# Patient Record
Sex: Female | Born: 1980 | Race: White | Hispanic: No | Marital: Married | Smoking: Former smoker
Health system: Southern US, Community
[De-identification: ages and names within clinical notes are randomized; demographics above are authoritative.]

## PROBLEM LIST (undated history)

## (undated) DIAGNOSIS — K529 Noninfective gastroenteritis and colitis, unspecified: Secondary | ICD-10-CM

## (undated) DIAGNOSIS — R011 Cardiac murmur, unspecified: Secondary | ICD-10-CM

## (undated) DIAGNOSIS — N6310 Unspecified lump in the right breast, unspecified quadrant: Secondary | ICD-10-CM

## (undated) DIAGNOSIS — L723 Sebaceous cyst: Secondary | ICD-10-CM

## (undated) DIAGNOSIS — K219 Gastro-esophageal reflux disease without esophagitis: Secondary | ICD-10-CM

## (undated) DIAGNOSIS — M797 Fibromyalgia: Secondary | ICD-10-CM

## (undated) DIAGNOSIS — L0292 Furuncle, unspecified: Principal | ICD-10-CM

## (undated) HISTORY — PX: COLPOSCOPY: SHX161

## (undated) HISTORY — DX: Furuncle, unspecified: L02.92

## (undated) HISTORY — DX: Fibromyalgia: M79.7

## (undated) HISTORY — DX: Sebaceous cyst: L72.3

## (undated) HISTORY — PX: WISDOM TOOTH EXTRACTION: SHX21

## (undated) HISTORY — PX: TUBAL LIGATION: SHX77

## (undated) HISTORY — DX: Cardiac murmur, unspecified: R01.1

---
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
## 2001-02-13 ENCOUNTER — Emergency Department (HOSPITAL_COMMUNITY): Admission: EM | Admit: 2001-02-13 | Discharge: 2001-02-13 | Payer: Self-pay | Admitting: Emergency Medicine

## 2001-05-22 ENCOUNTER — Emergency Department (HOSPITAL_COMMUNITY): Admission: EM | Admit: 2001-05-22 | Discharge: 2001-05-22 | Payer: Self-pay | Admitting: *Deleted

## 2001-12-22 ENCOUNTER — Emergency Department (HOSPITAL_COMMUNITY): Admission: EM | Admit: 2001-12-22 | Discharge: 2001-12-22 | Payer: Self-pay | Admitting: *Deleted

## 2001-12-22 ENCOUNTER — Encounter: Payer: Self-pay | Admitting: Emergency Medicine

## 2002-04-27 ENCOUNTER — Emergency Department (HOSPITAL_COMMUNITY): Admission: EM | Admit: 2002-04-27 | Discharge: 2002-04-28 | Payer: Self-pay | Admitting: Emergency Medicine

## 2003-03-22 ENCOUNTER — Emergency Department (HOSPITAL_COMMUNITY): Admission: EM | Admit: 2003-03-22 | Discharge: 2003-03-22 | Payer: Self-pay | Admitting: Emergency Medicine

## 2003-03-22 ENCOUNTER — Encounter: Payer: Self-pay | Admitting: Emergency Medicine

## 2003-03-24 ENCOUNTER — Emergency Department (HOSPITAL_COMMUNITY): Admission: EM | Admit: 2003-03-24 | Discharge: 2003-03-24 | Payer: Self-pay | Admitting: Emergency Medicine

## 2003-07-22 ENCOUNTER — Emergency Department (HOSPITAL_COMMUNITY): Admission: EM | Admit: 2003-07-22 | Discharge: 2003-07-22 | Payer: Self-pay | Admitting: Emergency Medicine

## 2003-09-20 ENCOUNTER — Other Ambulatory Visit: Admission: RE | Admit: 2003-09-20 | Discharge: 2003-09-20 | Payer: Self-pay | Admitting: Internal Medicine

## 2003-09-20 ENCOUNTER — Other Ambulatory Visit: Admission: RE | Admit: 2003-09-20 | Discharge: 2003-09-20 | Payer: Self-pay | Admitting: Obstetrics and Gynecology

## 2004-01-14 ENCOUNTER — Emergency Department (HOSPITAL_COMMUNITY): Admission: EM | Admit: 2004-01-14 | Discharge: 2004-01-14 | Payer: Self-pay | Admitting: Emergency Medicine

## 2004-09-15 ENCOUNTER — Emergency Department (HOSPITAL_COMMUNITY): Admission: EM | Admit: 2004-09-15 | Discharge: 2004-09-15 | Payer: Self-pay | Admitting: Emergency Medicine

## 2005-04-23 ENCOUNTER — Other Ambulatory Visit: Admission: RE | Admit: 2005-04-23 | Discharge: 2005-04-23 | Payer: Self-pay | Admitting: Obstetrics & Gynecology

## 2005-10-19 ENCOUNTER — Emergency Department (HOSPITAL_COMMUNITY): Admission: EM | Admit: 2005-10-19 | Discharge: 2005-10-19 | Payer: Self-pay | Admitting: Emergency Medicine

## 2005-10-19 IMAGING — CR DG FOOT COMPLETE 3+V*L*
3 series · 3 of 3 positions shown · non-contrast
Comparison: none

CLINICAL DATA: Shut foot on door.  Pain across top of toes. 
 LEFT [M2] VIEW:
 Three views of the left foot show no acute fracture.  Alignment is normal.

[t foot ap left *]
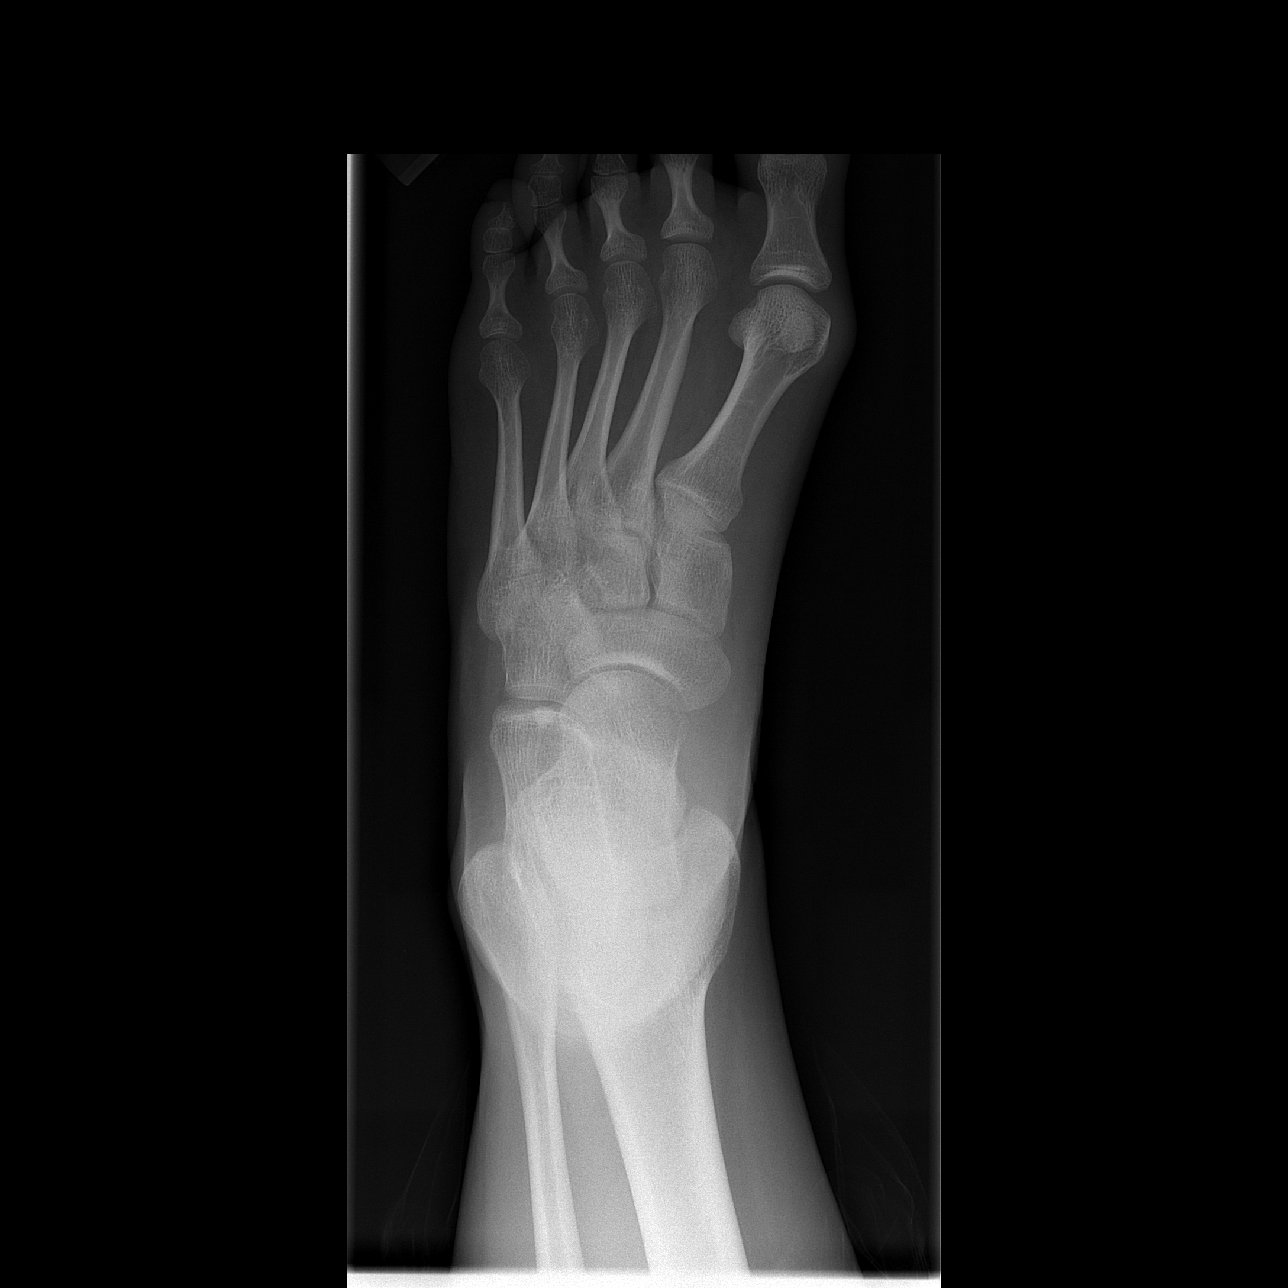

[t foot oblique left]
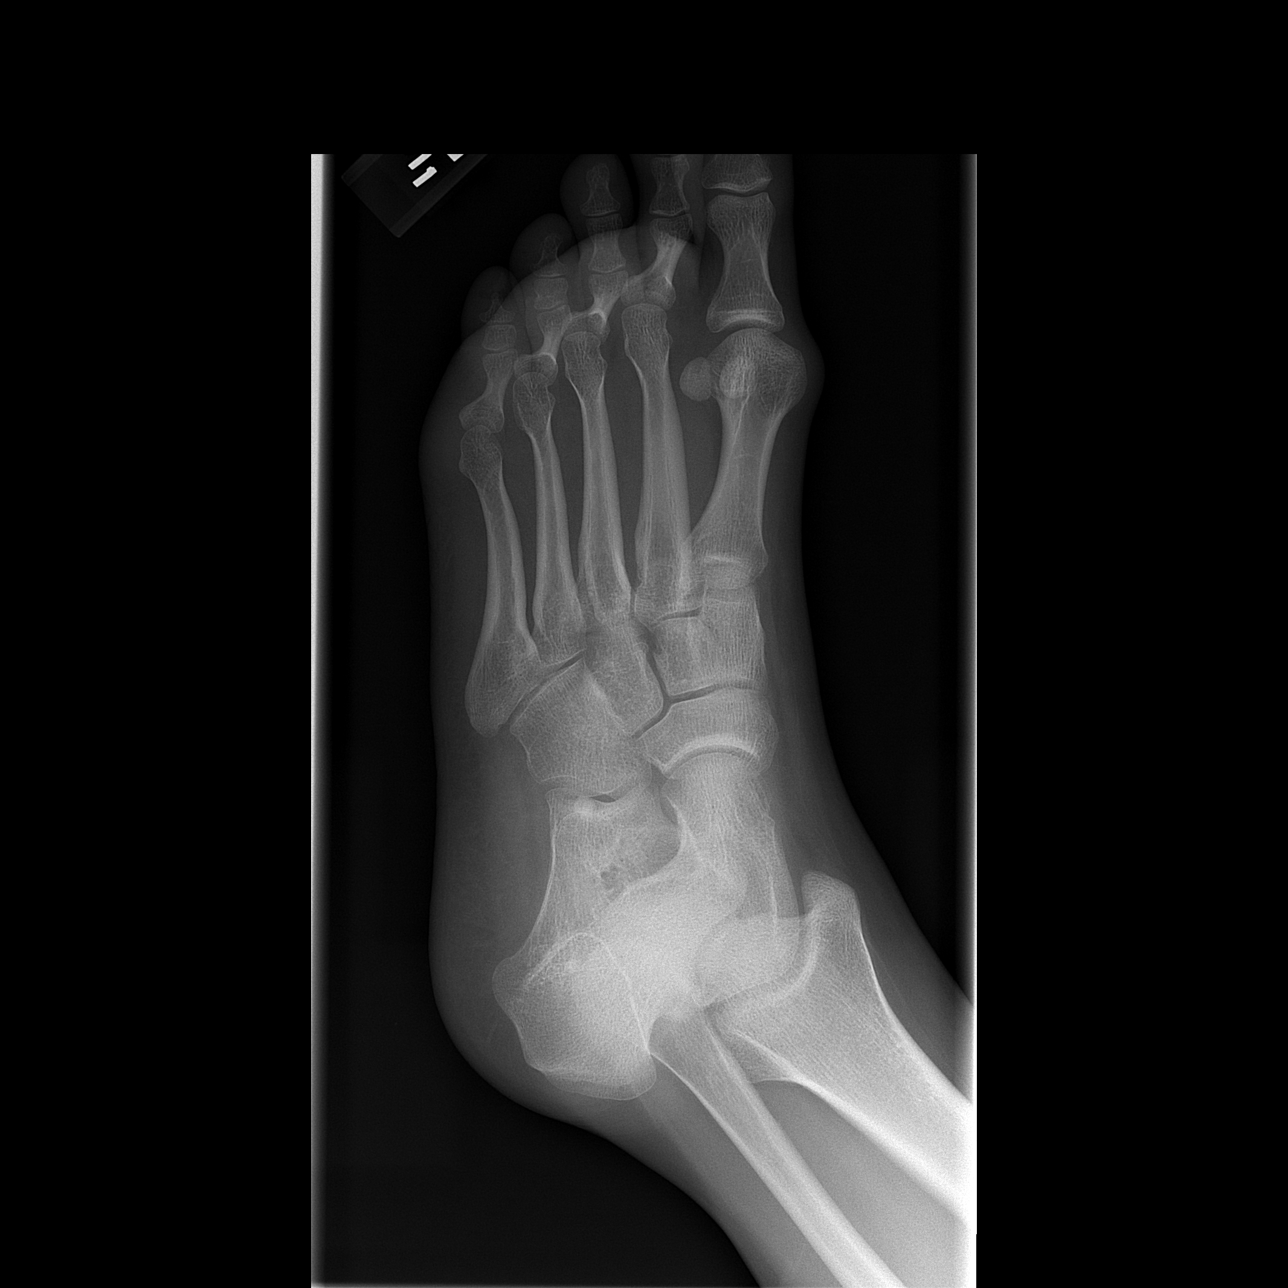

[t foot lat left]
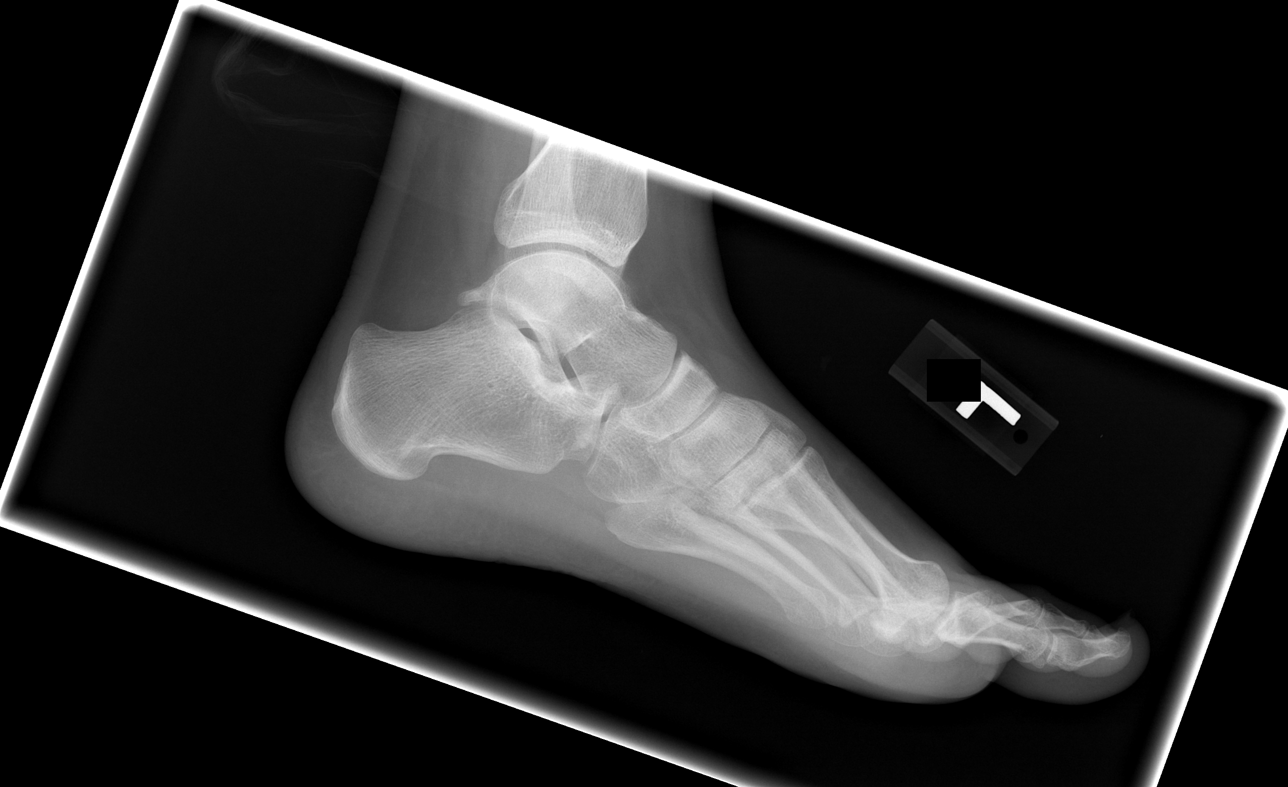

[3 of 3 positions shown; findings below may reference images not displayed]

IMPRESSION: No acute bony abnormality.

## 2006-05-11 ENCOUNTER — Emergency Department (HOSPITAL_COMMUNITY): Admission: EM | Admit: 2006-05-11 | Discharge: 2006-05-11 | Payer: Self-pay | Admitting: Emergency Medicine

## 2006-05-11 IMAGING — CT CT HEAD W/O CM
1 series · 16 of 30 positions shown, 20 images · IV contrast (agent unspecified)
Comparison: none

CLINICAL DATA: Severe headache.  
 HEAD CT WITHOUT CONTRAST:
TECHNIQUE: Contiguous axial images were obtained from the base of the skull through the vertex according to standard protocol without contrast.

[Series 2: head_seq 4.5 h45s st · axial · 0.43mm/px · z∈[+1009,+1135]mm · 16 of 32 slices shown, 20 images]
[im 2/32  brain]
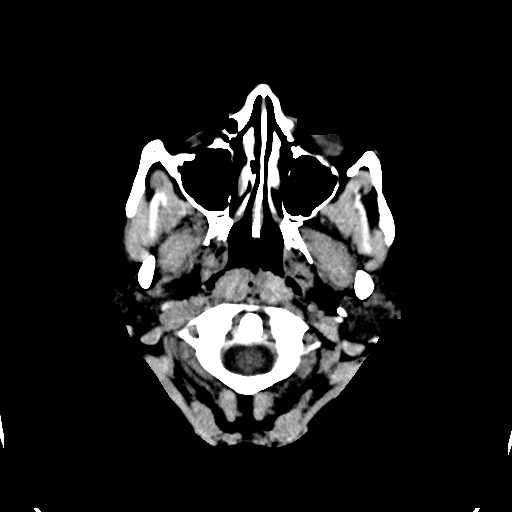
[im 2/32  bone]
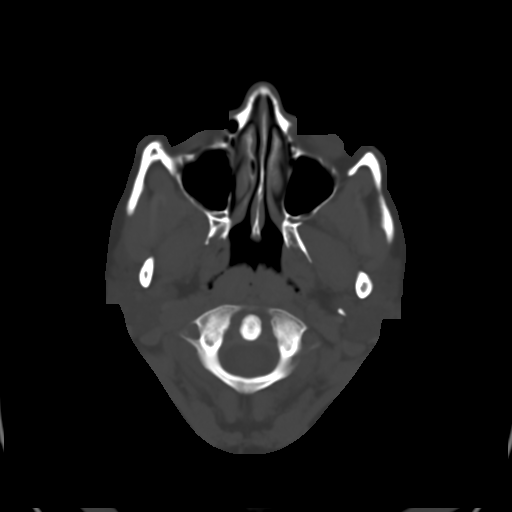
[im 4/32  brain]
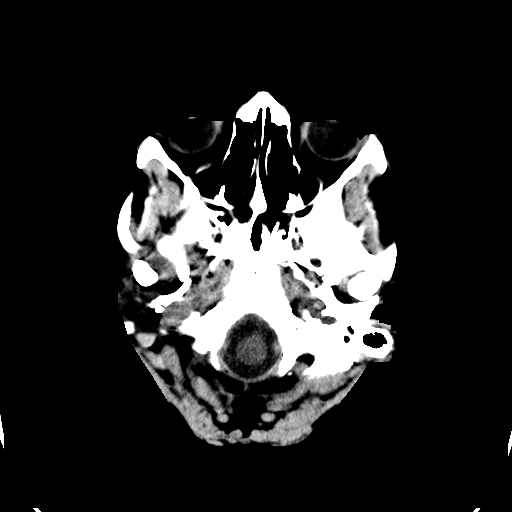
[im 6/32  brain]
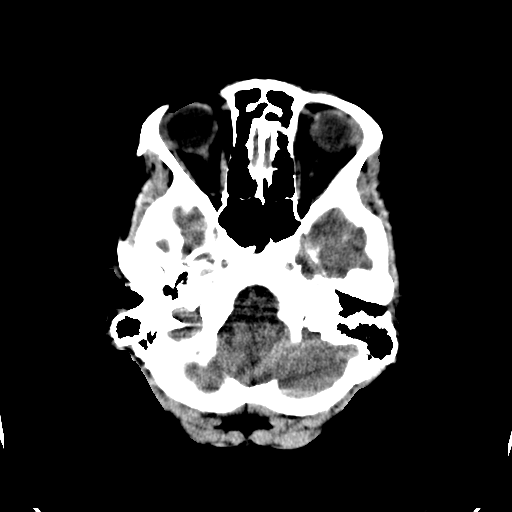
[im 8/32  brain]
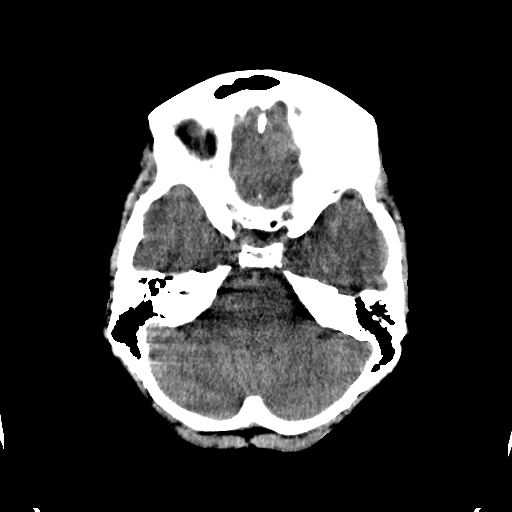
[im 9/32  brain]
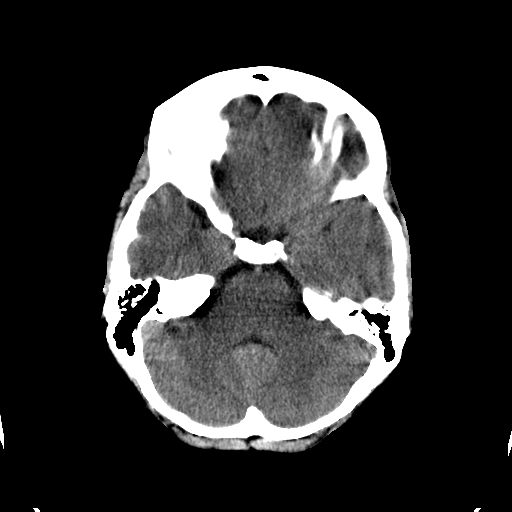
[im 9/32  bone]
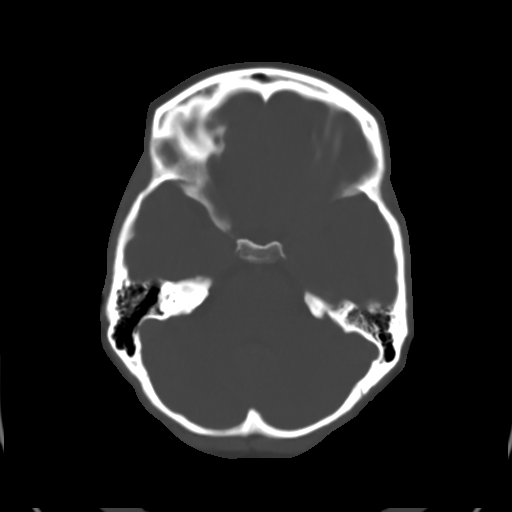
[im 11/32  brain]
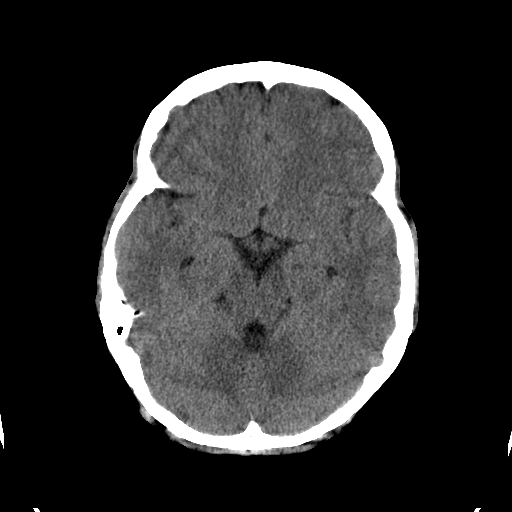
[im 13/32  brain]
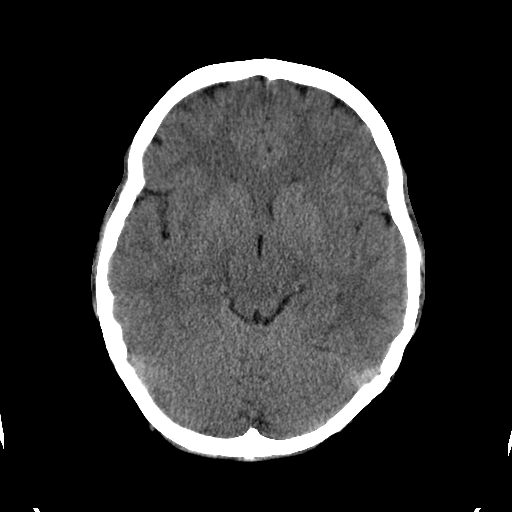
[im 15/32  brain]
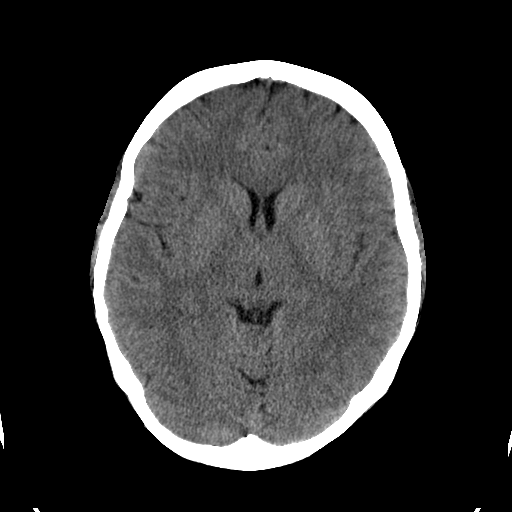
[im 17/32  brain]
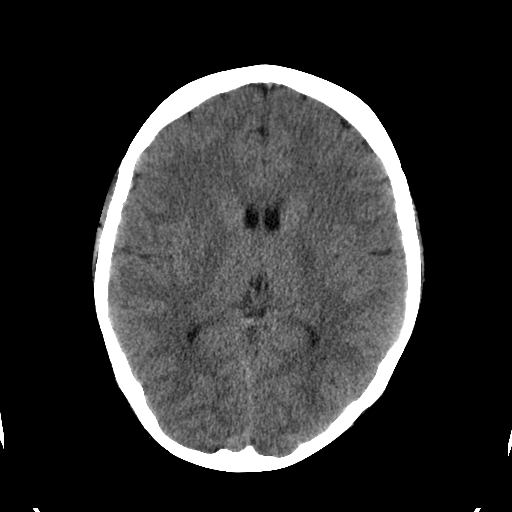
[im 17/32  bone]
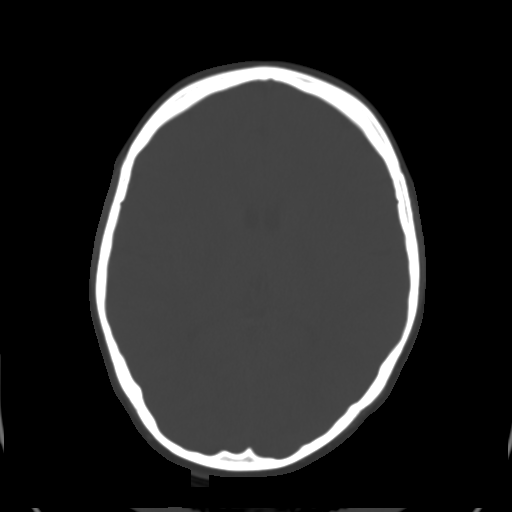
[im 19/32  brain]
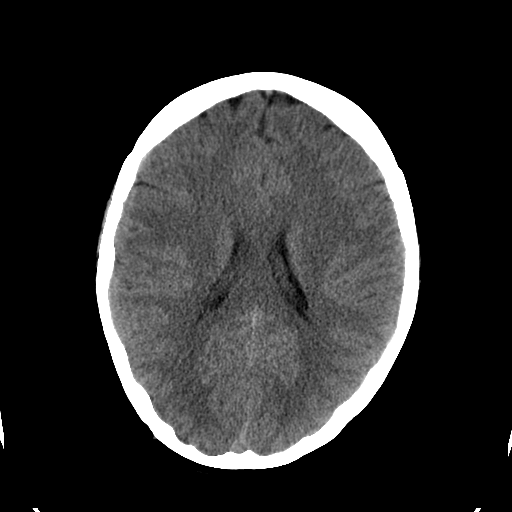
[im 21/32  brain]
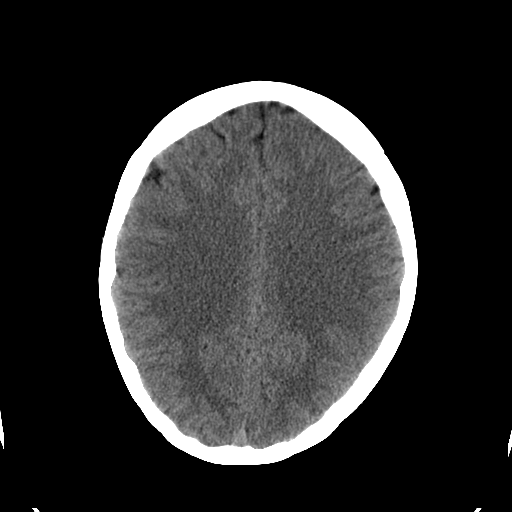
[im 23/32  brain]
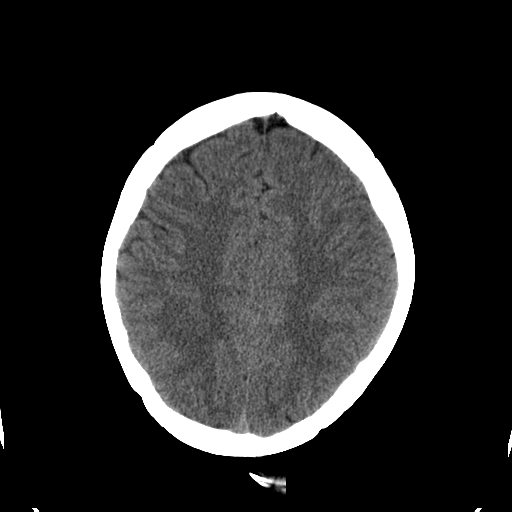
[im 24/32  brain]
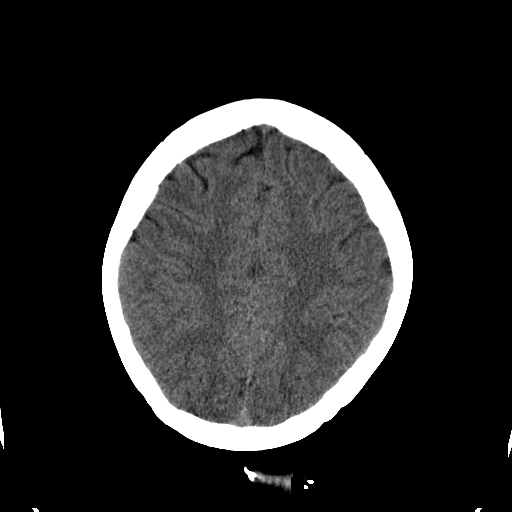
[im 24/32  bone]
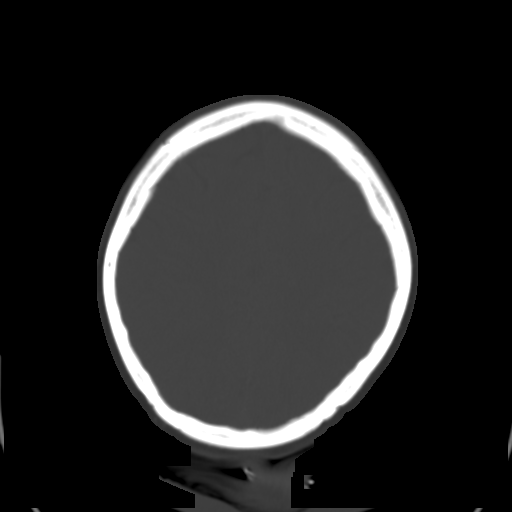
[im 26/32  brain]
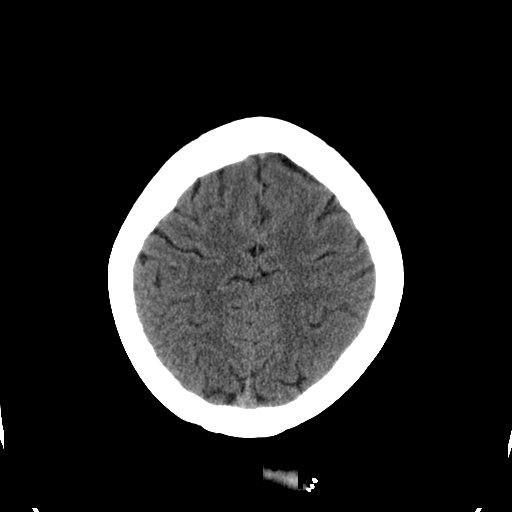
[im 28/32  brain]
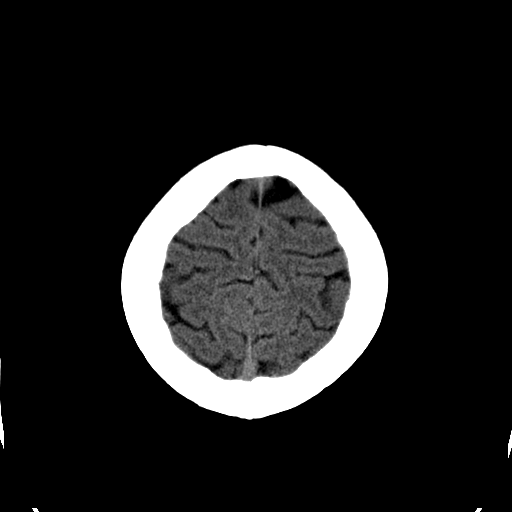
[im 30/32  brain]
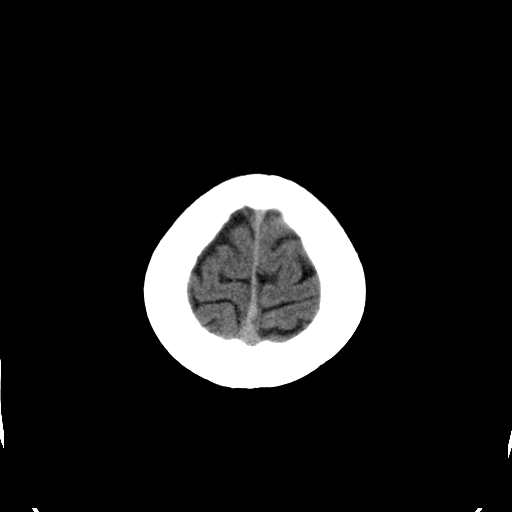

[16 of 30 positions shown; findings below may reference images not displayed]

FINDINGS: There is no evidence of acute intracranial abnormality, including mass or mass effect, hydrocephalus, extraaxial fluid collection, midline shift, hemorrhage, or infarct.  Acute infarct may be occult on CT.  The visualized bony calvarium and paranasal sinuses are unremarkable.
IMPRESSION: Unremarkable CT of the head without contrast.

## 2006-08-02 ENCOUNTER — Emergency Department (HOSPITAL_COMMUNITY): Admission: EM | Admit: 2006-08-02 | Discharge: 2006-08-02 | Payer: Self-pay | Admitting: Emergency Medicine
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
## 2006-08-02 IMAGING — CT CT HEAD W/O CM
1 of 2 series · 16 of 30 positions shown, 20 images · non-contrast
Comparison: [DATE].

CLINICAL DATA: Headache, trauma.
 HEAD CT WITHOUT CONTRAST ? [DATE]:
TECHNIQUE: Contiguous axial images were obtained from the base of the skull through the vertex according to standard protocol without contrast.

[Series 3: recon 2: brain · axial · 0.47mm/px · z∈[+136,+262]mm · 16 of 56 slices shown, 20 images]
[im 3/56  brain]
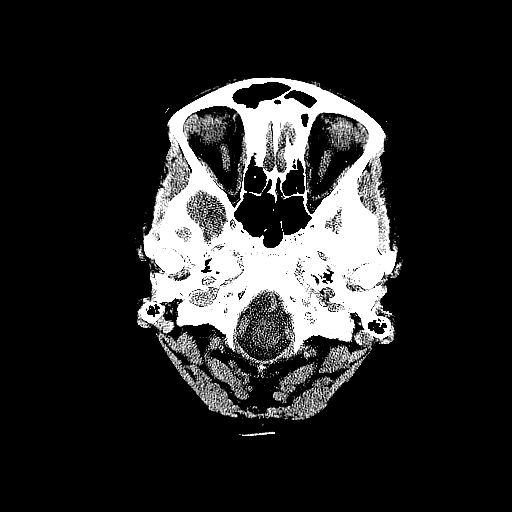
[im 3/56  bone]
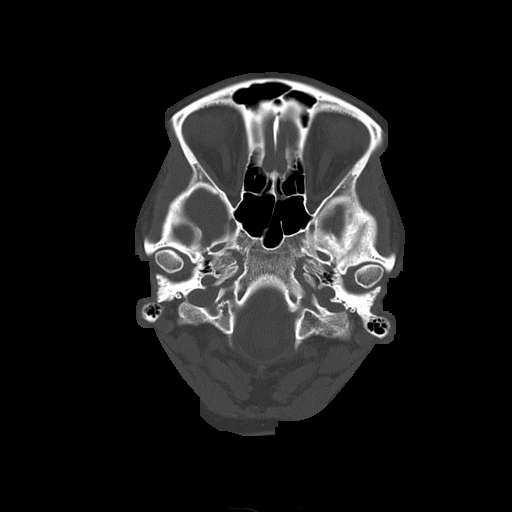
[im 6/56  brain]
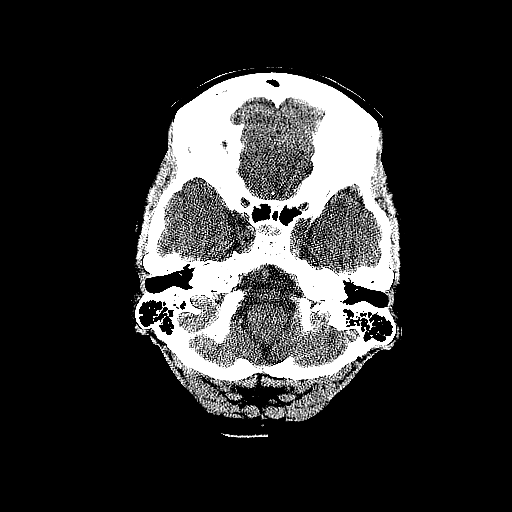
[im 9/56  brain]
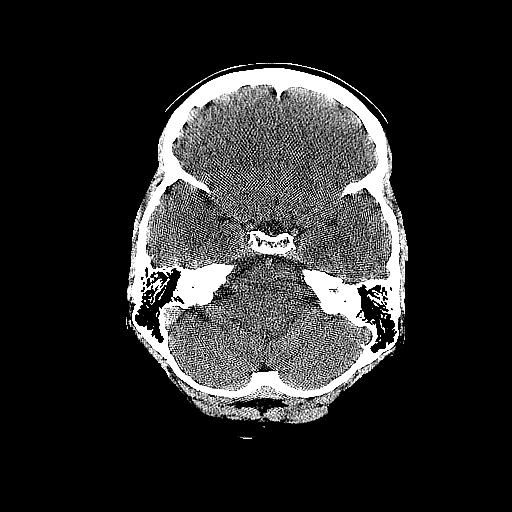
[im 12/56  brain]
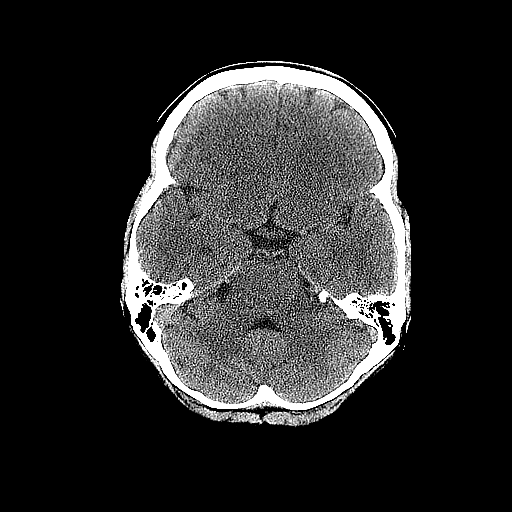
[im 18/56  brain]
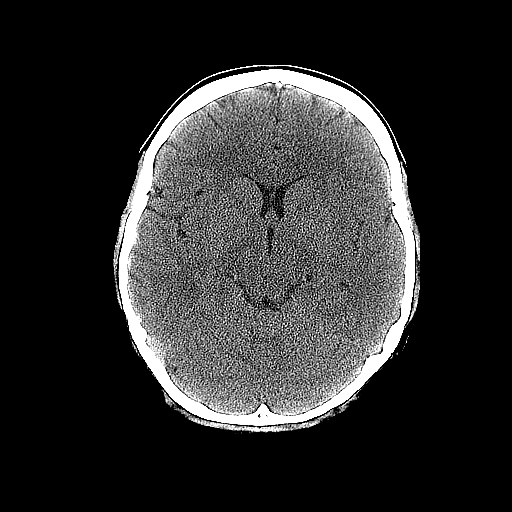
[im 18/56  bone]
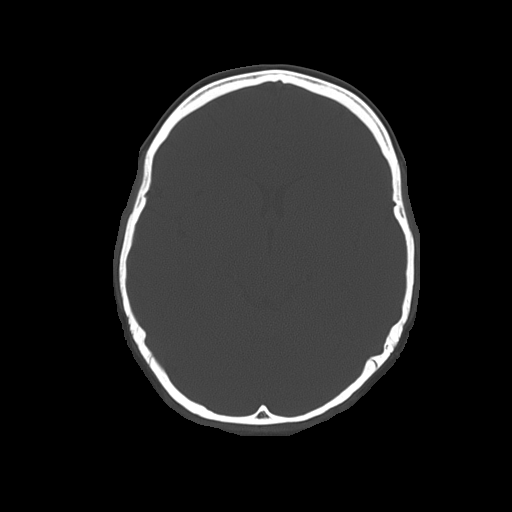
[im 21/56  brain]
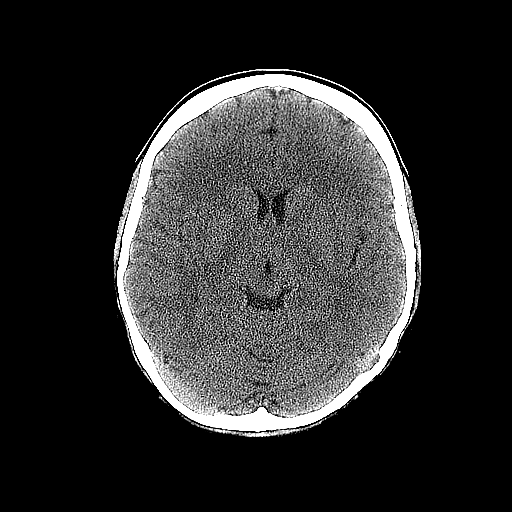
[im 24/56  brain]
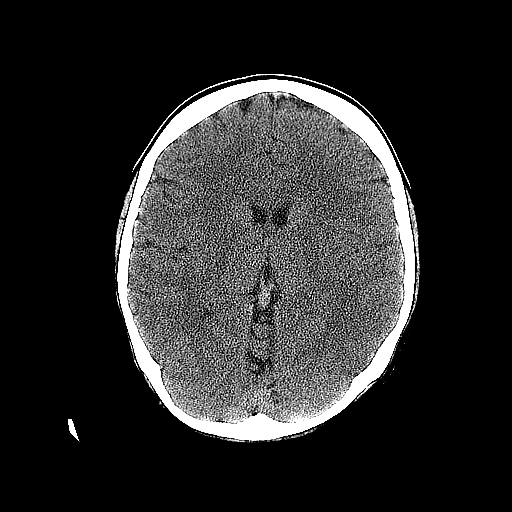
[im 27/56  brain]
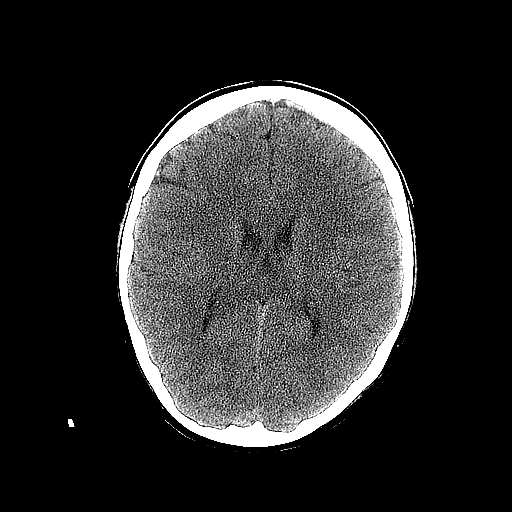
[im 29/56  brain]
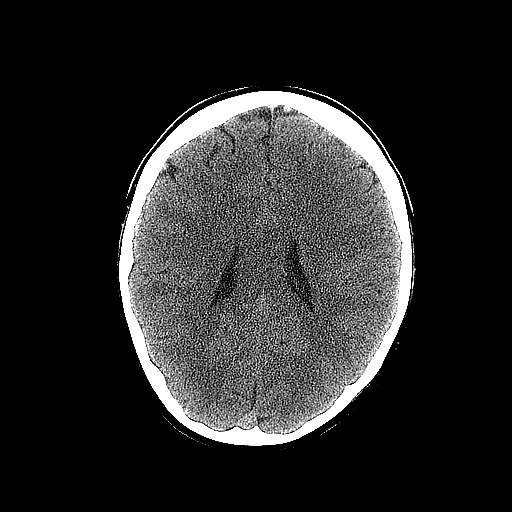
[im 29/56  bone]
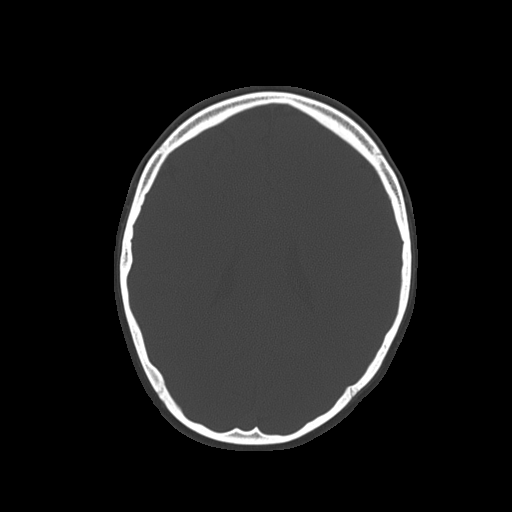
[im 32/56  brain]
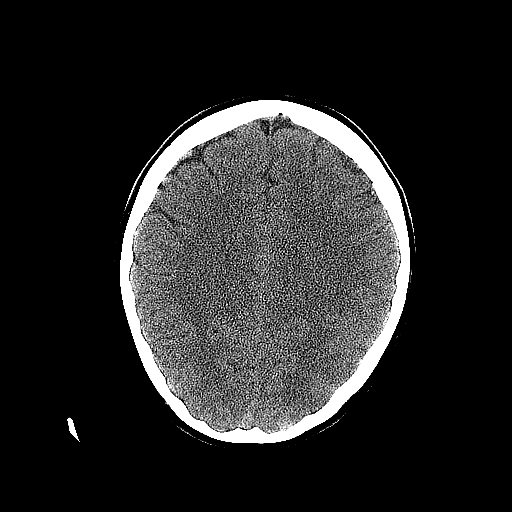
[im 35/56  brain]
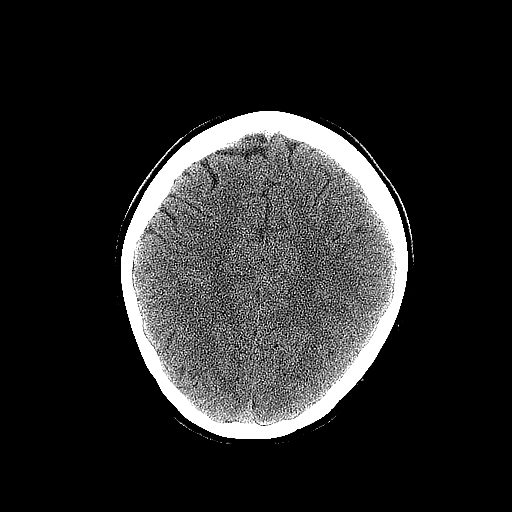
[im 38/56  brain]
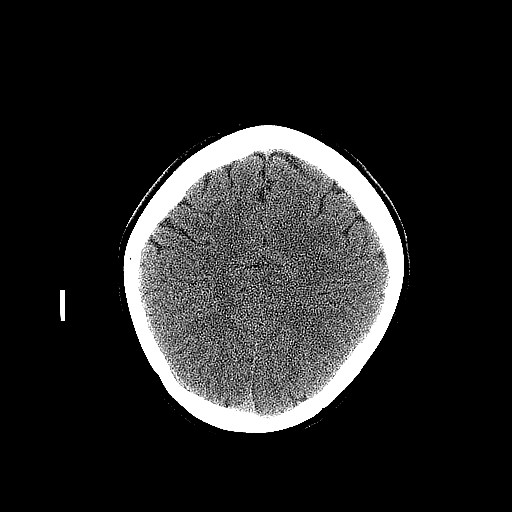
[im 44/56  brain]
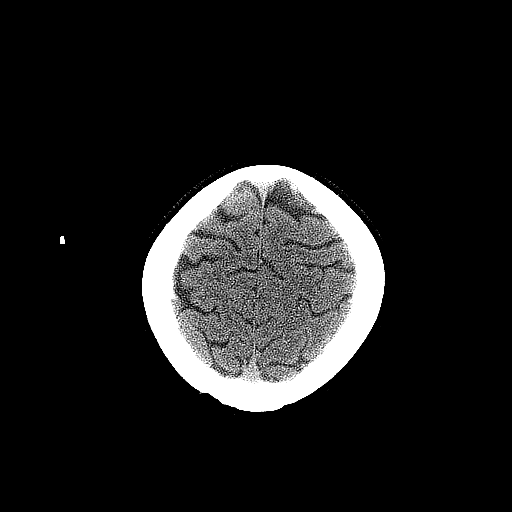
[im 44/56  bone]
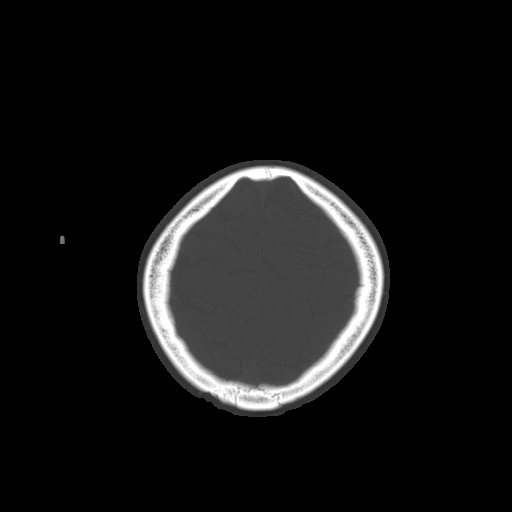
[im 47/56  brain]
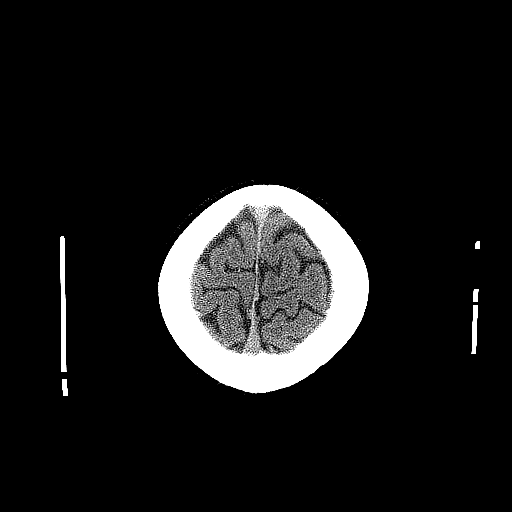
[im 50/56  brain]
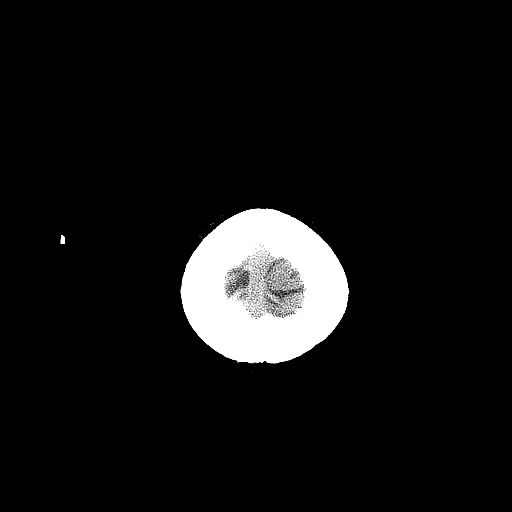
[im 53/56  brain]
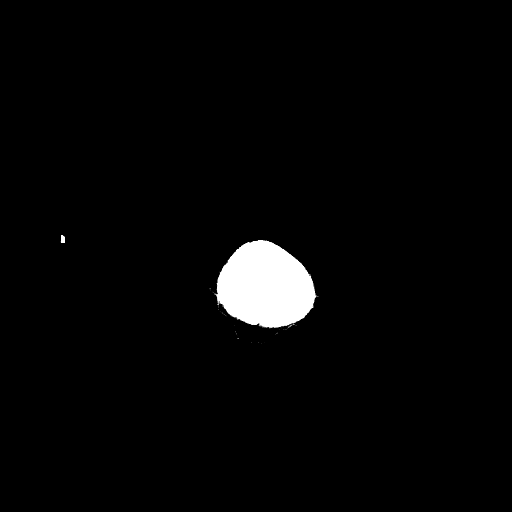

[16 of 30 positions shown; findings below may reference images not displayed]

FINDINGS: Mastoid air cells and sinuses clear.  There is no calvarial or basilar skull fracture appreciated. 
 Cerebral and cerebellar parenchyma symmetric and normal in appearance.  Negative for acute intracranial hemorrhage.
IMPRESSION: CT brain is negative for intracranial hemorrhage or edema.  Negative for fracture.

## 2007-02-26 IMAGING — US US PELVIS COMPLETE
1 series · 14 of 25 positions shown · non-contrast
Comparison: NONE

CLINICAL DATA: Pelvic pain. 

PELVIC ULTRASOUND
TECHNIQUE: Transabdominal and transvaginal images were obtained.

[Series 1: us pelvic · 0.35mm/px · 14 of 50 slices shown]
[im 1/50]
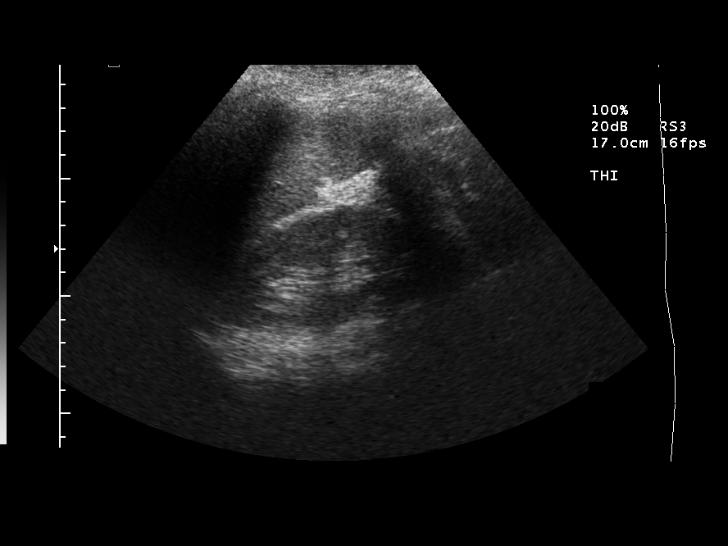
[im 5/50]
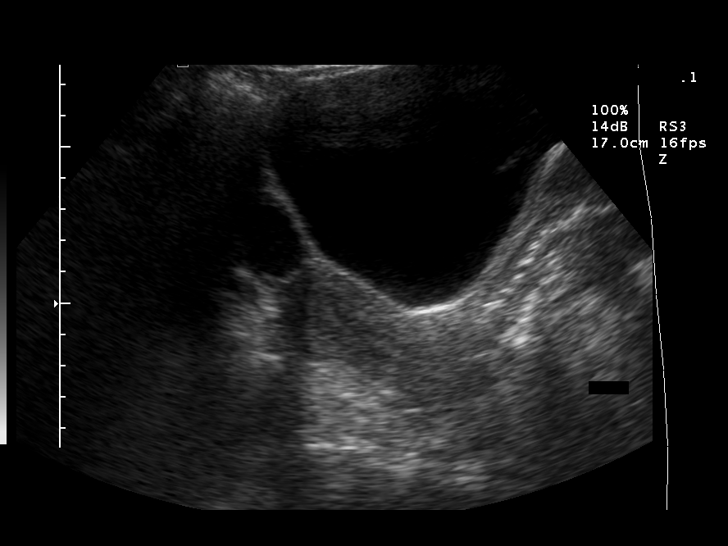
[im 9/50]
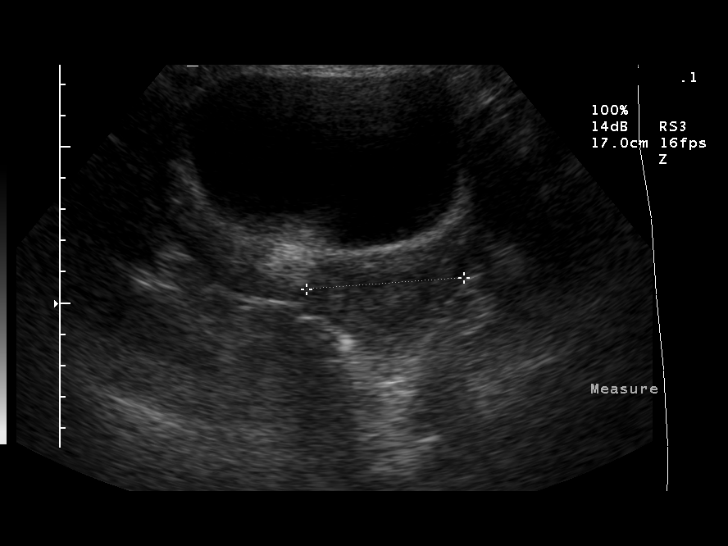
[im 13/50]
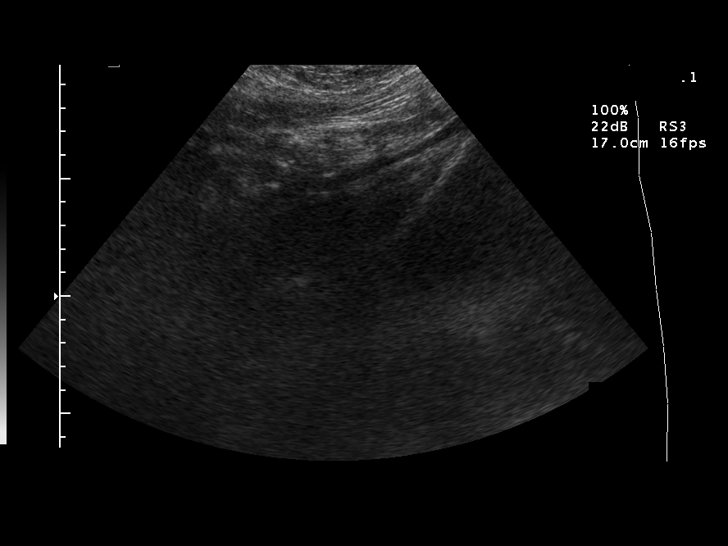
[im 17/50]
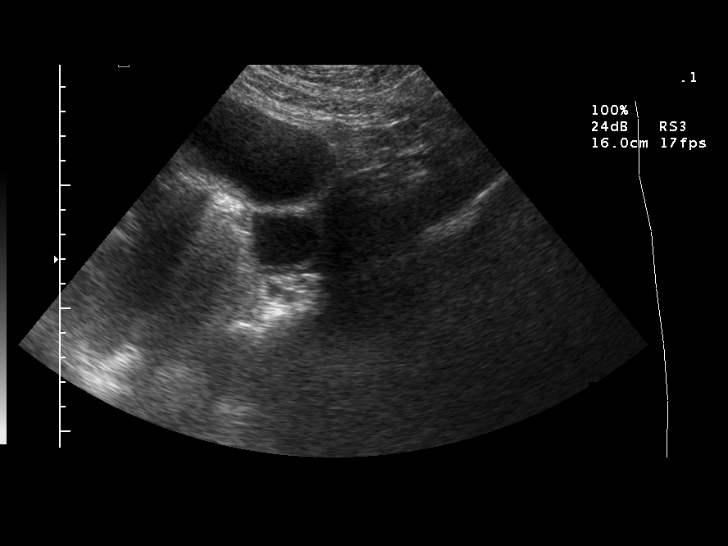
[im 19/50]
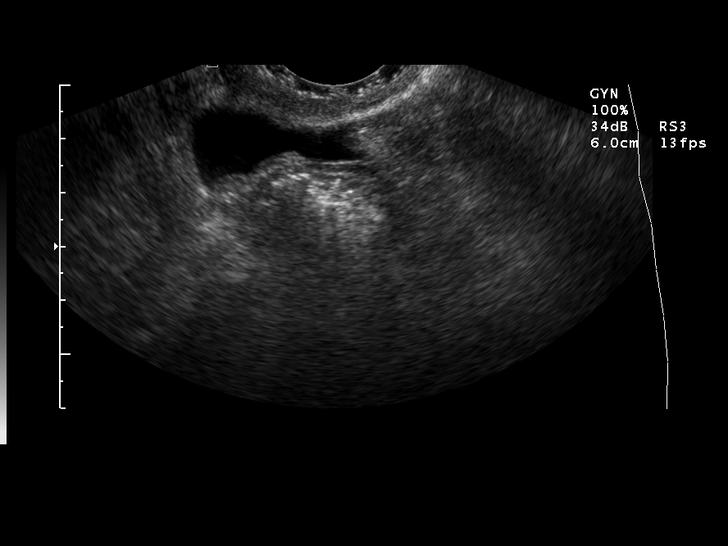
[im 23/50]
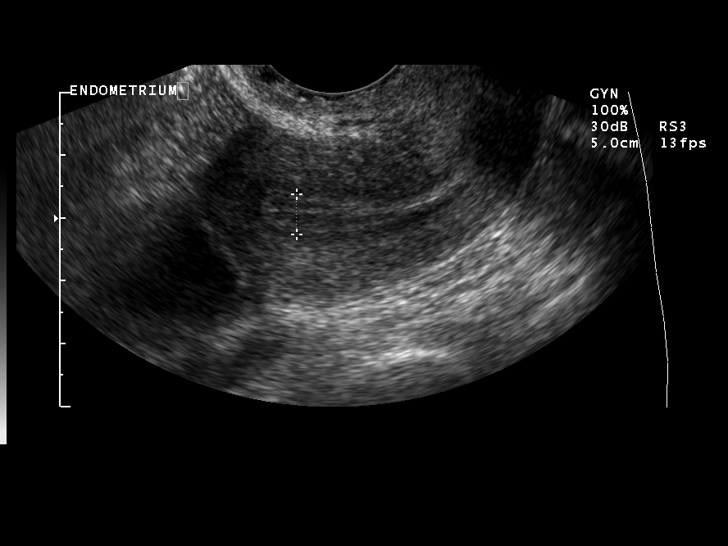
[im 27/50]
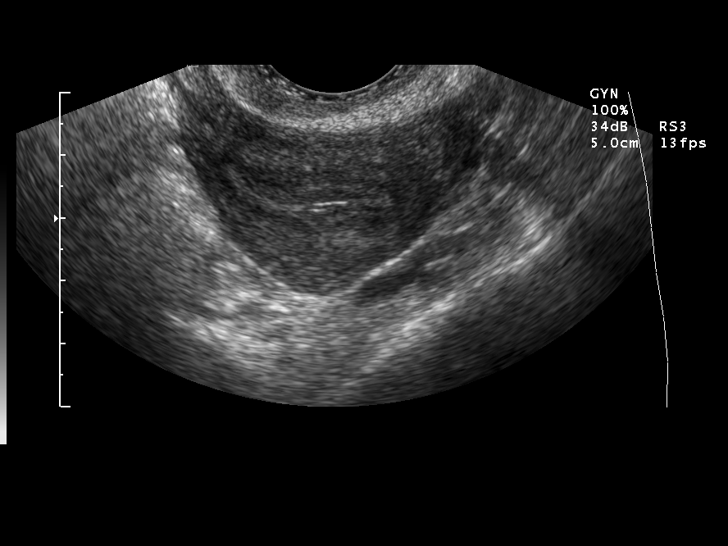
[im 31/50]
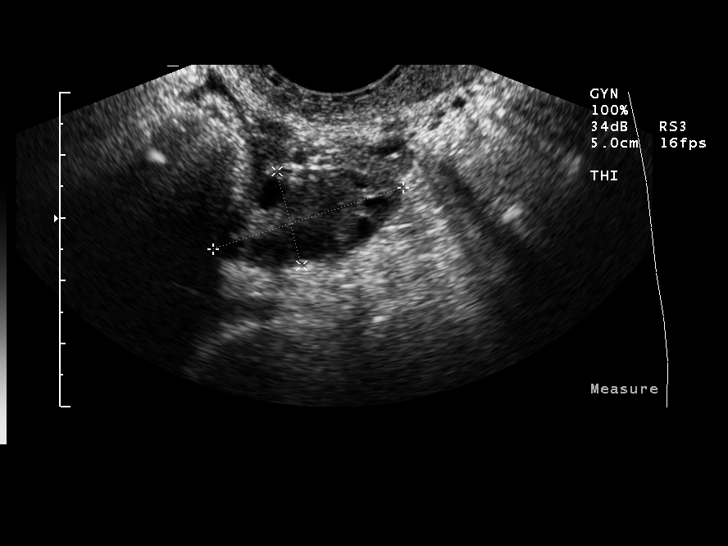
[im 33/50]
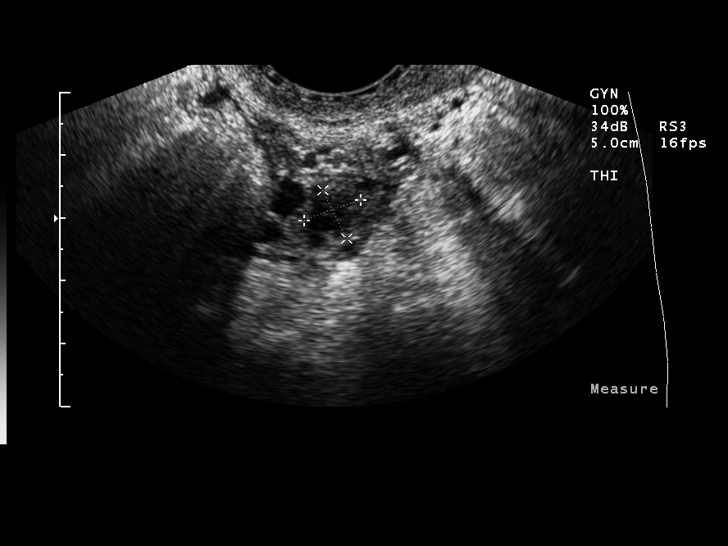
[im 37/50]
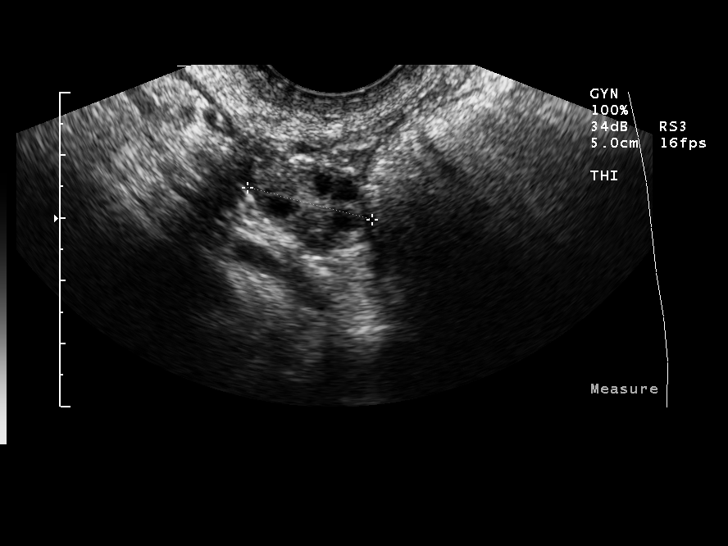
[im 41/50]
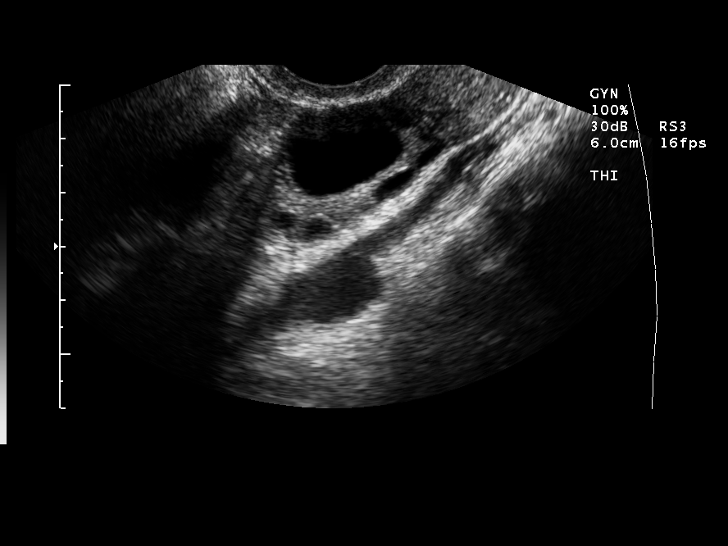
[im 45/50]
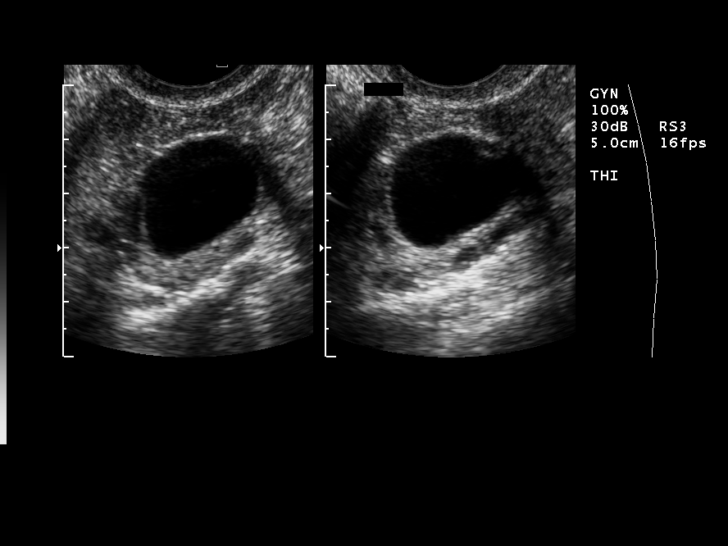
[im 50/50]
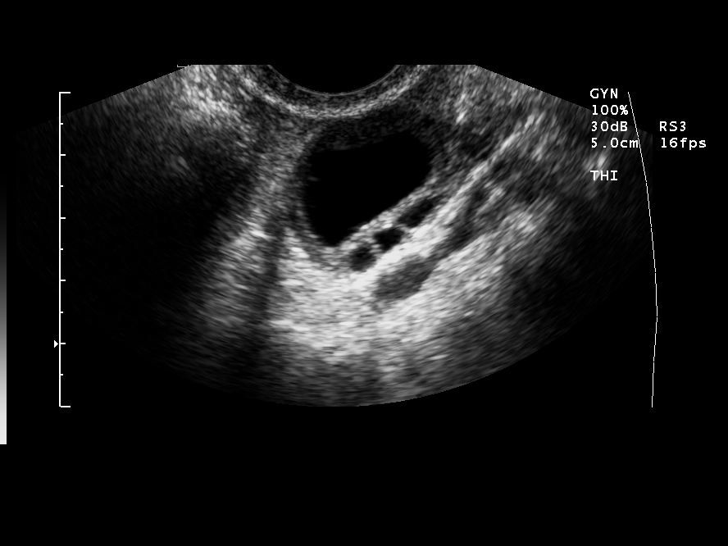

[14 of 25 positions shown; findings below may reference images not displayed]

FINDINGS: The uterus is normal in size measuring 7.4 x 3.1 x
cm. The endometrial stripe measures 6.4 mm. The right ovary 
measures 3.2 x 2.0 x 2.6 cm and contains a probable collapsing 
cyst measuring  10 x 9 x 9 mm. The left ovary measures 4.5 x 3.8 x 
2.5 cm and demonstrates two cystic areas measuring 2.3 x 1.9 x
cm and 1.8 x 1.2 x 0.9 cm compatible with simple-appearing cysts. 
Minimal cul-de-sac fluid likely reflects physiologic findings.
IMPRESSION: Bilateral ovarian cysts, non-complicated and most 
likely benign. Recommend follow-up ultrasound in 3-4 months to 
document resolution of these findings. SCHEEL 
electronically reviewed on [DATE] Dict Date: [DATE]  Tran 
Date: [DATE] DAS  [REDACTED]

## 2007-04-13 ENCOUNTER — Ambulatory Visit (HOSPITAL_COMMUNITY): Admission: RE | Admit: 2007-04-13 | Discharge: 2007-04-13 | Payer: Self-pay | Admitting: Obstetrics & Gynecology

## 2007-05-19 ENCOUNTER — Other Ambulatory Visit: Admission: RE | Admit: 2007-05-19 | Discharge: 2007-05-19 | Payer: Self-pay | Admitting: Obstetrics and Gynecology

## 2007-06-07 ENCOUNTER — Emergency Department (HOSPITAL_COMMUNITY): Admission: EM | Admit: 2007-06-07 | Discharge: 2007-06-08 | Payer: Self-pay | Admitting: Emergency Medicine

## 2007-10-12 ENCOUNTER — Ambulatory Visit: Payer: Self-pay | Admitting: Physician Assistant

## 2007-10-12 ENCOUNTER — Inpatient Hospital Stay (HOSPITAL_COMMUNITY): Admission: AD | Admit: 2007-10-12 | Discharge: 2007-10-12 | Payer: Self-pay | Admitting: Obstetrics & Gynecology

## 2007-11-15 ENCOUNTER — Ambulatory Visit: Payer: Self-pay | Admitting: *Deleted

## 2007-11-15 ENCOUNTER — Inpatient Hospital Stay (HOSPITAL_COMMUNITY): Admission: AD | Admit: 2007-11-15 | Discharge: 2007-11-15 | Payer: Self-pay | Admitting: Obstetrics & Gynecology

## 2007-12-22 ENCOUNTER — Inpatient Hospital Stay (HOSPITAL_COMMUNITY): Admission: AD | Admit: 2007-12-22 | Discharge: 2007-12-23 | Payer: Self-pay | Admitting: Obstetrics and Gynecology

## 2007-12-22 ENCOUNTER — Ambulatory Visit: Payer: Self-pay | Admitting: Obstetrics and Gynecology

## 2007-12-28 ENCOUNTER — Inpatient Hospital Stay (HOSPITAL_COMMUNITY): Admission: AD | Admit: 2007-12-28 | Discharge: 2007-12-30 | Payer: Self-pay | Admitting: Obstetrics & Gynecology

## 2007-12-28 ENCOUNTER — Ambulatory Visit: Payer: Self-pay | Admitting: *Deleted

## 2008-04-04 ENCOUNTER — Emergency Department (HOSPITAL_COMMUNITY): Admission: EM | Admit: 2008-04-04 | Discharge: 2008-04-04 | Payer: Self-pay | Admitting: Emergency Medicine
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
## 2008-04-17 ENCOUNTER — Emergency Department (HOSPITAL_COMMUNITY): Admission: EM | Admit: 2008-04-17 | Discharge: 2008-04-17 | Payer: Self-pay | Admitting: Emergency Medicine

## 2008-04-17 IMAGING — CR DG STERNUM 2+V
1 series · 1 of 1 positions shown · non-contrast
Comparison: No priors

CLINICAL DATA: Chest wall pain

STERNUM - 2+ VIEW

[w sternum lat]
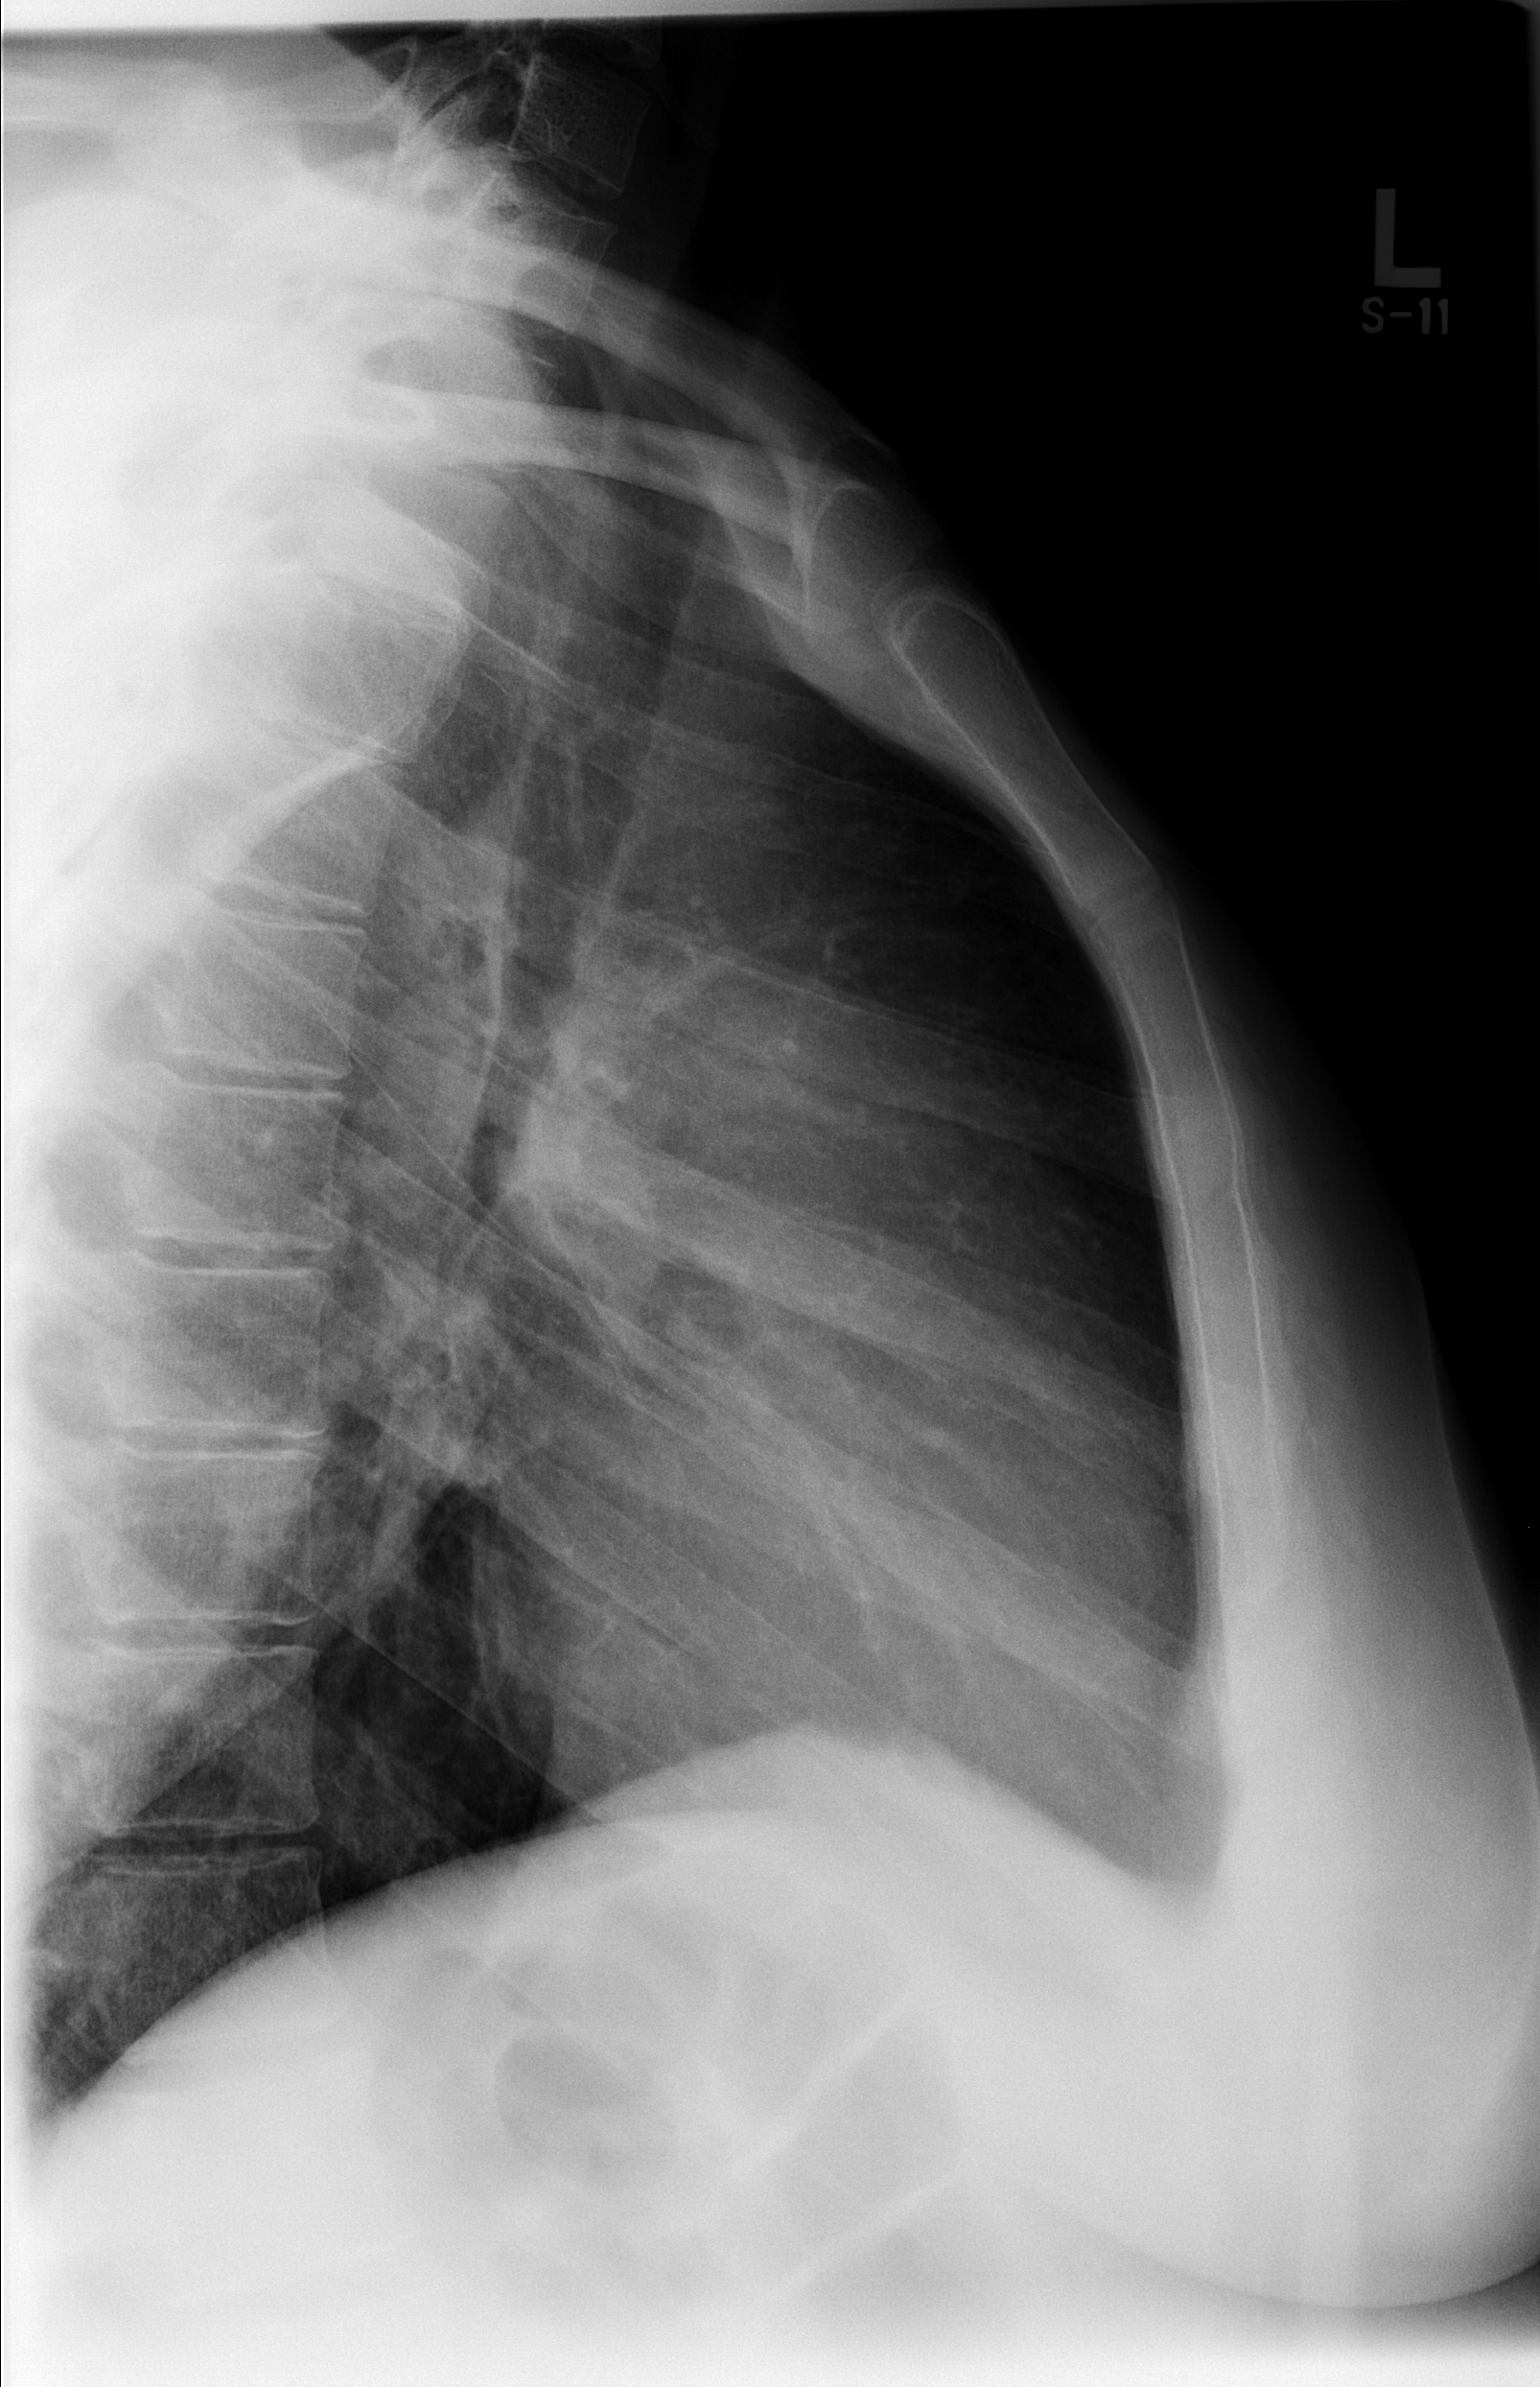

[1 of 1 positions shown; findings below may reference images not displayed]

FINDINGS: A coned-down lateral view of the sternum shows no
fracture, lesions, or soft tissue swelling.
IMPRESSION: No acute or significant findings.

## 2008-04-17 IMAGING — CR DG CHEST 2V
2 series · 2 of 2 positions shown · non-contrast
Comparison: [DATE]

CLINICAL DATA: Chest pain

CHEST - 2 VIEW

[w chest pa]
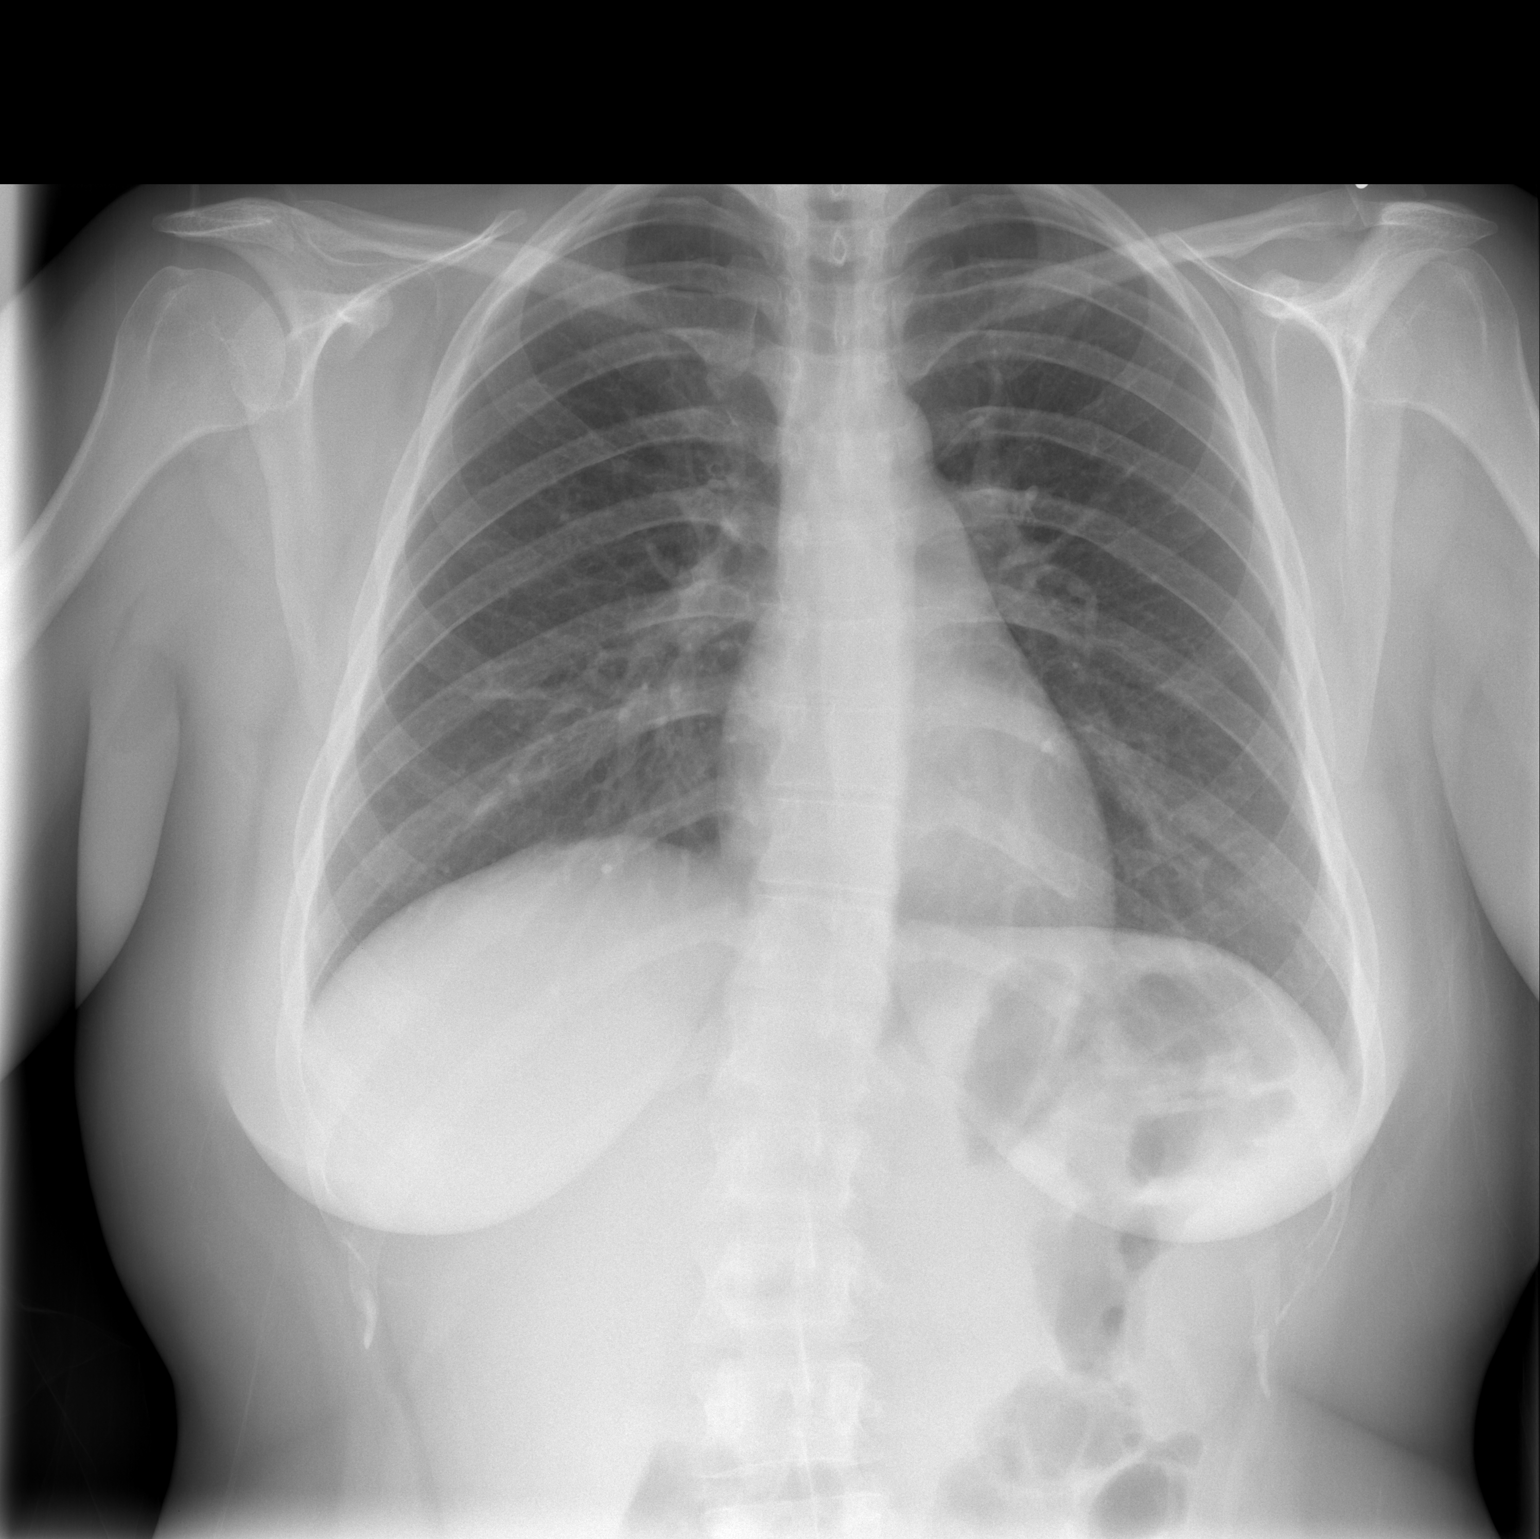

[w chest lat]
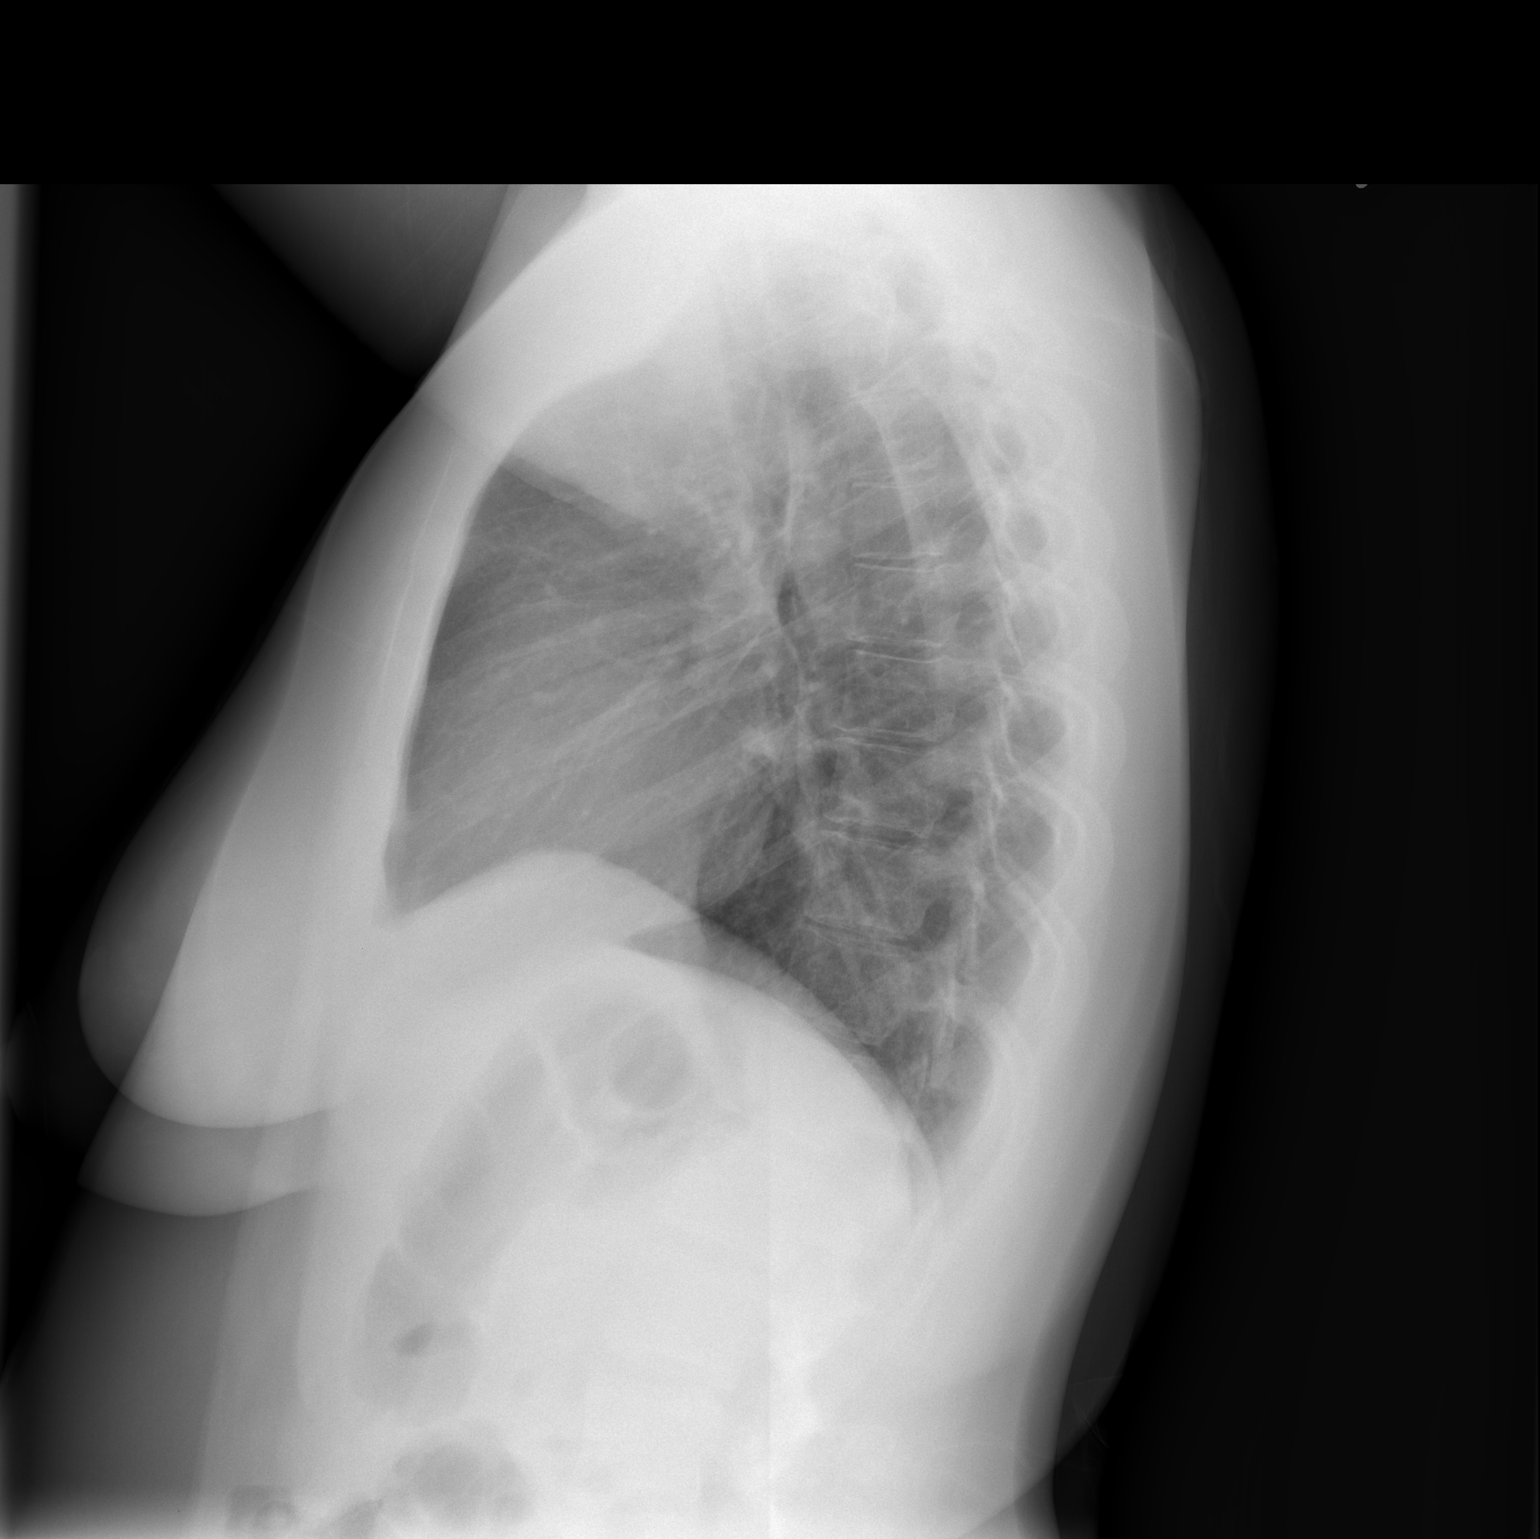

[2 of 2 positions shown; findings below may reference images not displayed]

FINDINGS: Mediastinal contours normal.  Lungs clear.  No pleural
fluid.  Mild biconcave scoliosis.
IMPRESSION: No active disease.

## 2008-10-15 ENCOUNTER — Emergency Department (HOSPITAL_COMMUNITY): Admission: EM | Admit: 2008-10-15 | Discharge: 2008-10-16 | Payer: Self-pay | Admitting: Emergency Medicine

## 2009-01-25 ENCOUNTER — Inpatient Hospital Stay (HOSPITAL_COMMUNITY): Admission: AD | Admit: 2009-01-25 | Discharge: 2009-01-28 | Payer: Self-pay | Admitting: Obstetrics & Gynecology

## 2009-01-25 IMAGING — US US PELVIS COMPLETE MODIFY
1 series · 13 of 25 positions shown · non-contrast
Comparison: None

CLINICAL DATA: Left lower quadrant pain.  Pelvic inflammatory
disease.

TRANSABDOMINAL AND TRANSVAGINAL ULTRASOUND OF PELVIS
TECHNIQUE: Both transabdominal and transvaginal ultrasound
examinations of the pelvis were performed including evaluation of
the uterus, ovaries, adnexal regions, and pelvic cul-de-sac.

[Series 1: us transvaginal non-ob · 13 of 33 slices shown]
[im 1/33]
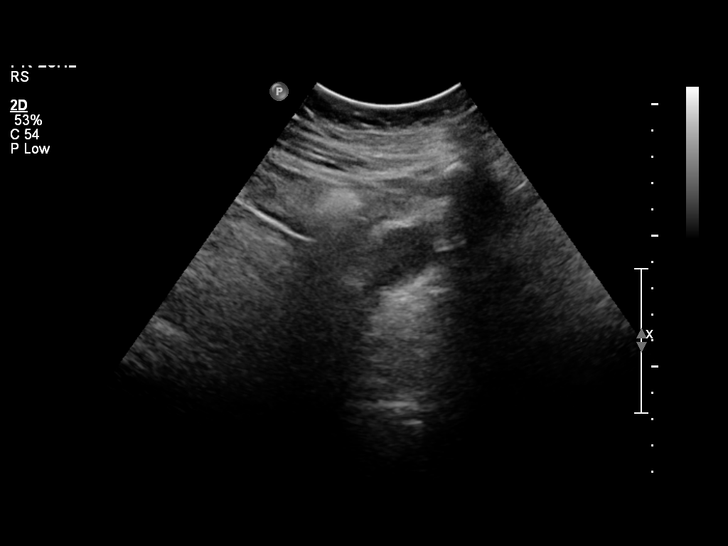
[im 3/33]
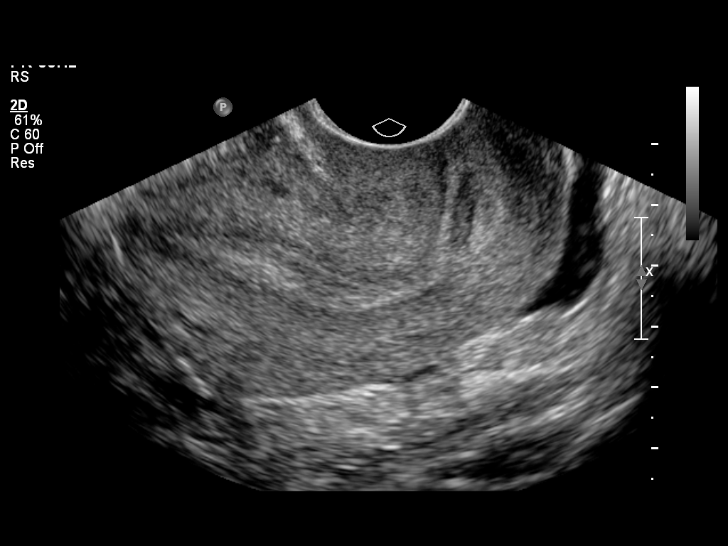
[im 6/33]
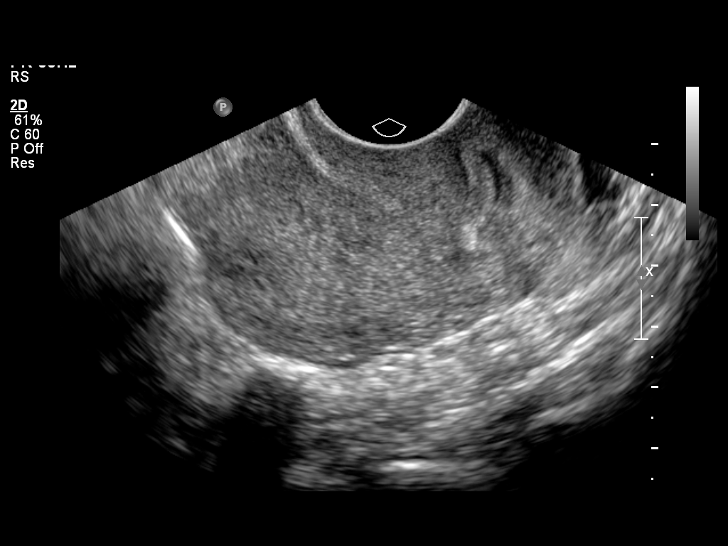
[im 9/33]
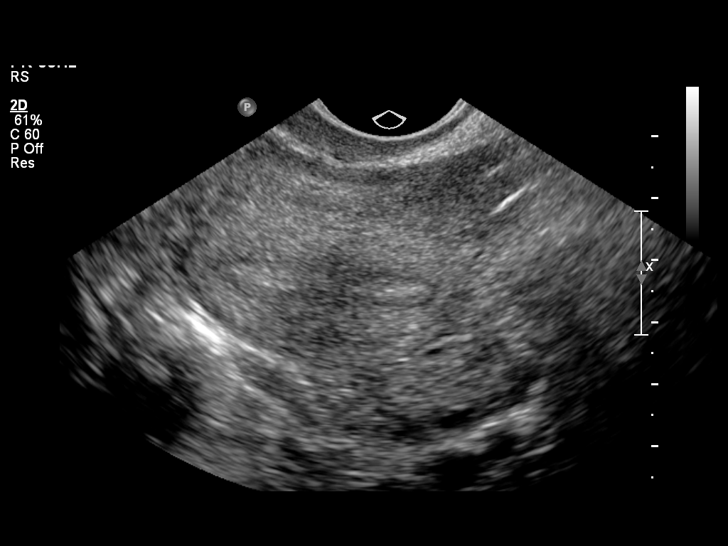
[im 11/33]
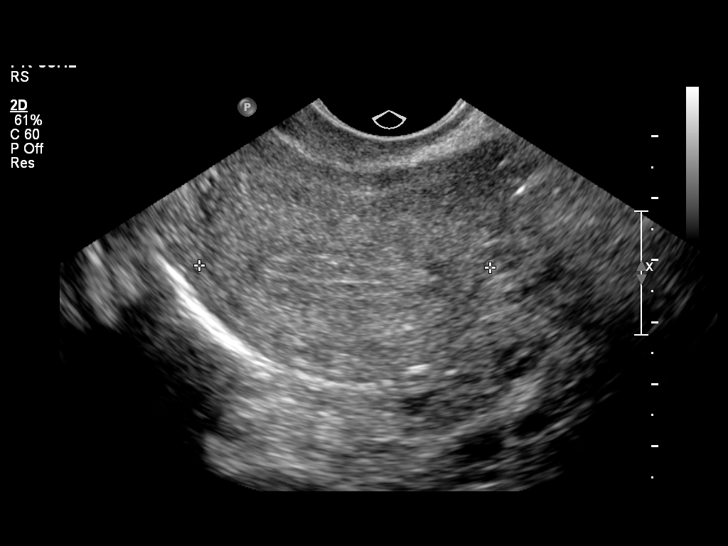
[im 14/33]
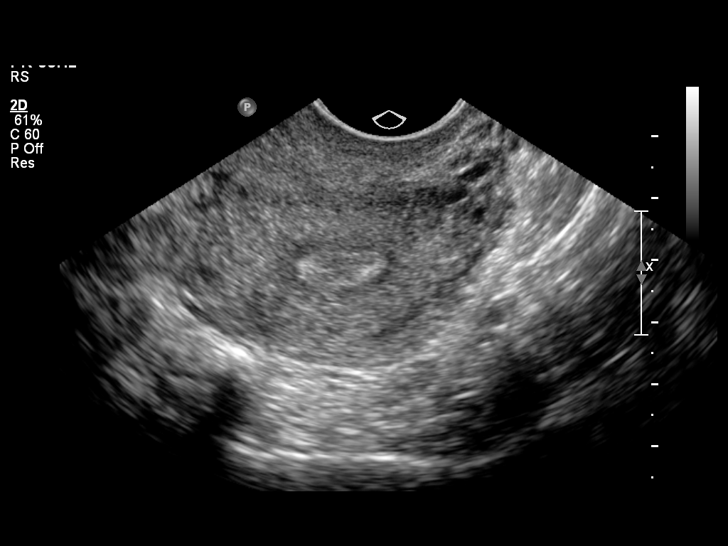
[im 17/33]
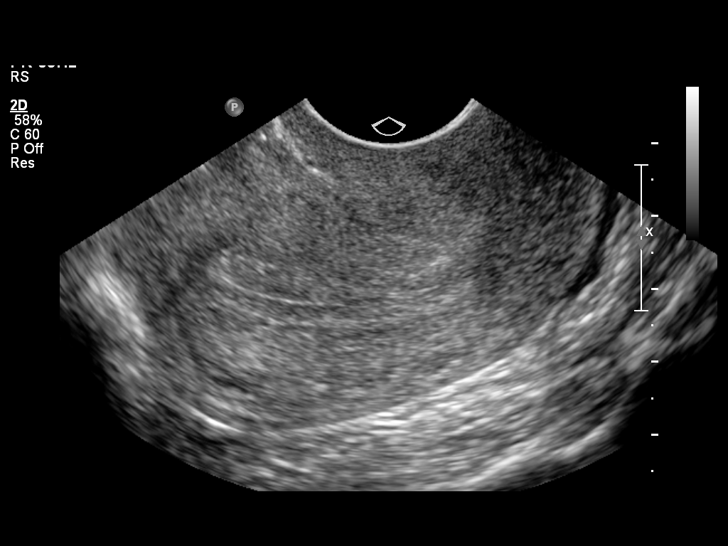
[im 19/33]
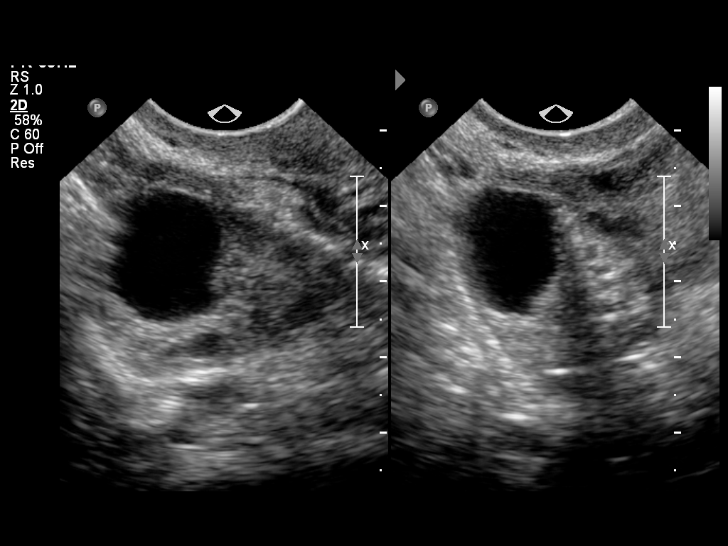
[im 22/33]
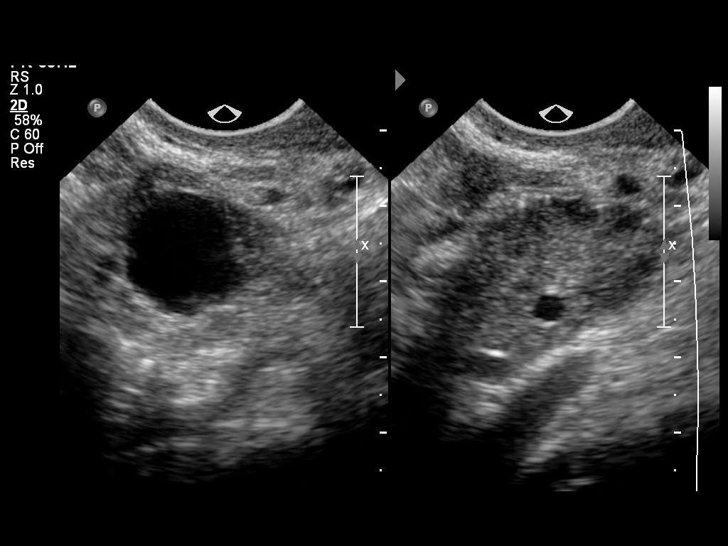
[im 25/33]
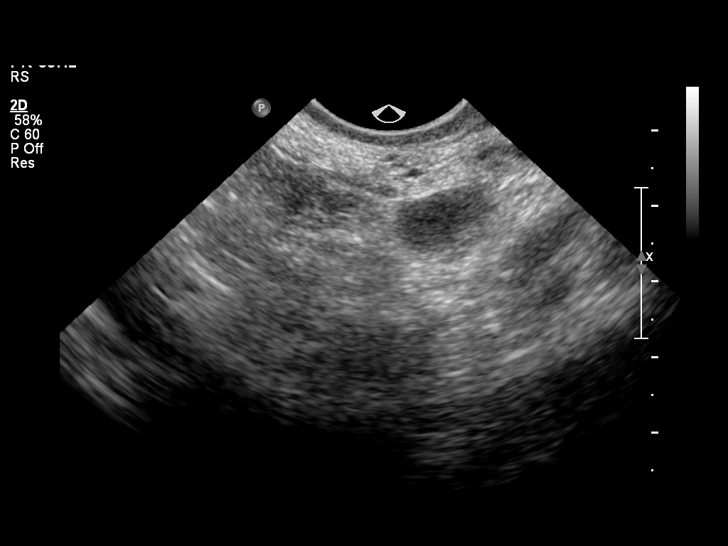
[im 27/33]
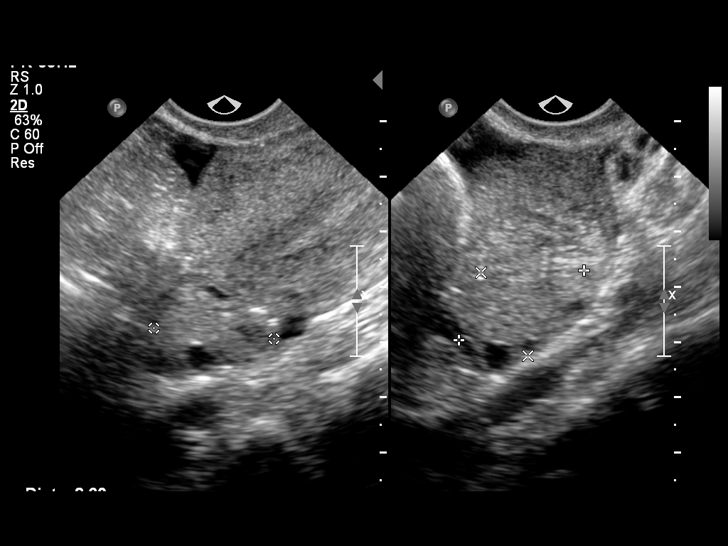
[im 30/33]
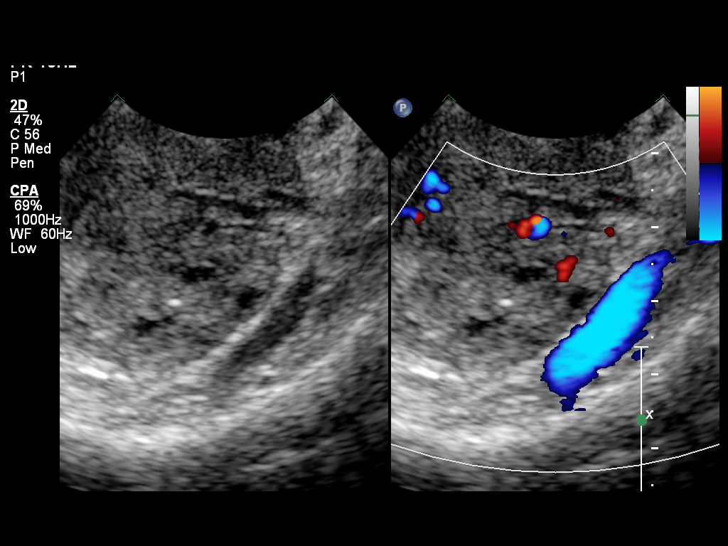
[im 33/33]
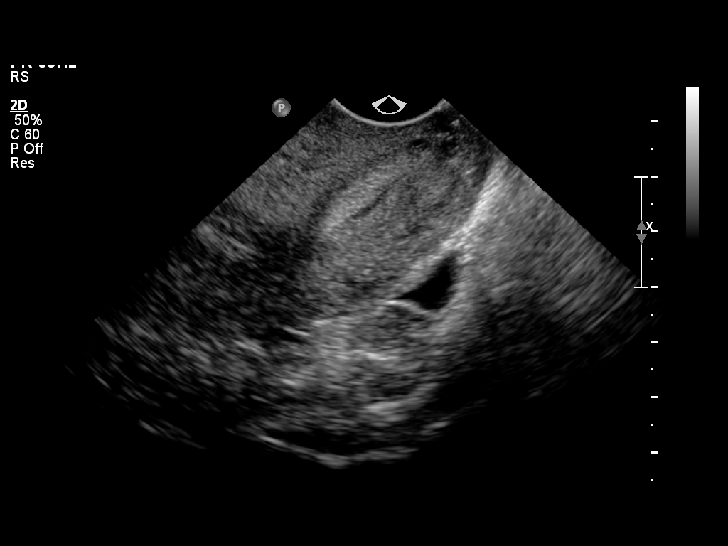

[13 of 25 positions shown; findings below may reference images not displayed]

FINDINGS: The uterus measures 7.8 cm along the endometrial stripe.

The endometrium measures 7.6 cm in thickness.  No fluid is
identified in the endometrial canal, and no myometrial masses
identified.

The right ovary measures 3.3 x 2.6 x 1.7 cm and contains a 1.7 x
1.4 cm anechoic lesion compatible cyst.  The left ovary measures
2.6 x 1.7 x 2.2 cm and appears normal.

There is a small amount of free fluid in the pelvis, best seen in
the cul-de-sac.
IMPRESSION: 1.  Small amount of free pelvic fluid.
2.  Right ovarian cyst. This does not have the thick rind or
complex internal appearance than I would expect of a tubo-ovarian
abscess.  However, if the patient's condition is refractory to
conservative therapy, then reimaging may be warranted.
3.  The endometrium measures 8 mm in thickness, without identified
fluid in the endometrial canal.

## 2009-04-18 ENCOUNTER — Ambulatory Visit: Payer: Self-pay | Admitting: Obstetrics and Gynecology

## 2009-04-19 ENCOUNTER — Ambulatory Visit (HOSPITAL_COMMUNITY): Admission: RE | Admit: 2009-04-19 | Discharge: 2009-04-19 | Payer: Self-pay | Admitting: Family Medicine

## 2009-04-19 IMAGING — US US RENAL
1 series · 13 of 25 positions shown · non-contrast
Comparison: None

CLINICAL DATA: Right flank pain.  Question renal stones

RENAL/URINARY TRACT ULTRASOUND COMPLETE

[Series 1: us transvaginal non-ob · 0.12mm/px · 13 of 33 slices shown]
[im 1/33]
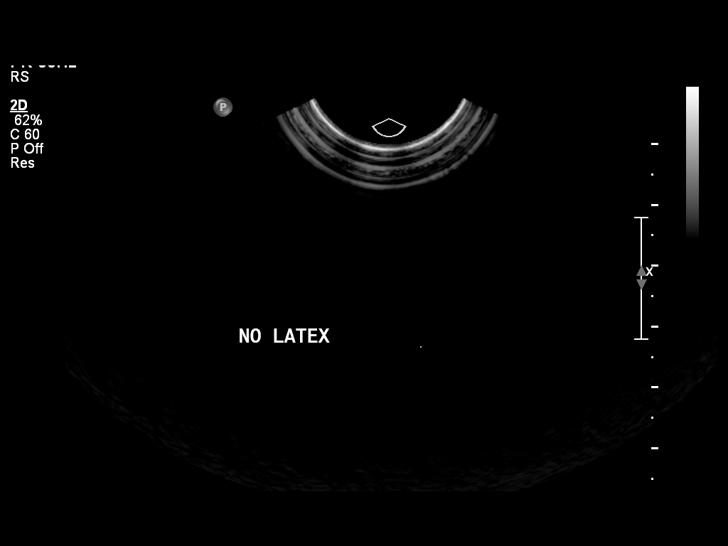
[im 3/33]
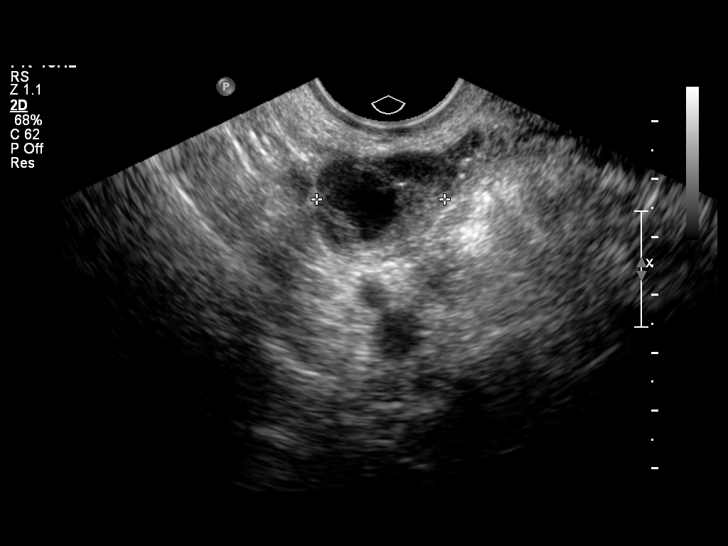
[im 6/33]
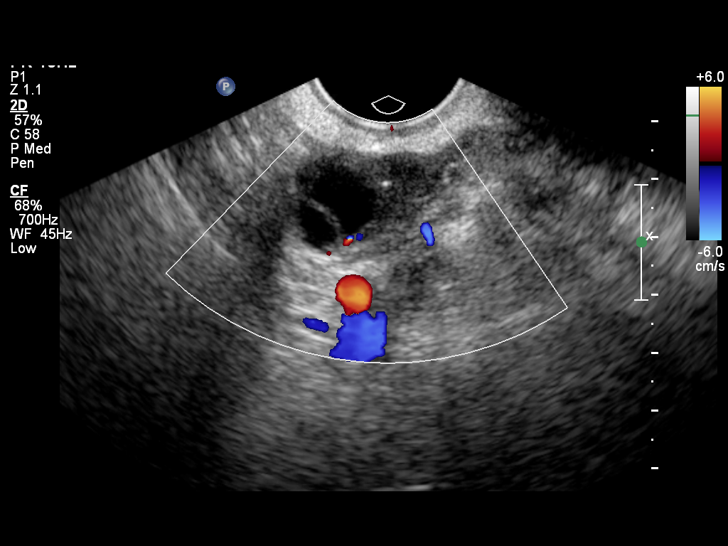
[im 9/33]
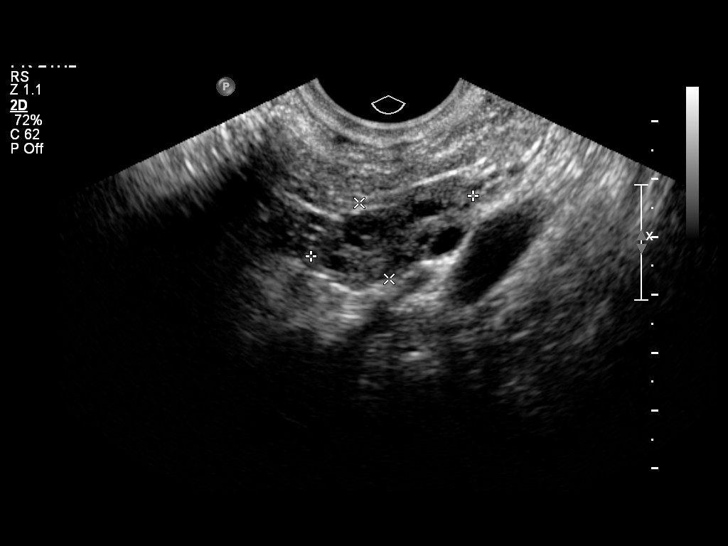
[im 11/33]
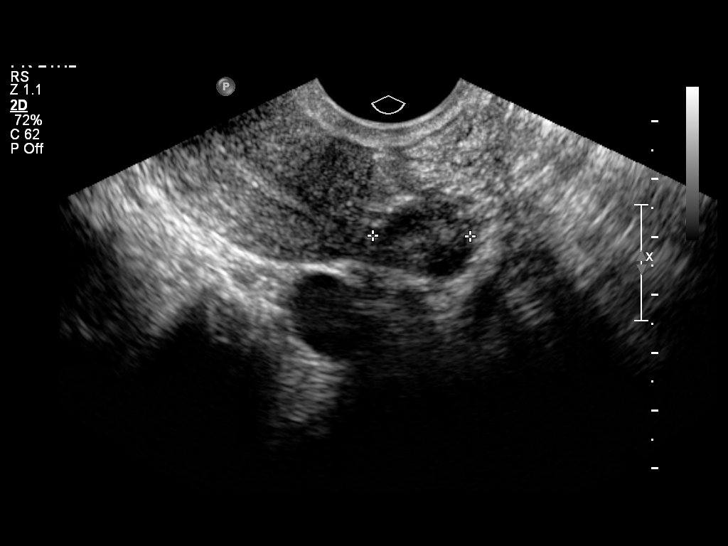
[im 14/33]
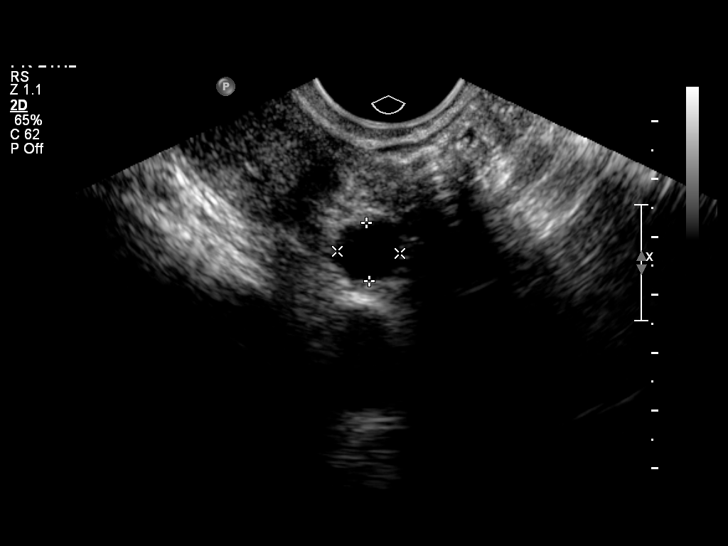
[im 17/33]
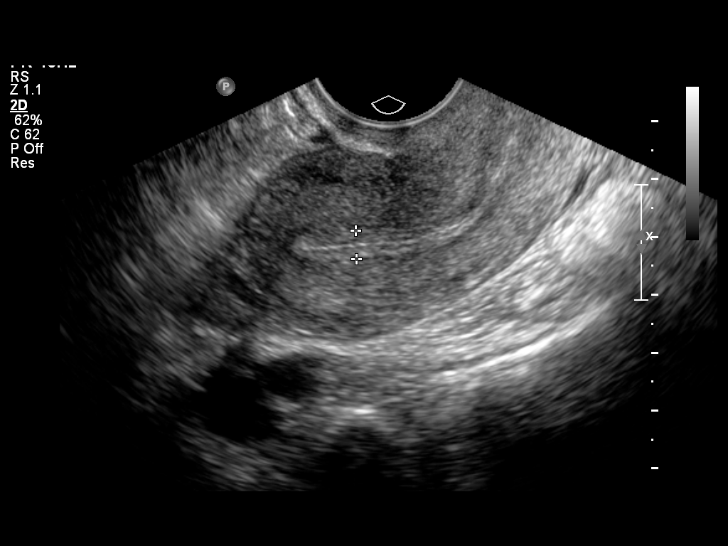
[im 19/33]
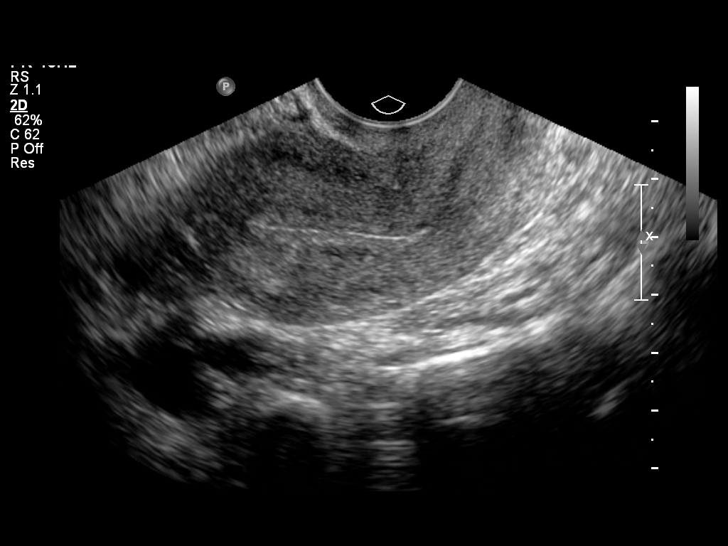
[im 22/33]
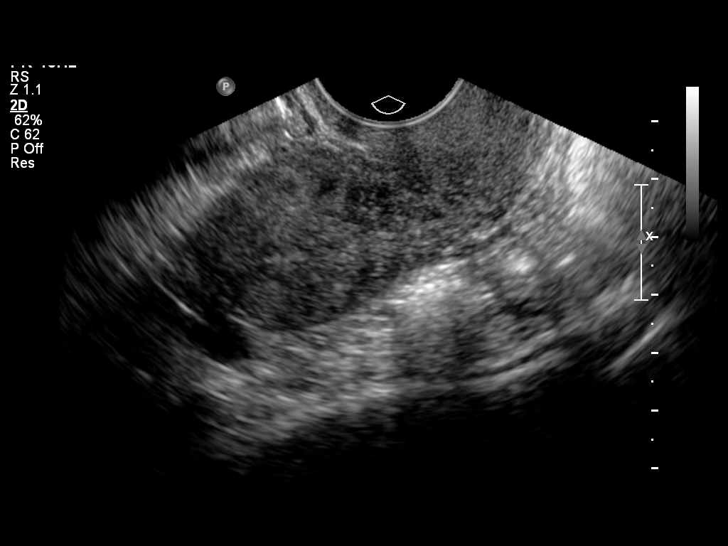
[im 25/33]
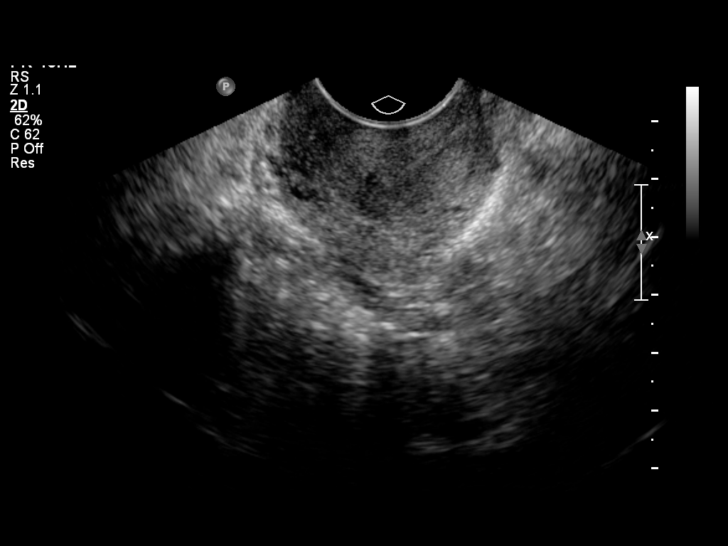
[im 27/33]
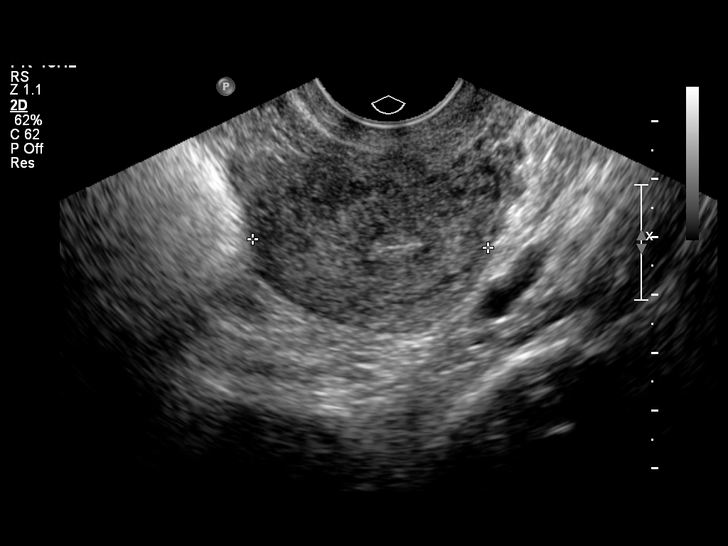
[im 30/33]
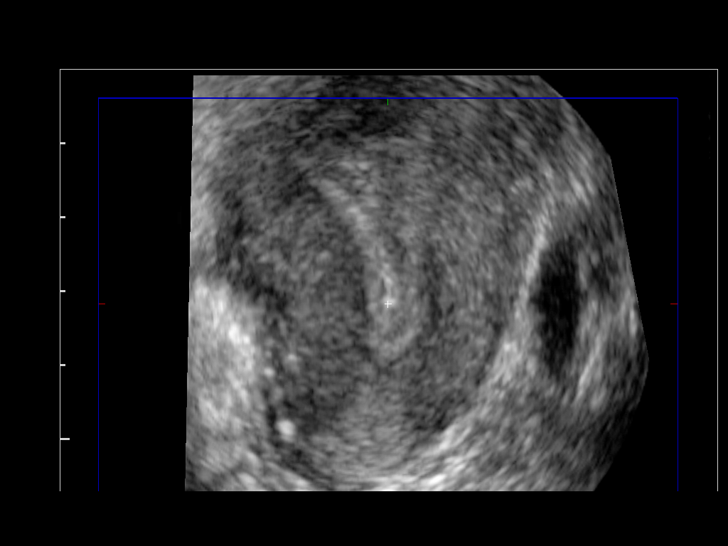
[im 33/33]
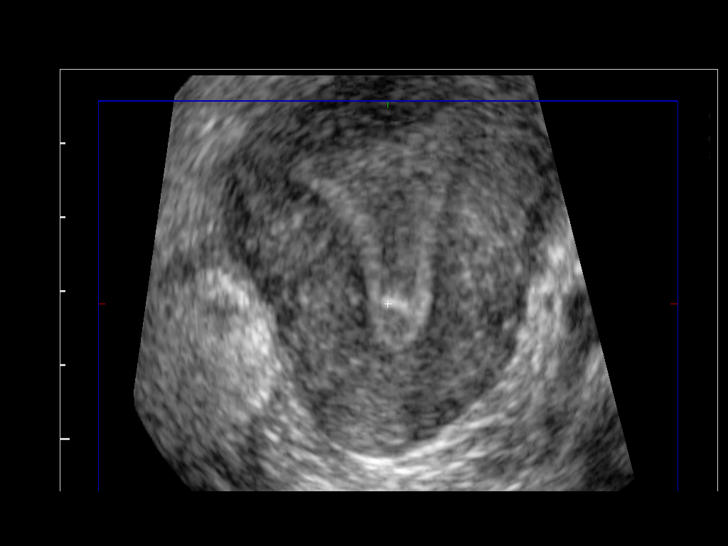

[13 of 25 positions shown; findings below may reference images not displayed]

FINDINGS: Right Kidney:  The right kidney demonstrates a sagittal length of
10.2 cm.  A normal parenchymal pattern is seen.  There is mild
dilatation of the collecting system noted with no associated
ureterectasis.  Specifically no echogenic foci are identified to
suggest the presence of renal calculi.

Left Kidney:  The left kidney demonstrates a sagittal length of
10.1 cm.  Normal renal parenchyma is noted with no focal
abnormality seen.  No signs of hydronephrosis are noted.

Bladder:  The bladder has a normal filled appearance.  Strong
bilateral ureteral jets are seen and this would mitigate against
the presence of a complete obstructive process on this side.  The
presence of this mild dilatation with a normal ureteral jet and
history of pain may signify a recently passed renal calculus. A
partial obstructive process is not excluded.
IMPRESSION: Mild right collecting system dilatation with no associated
ureterectasis and a strong right ureteral jet mitigating against a
complete obstructive process.  No evidence for renal calculi noted.
Given the mild dilatation on the right, if further assessment is
warranted clinically, unenhanced CT can evaluate for the presence
of a nonobstructing ureteral stone.

## 2009-04-19 IMAGING — US US RENAL
1 series · 13 of 25 positions shown · non-contrast
Comparison: None

CLINICAL DATA: Right flank pain.  Question renal stones

RENAL/URINARY TRACT ULTRASOUND COMPLETE

[Series 1: us renal · 0.23mm/px · 13 of 38 slices shown]
[im 1/38]
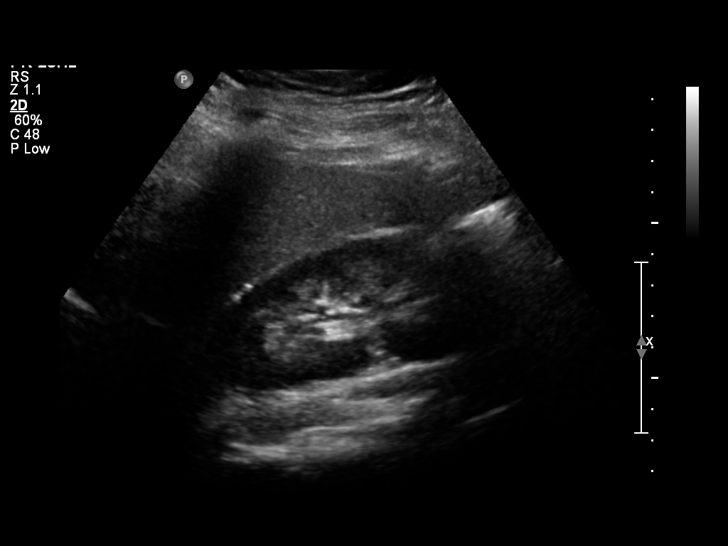
[im 4/38]
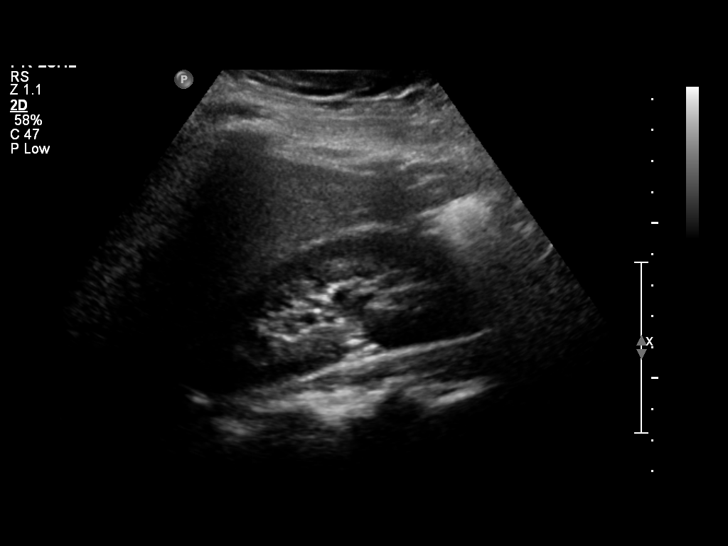
[im 7/38]
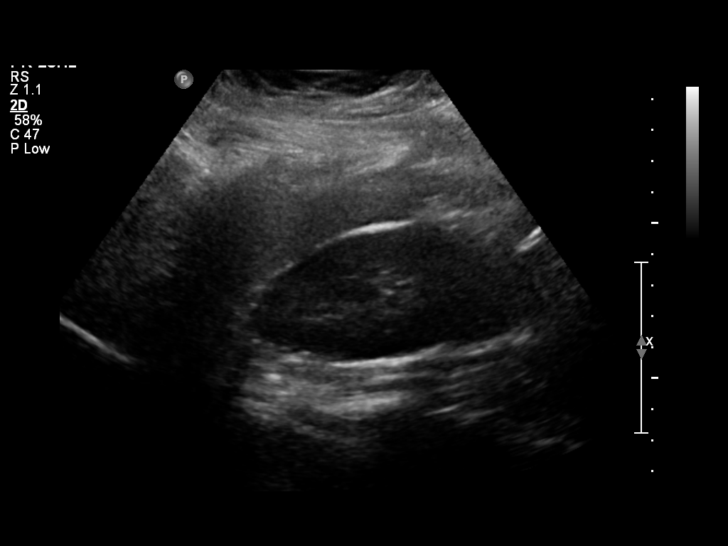
[im 10/38]
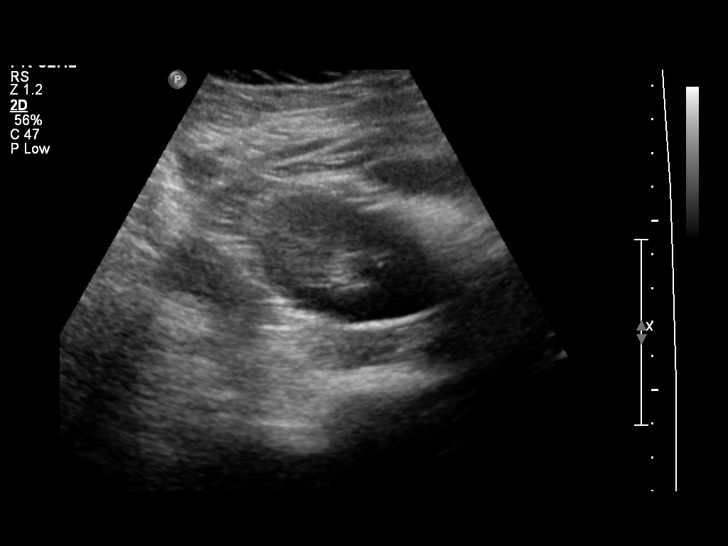
[im 13/38]
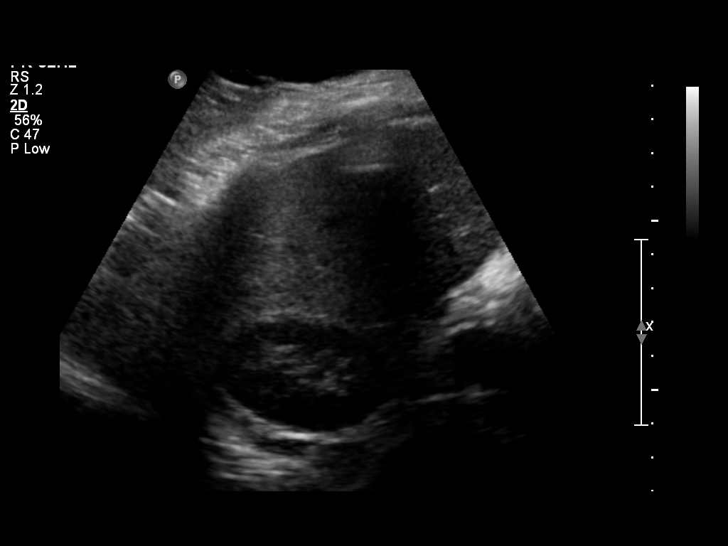
[im 16/38]
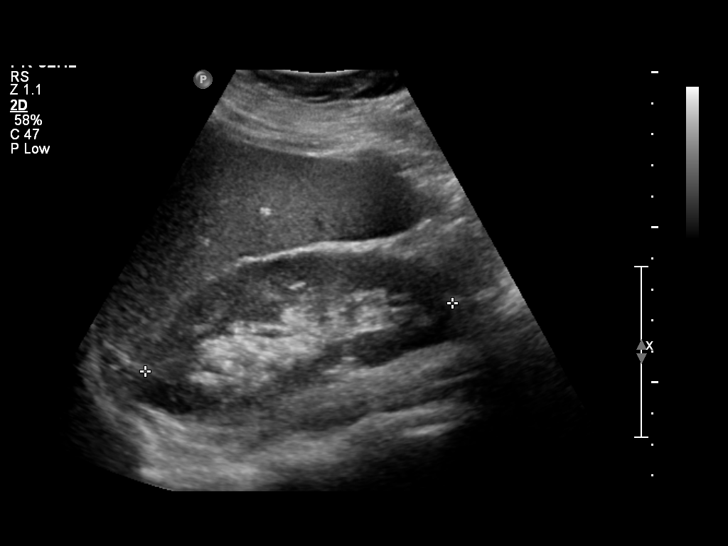
[im 19/38]
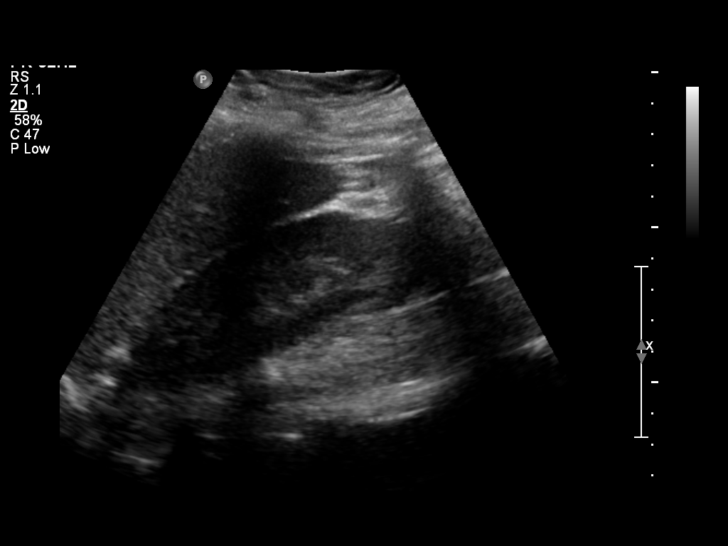
[im 22/38]
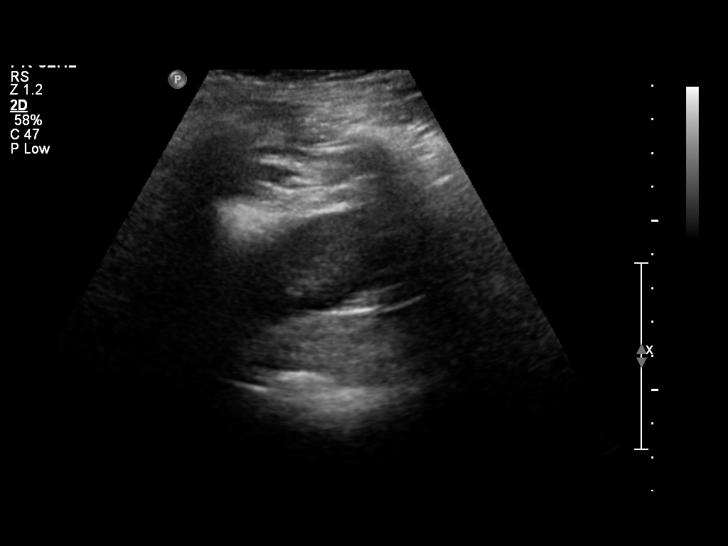
[im 25/38]
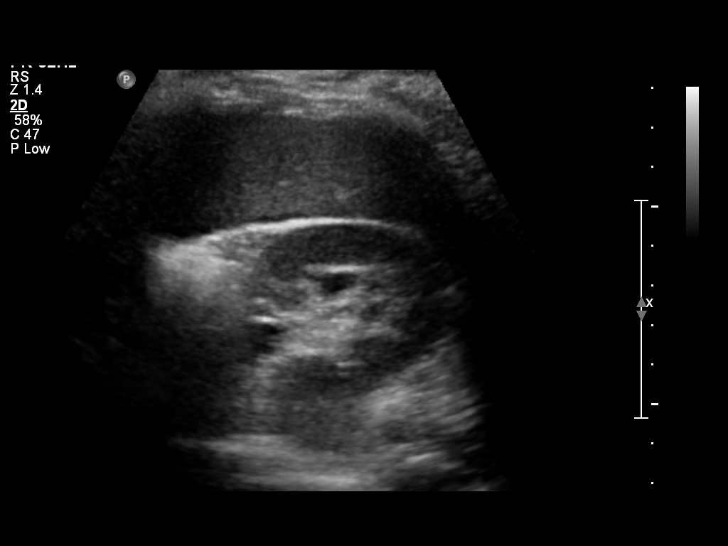
[im 28/38]
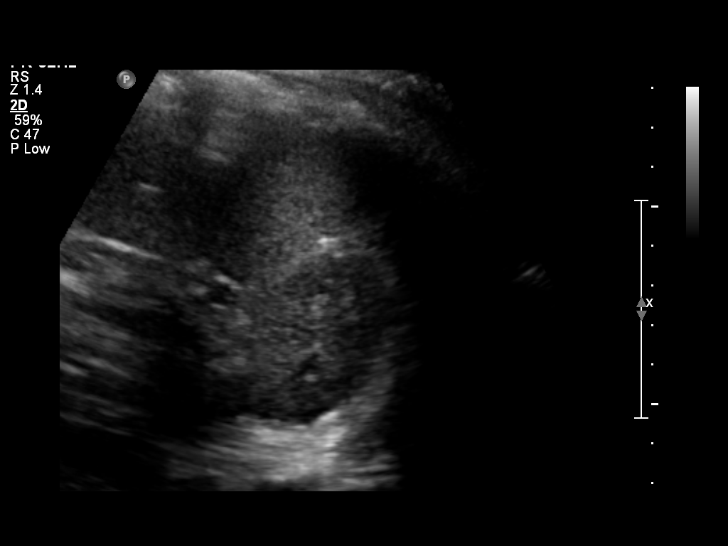
[im 31/38]
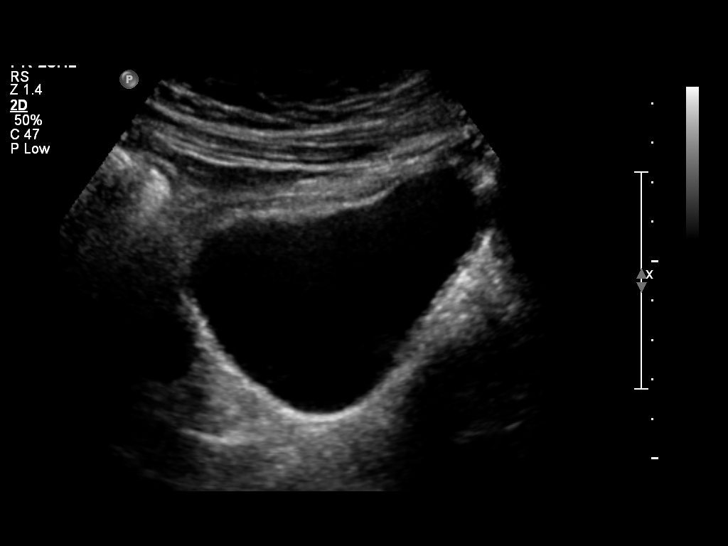
[im 34/38]
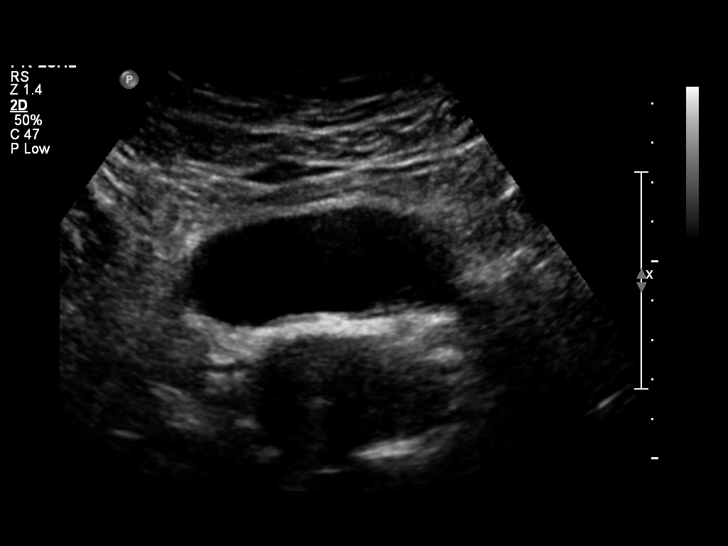
[im 38/38]
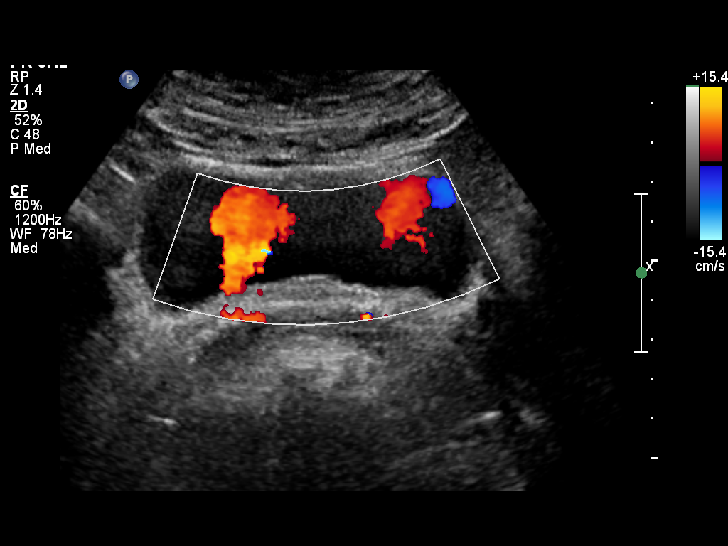

[13 of 25 positions shown; findings below may reference images not displayed]

FINDINGS: Right Kidney:  The right kidney demonstrates a sagittal length of
10.2 cm.  A normal parenchymal pattern is seen.  There is mild
dilatation of the collecting system noted with no associated
ureterectasis.  Specifically no echogenic foci are identified to
suggest the presence of renal calculi.

Left Kidney:  The left kidney demonstrates a sagittal length of
10.1 cm.  Normal renal parenchyma is noted with no focal
abnormality seen.  No signs of hydronephrosis are noted.

Bladder:  The bladder has a normal filled appearance.  Strong
bilateral ureteral jets are seen and this would mitigate against
the presence of a complete obstructive process on this side.  The
presence of this mild dilatation with a normal ureteral jet and
history of pain may signify a recently passed renal calculus. A
partial obstructive process is not excluded.
IMPRESSION: Mild right collecting system dilatation with no associated
ureterectasis and a strong right ureteral jet mitigating against a
complete obstructive process.  No evidence for renal calculi noted.
Given the mild dilatation on the right, if further assessment is
warranted clinically, unenhanced CT can evaluate for the presence
of a nonobstructing ureteral stone.

## 2010-01-02 ENCOUNTER — Inpatient Hospital Stay (HOSPITAL_COMMUNITY): Admission: AD | Admit: 2010-01-02 | Discharge: 2010-01-02 | Payer: Self-pay | Admitting: Obstetrics and Gynecology

## 2010-04-27 ENCOUNTER — Emergency Department (HOSPITAL_BASED_OUTPATIENT_CLINIC_OR_DEPARTMENT_OTHER): Admission: EM | Admit: 2010-04-27 | Discharge: 2010-04-27 | Payer: Self-pay | Admitting: Emergency Medicine

## 2010-06-27 ENCOUNTER — Emergency Department (HOSPITAL_BASED_OUTPATIENT_CLINIC_OR_DEPARTMENT_OTHER)
Admission: EM | Admit: 2010-06-27 | Discharge: 2010-06-27 | Payer: Self-pay | Source: Home / Self Care | Admitting: Emergency Medicine

## 2010-06-27 ENCOUNTER — Ambulatory Visit: Payer: Self-pay | Admitting: Interventional Radiology

## 2010-06-27 IMAGING — CT CT HEAD W/O CM
2 series · 16 of 30 positions shown, 18 images · non-contrast
Comparison: [DATE]

CLINICAL DATA: Disoriented.  Headache.

CT HEAD WITHOUT CONTRAST
TECHNIQUE: Contiguous axial images were obtained from the base of
the skull through the vertex without contrast.

[Series 2: head 4.8 h37s · axial · 0.41mm/px · z∈[-130,+3]mm · 8 of 36 slices shown, 10 images]
[im 4/36  brain]
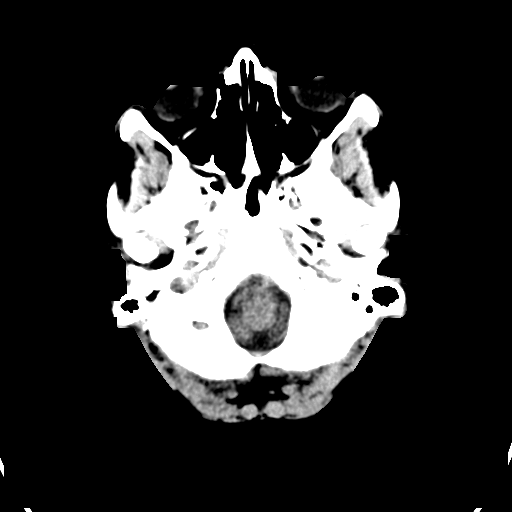
[im 4/36  bone]
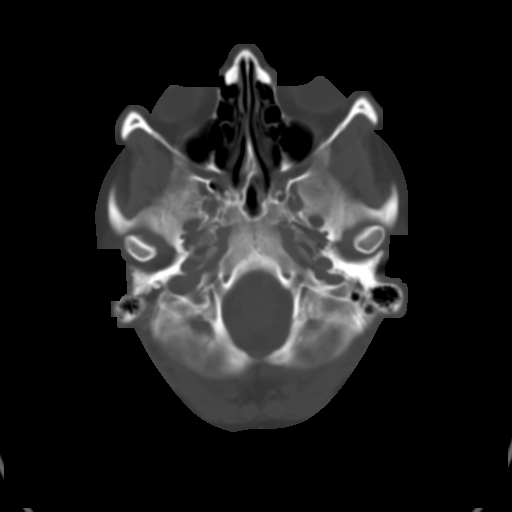
[im 8/36  brain]
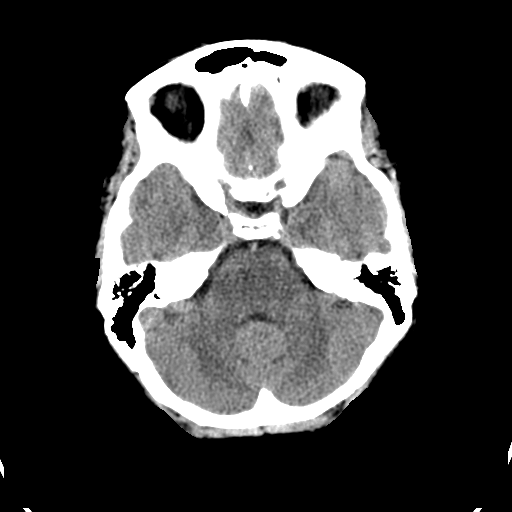
[im 12/36  brain]
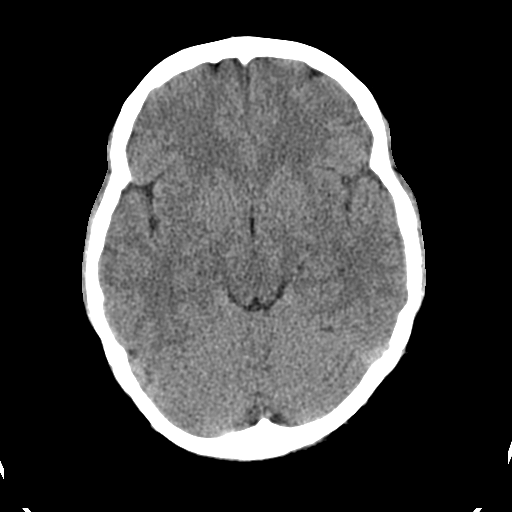
[im 16/36  brain]
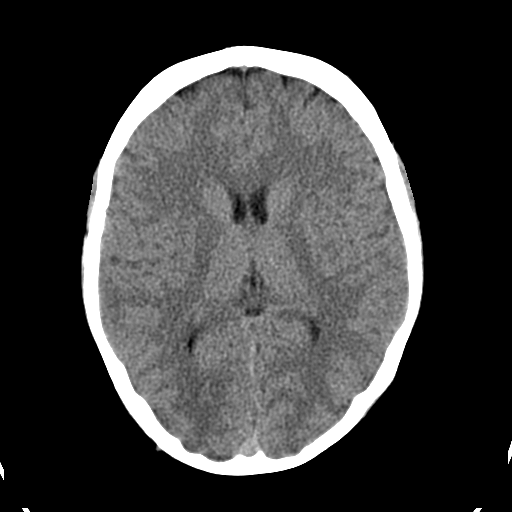
[im 20/36  brain]
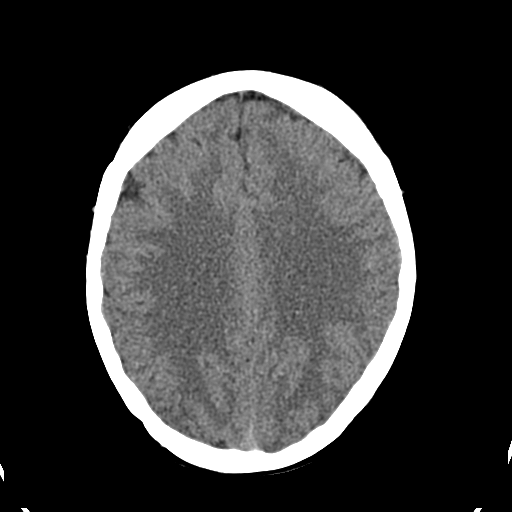
[im 20/36  bone]
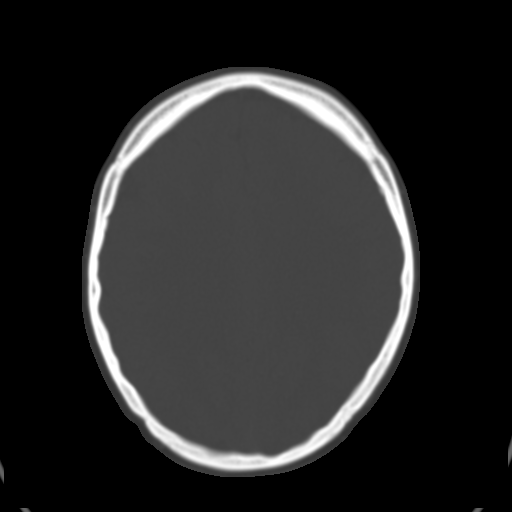
[im 24/36  brain]
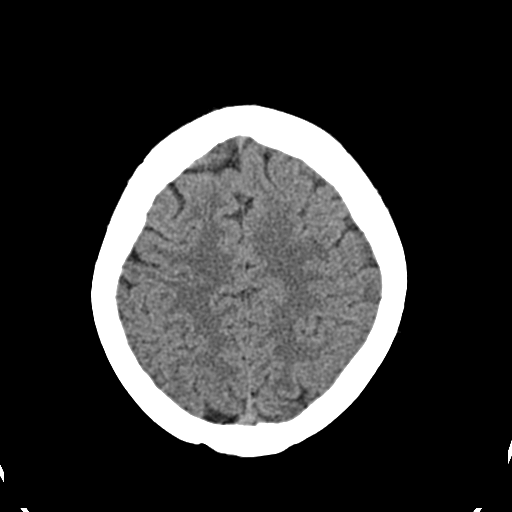
[im 28/36  brain]
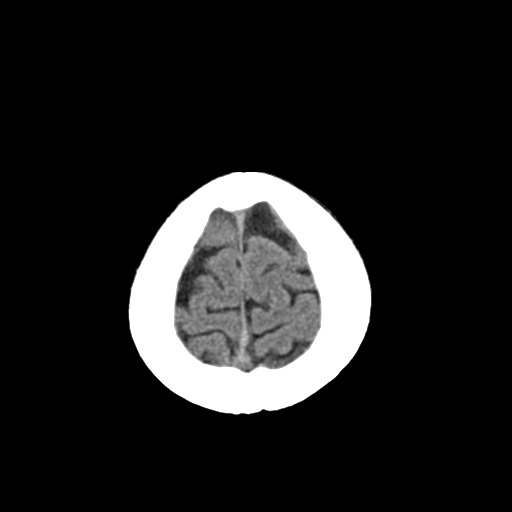
[im 32/36  brain]
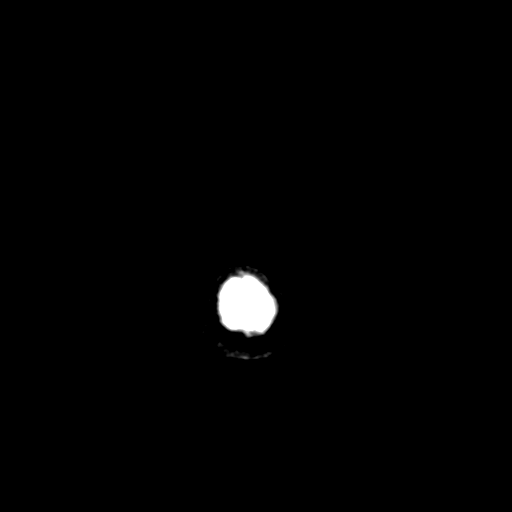

[Series 3: head 2.4 h60s bone · axial · 0.41mm/px · z∈[-129,+4]mm · 8 of 72 slices shown]
[im 8/72  bone]
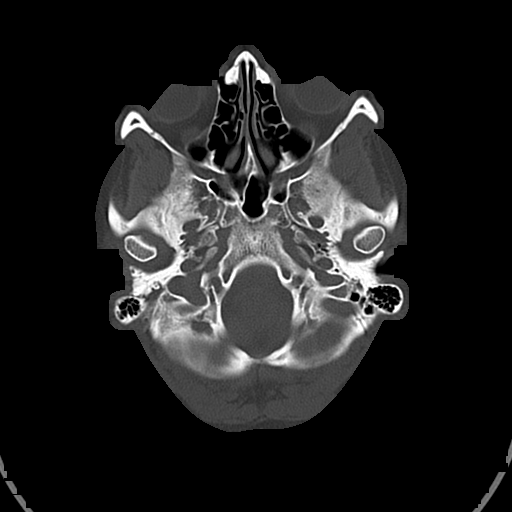
[im 15/72  bone]
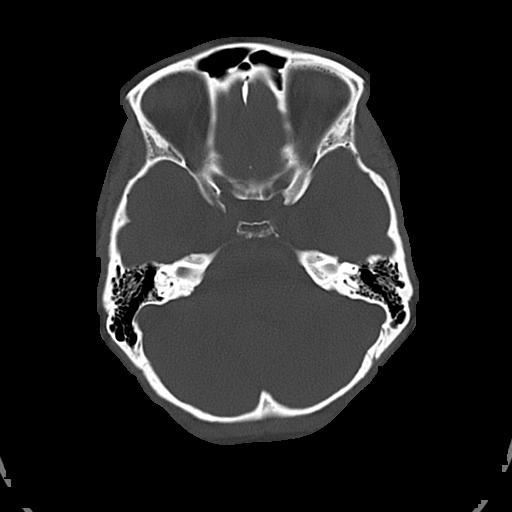
[im 23/72  bone]
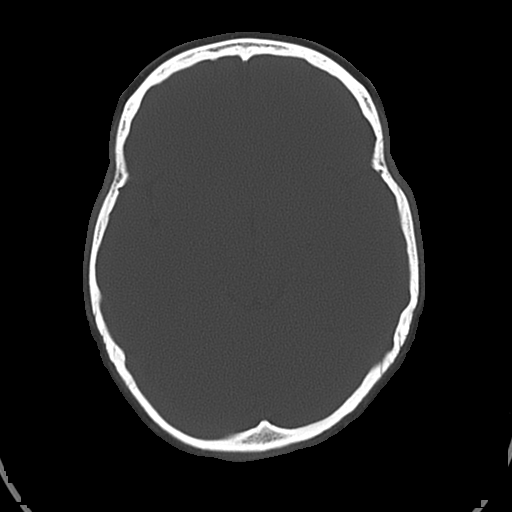
[im 30/72  bone]
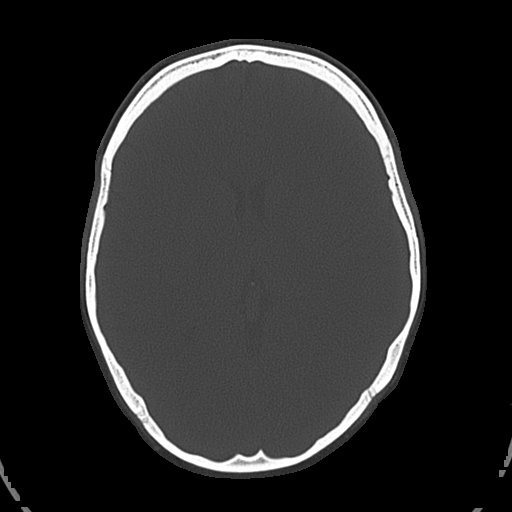
[im 42/72  bone]
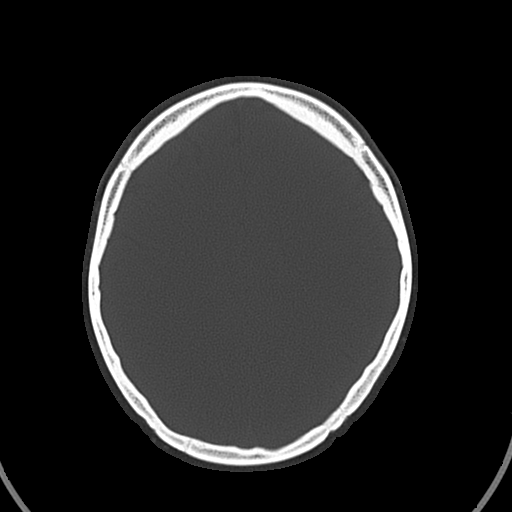
[im 49/72  bone]
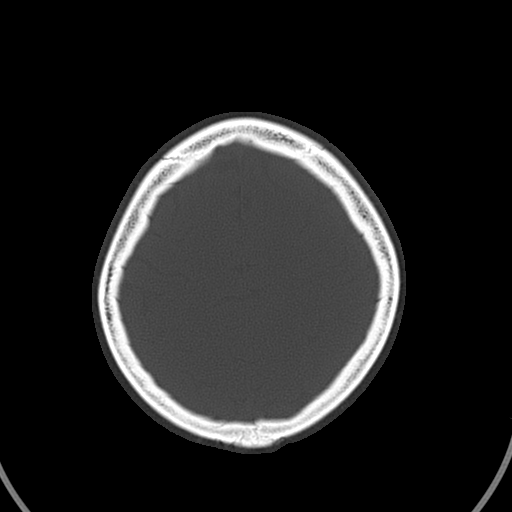
[im 57/72  bone]
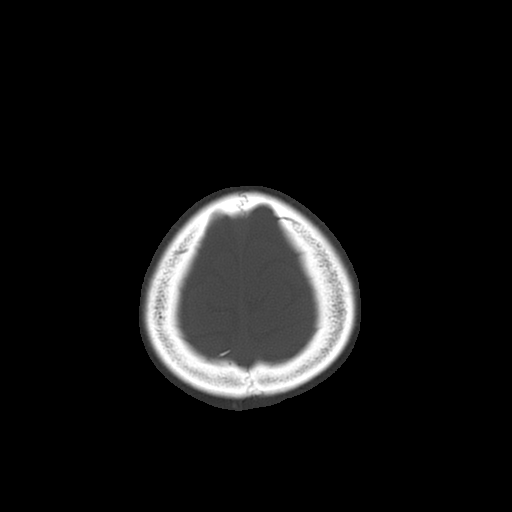
[im 64/72  bone]
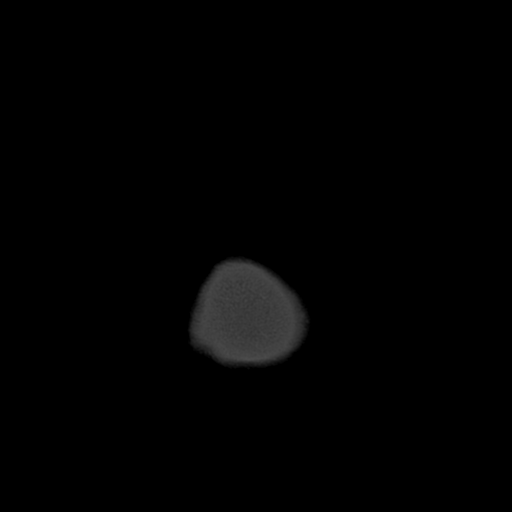

[16 of 30 positions shown; findings below may reference images not displayed]

FINDINGS: No mass effect, midline shift, or acute intracranial
hemorrhage.  Visualized mastoid air cells are clear.  Minimal fluid
in the left posterior ethmoid air cells.  Otherwise, visualized
paranasal sinuses are clear.
IMPRESSION: No acute intracranial pathology.

## 2010-09-21 ENCOUNTER — Emergency Department (HOSPITAL_COMMUNITY)
Admission: EM | Admit: 2010-09-21 | Discharge: 2010-09-21 | Payer: Self-pay | Source: Home / Self Care | Admitting: Emergency Medicine

## 2010-09-21 IMAGING — CR DG CHEST 2V
2 series · 2 of 2 positions shown · non-contrast
Comparison: [DATE]

CLINICAL DATA: Fever, back pain.

CHEST - 2 VIEW

[w chest pa]
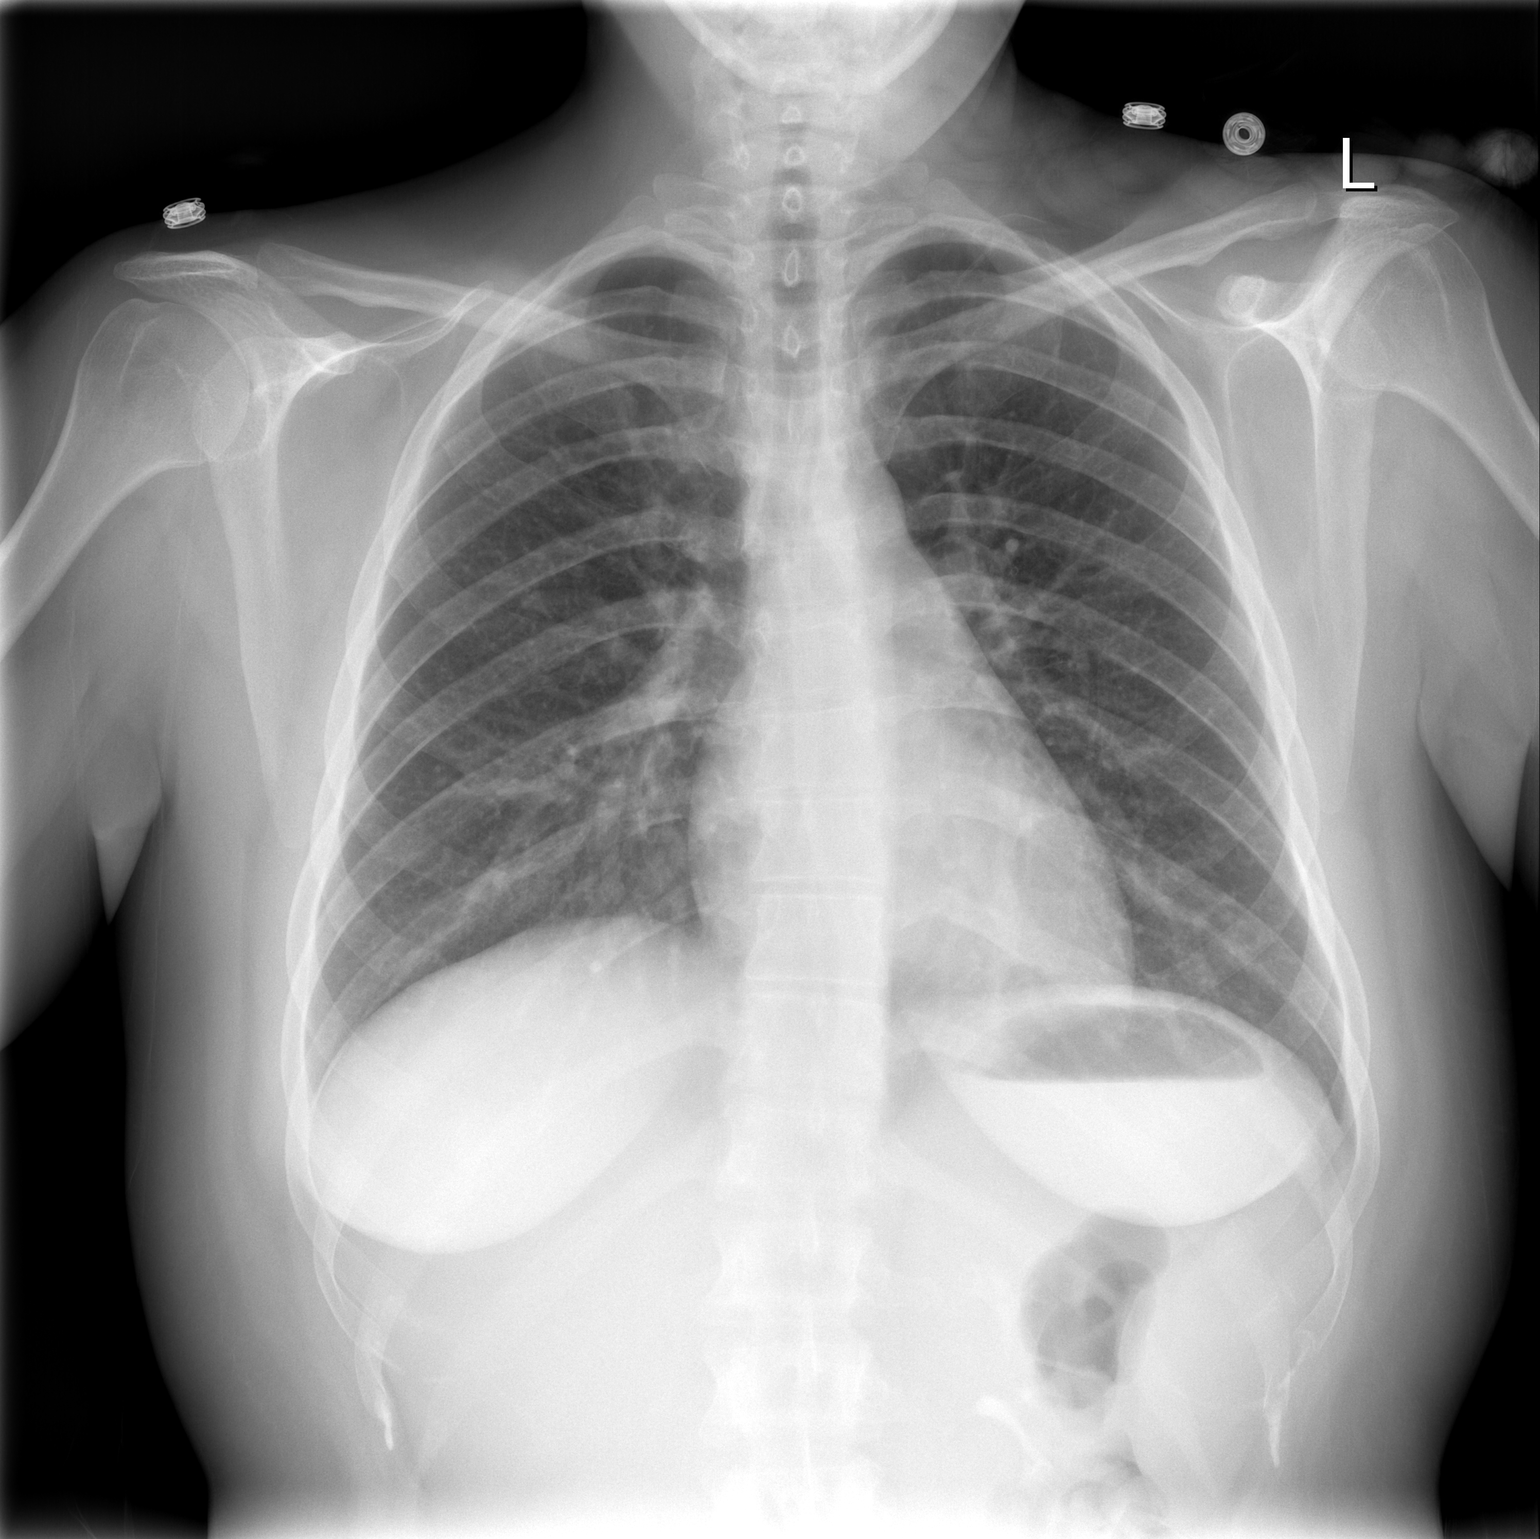

[w chest lat]
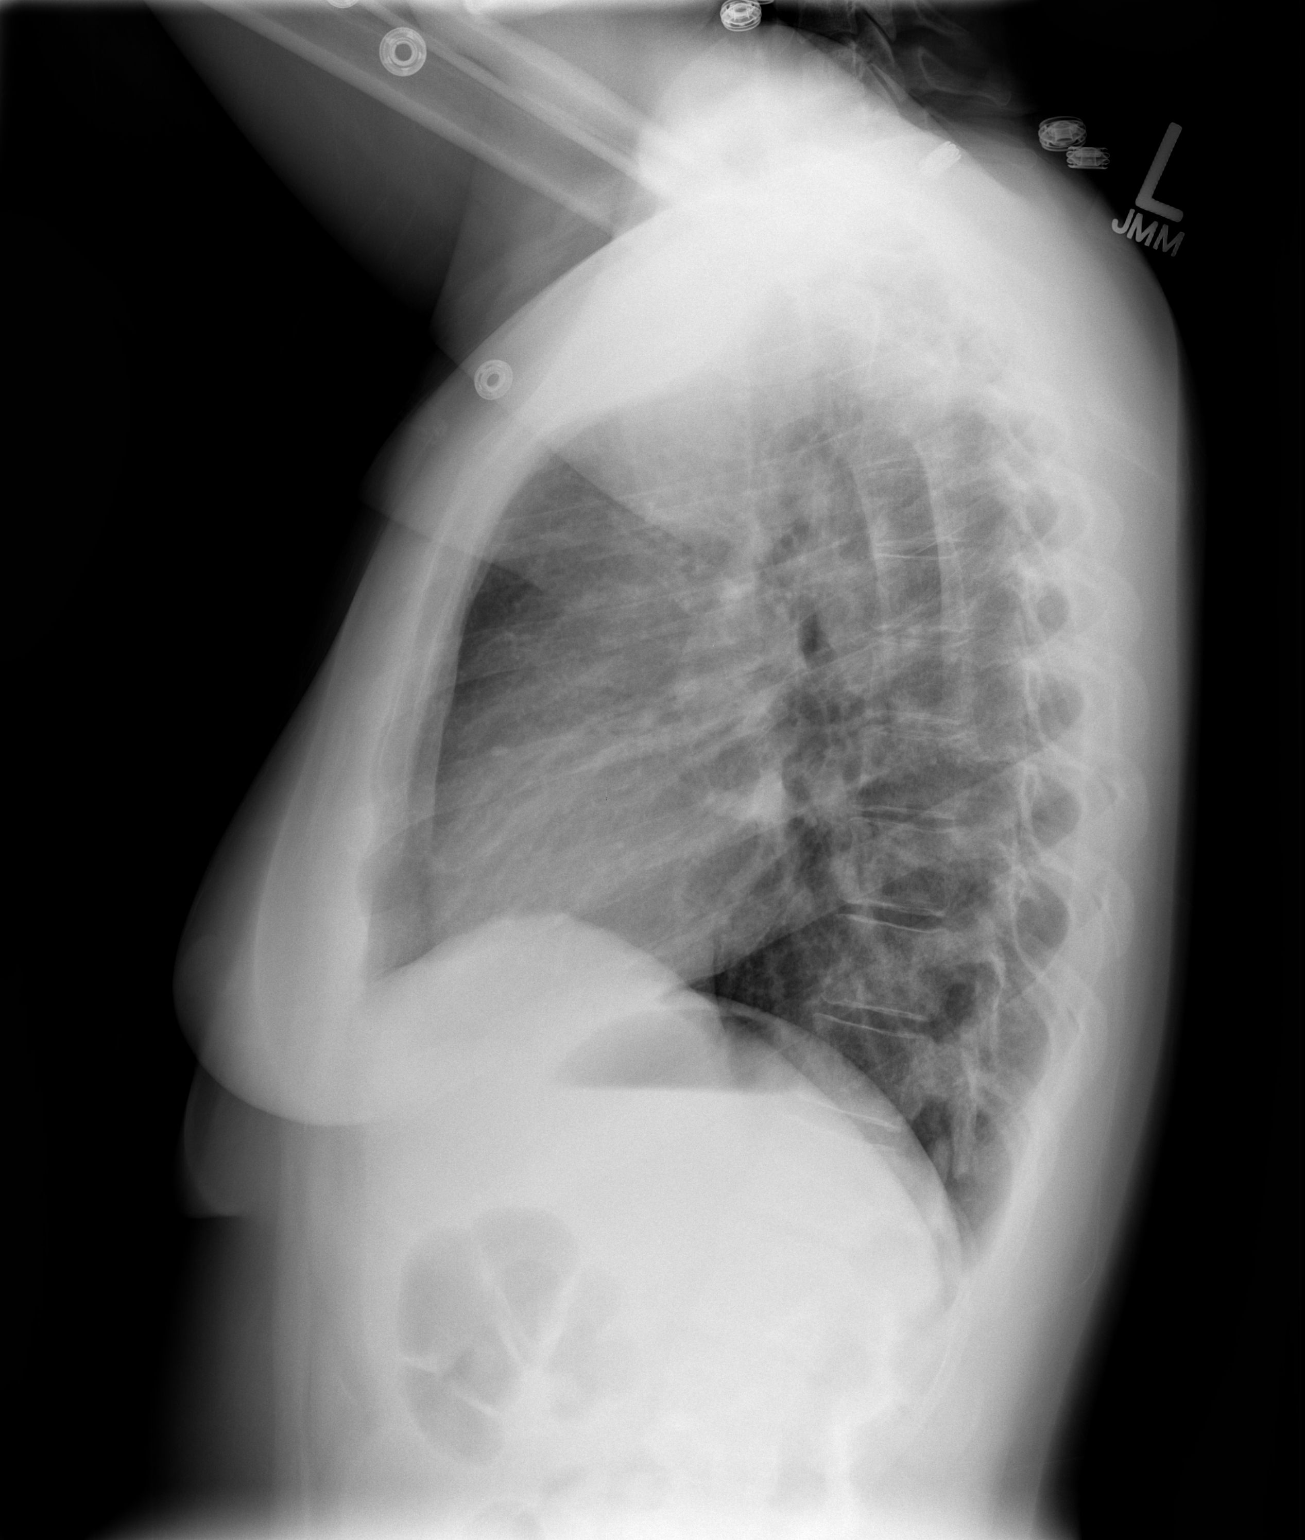

[2 of 2 positions shown; findings below may reference images not displayed]

FINDINGS: Mild peribronchial thickening.  No confluent airspace
opacities or effusions.  Heart is normal size.
IMPRESSION: Mild bronchitic changes.

## 2010-09-21 IMAGING — CT CT ABD-PELV W/ CM
2 of 4 series · 17 of 46 positions shown, 19 images · IV contrast (water & 100ml omni 300)
Comparison: Pelvic ultrasound [DATE].  Pelvic ultrasound and
renal ultrasound [DATE].

CLINICAL DATA: Fever.  Low back pain.

CT ABDOMEN AND PELVIS WITH CONTRAST
TECHNIQUE: Multidetector CT imaging of the abdomen and pelvis was
performed following the standard protocol during bolus
administration of intravenous contrast.
Contrast: 100 ml Omnipaque 300

[Series 2: routine abdomen · axial · 0.80mm/px · z∈[-458,-23]mm · 14 of 95 slices shown, 16 images]
[im 4/95  soft-tissue]
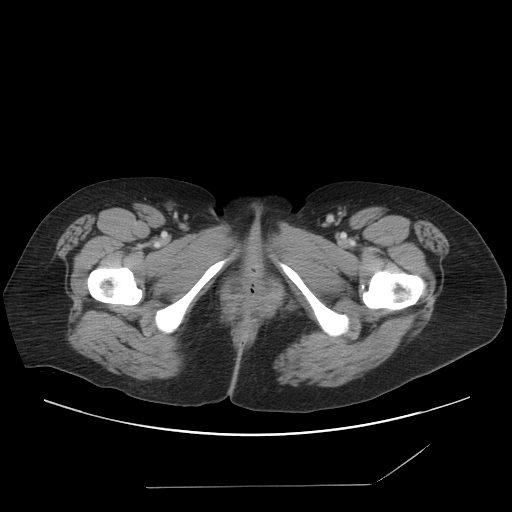
[im 4/95  bone]
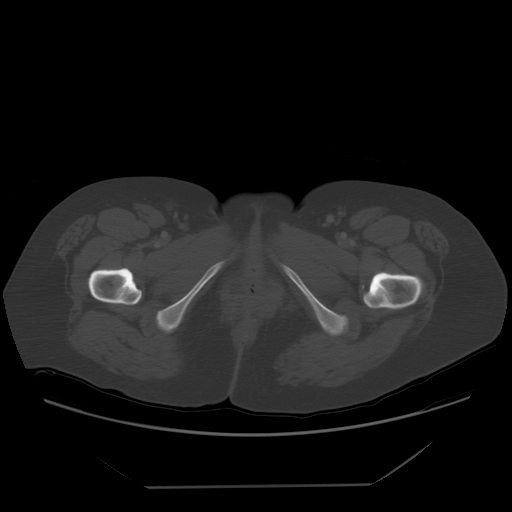
[im 12/95  soft-tissue]
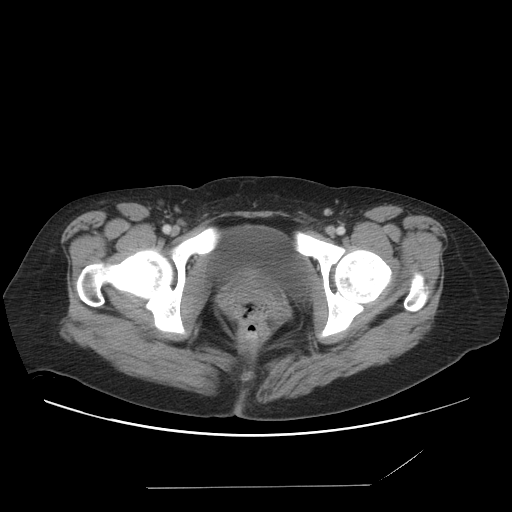
[im 20/95  soft-tissue]
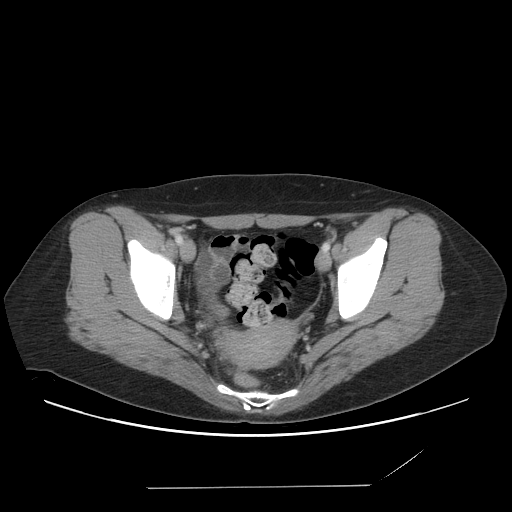
[im 24/95  soft-tissue]
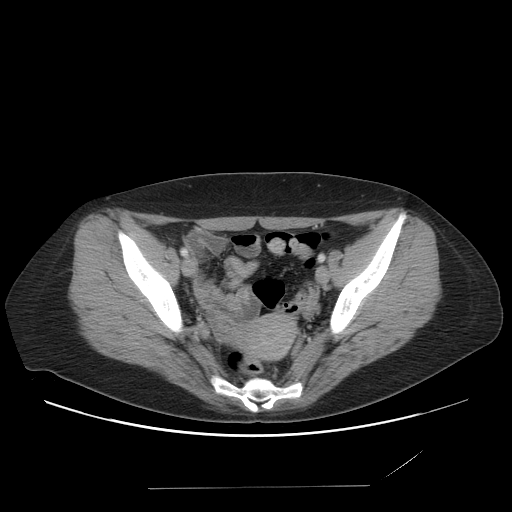
[im 32/95  soft-tissue]
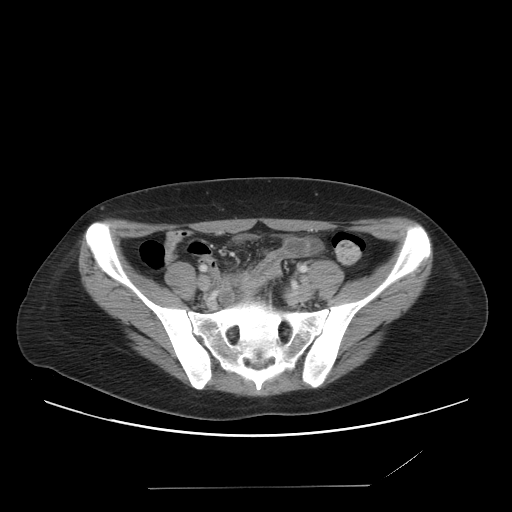
[im 40/95  soft-tissue]
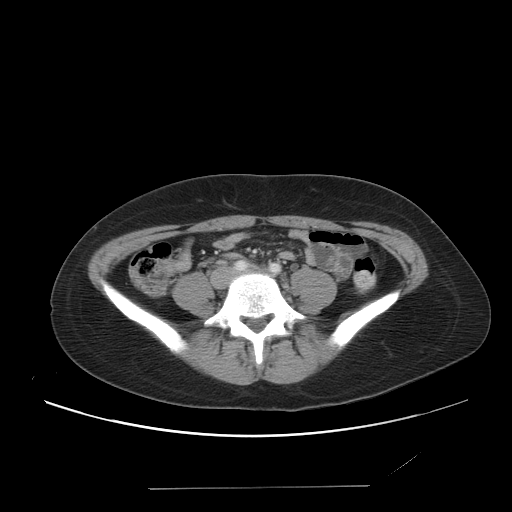
[im 44/95  soft-tissue]
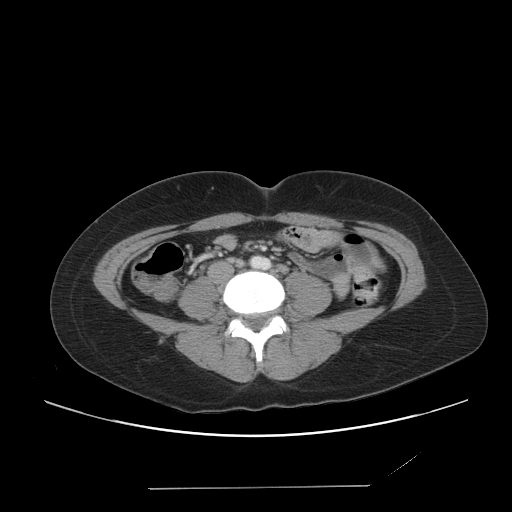
[im 51/95  soft-tissue]
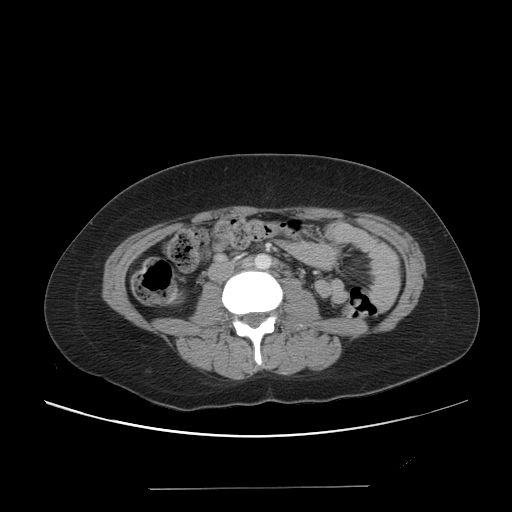
[im 55/95  soft-tissue]
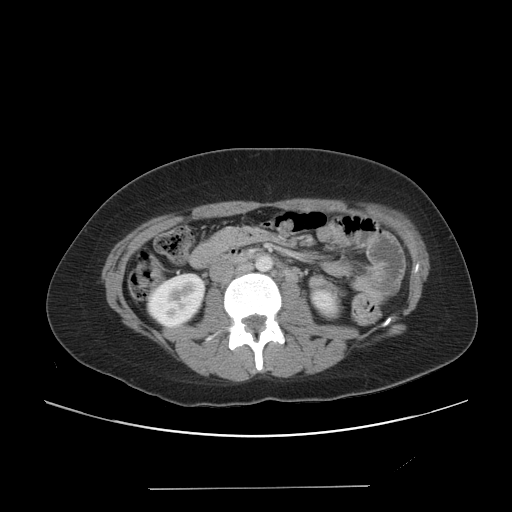
[im 55/95  bone]
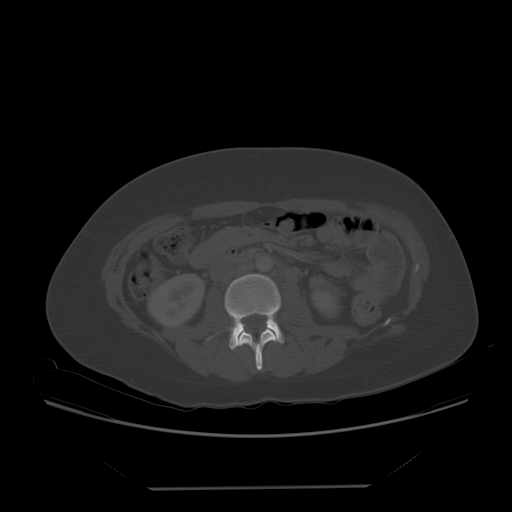
[im 63/95  soft-tissue]
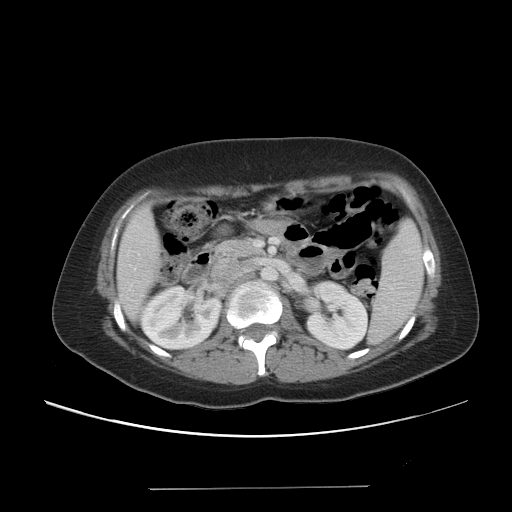
[im 71/95  soft-tissue]
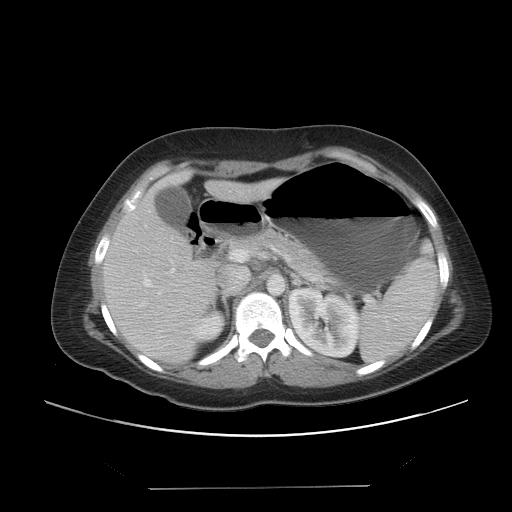
[im 75/95  soft-tissue]
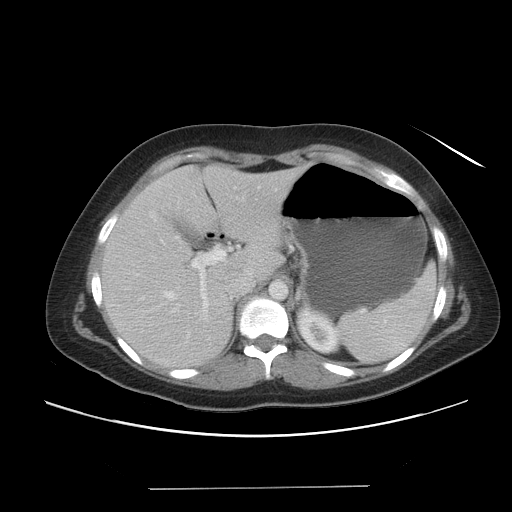
[im 83/95  soft-tissue]
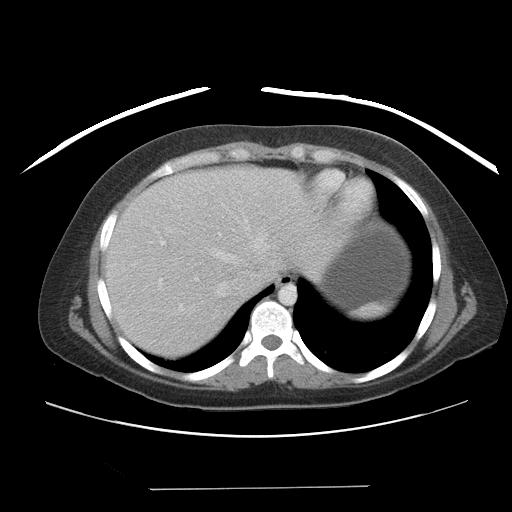
[im 91/95  soft-tissue]
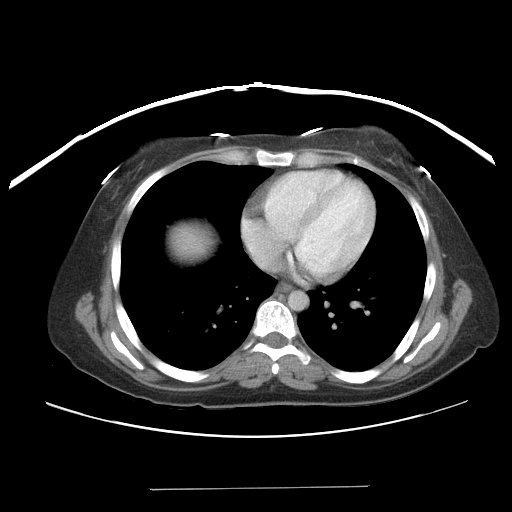

[Series 401: cor · coronal · 0.95mm/px · 3 of 84 slices shown]
[im 28/84  soft-tissue]
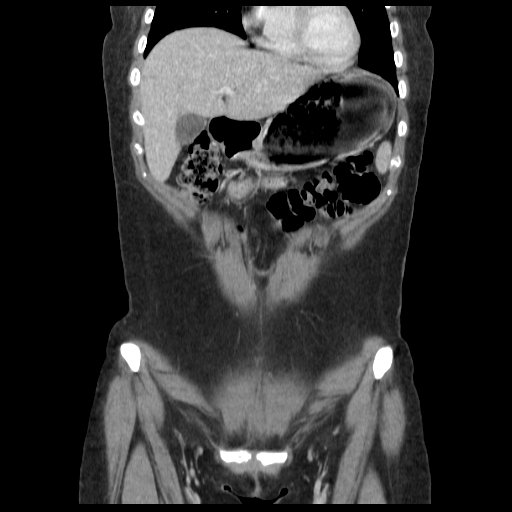
[im 37/84  soft-tissue]
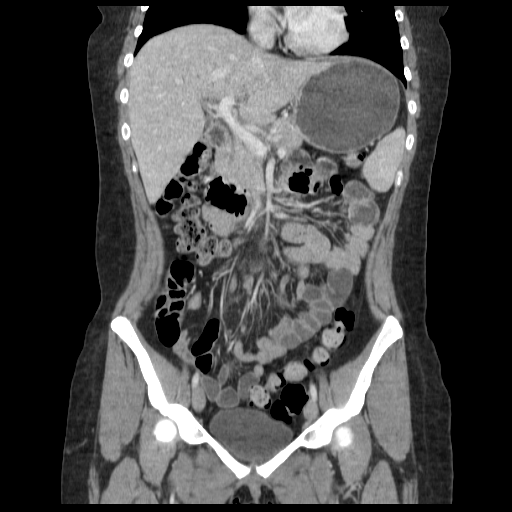
[im 47/84  soft-tissue]
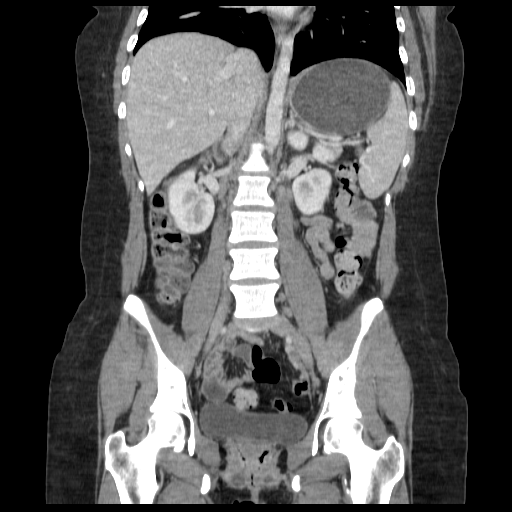

[17 of 46 positions shown; findings below may reference images not displayed]

FINDINGS: The lung bases are clear without focal nodule, mass, or
airspace disease.  The heart size is normal.  No significant
pleural or pericardial effusion is present.

The liver and spleen are within normal limits.  The stomach is
mildly distended.  No focal lesions are evident.  The duodenum and
pancreas are unremarkable.  The common bile duct and gallbladder
are within normal limits.  The adrenal glands are normal
bilaterally.  The kidneys are unremarkable.

Enlarged para-aortic lymph nodes are present.  The largest is
subjacent to the left renal hilum where a lymph node mass measures
20 x 20 mm.  Multiple other enlarged nodes or nodal masses are
present on both sides of the aorta.

The rectosigmoid colon is within normal limits.  The remainder of
the colon is unremarkable.  The appendix is visualized and within
normal limits.  The uterus and adnexa are normal for age.  A tiny
amount of free fluid is likely physiologic.  The urinary bladder is
unremarkable.

The bone windows demonstrate mild rightward curvature of the
thoracolumbar spine.  No focal lytic or blastic lesions are
evident.
IMPRESSION: 1.  Retroperitoneal adenopathy as described.  This is nonspecific,
but raises concern for a lymphoproliferative disorder such as
lymphoma.
2.  No other acute or focal pathology.

Critical test results telephoned to Dr. YOLANI at the time of

## 2010-09-30 LAB — CBC
HCT: 39.4 % (ref 36.0–46.0)
Hemoglobin: 13.2 g/dL (ref 12.0–15.0)
MCH: 29 pg (ref 26.0–34.0)
MCHC: 33.5 g/dL (ref 30.0–36.0)
MCV: 86.6 fL (ref 78.0–100.0)
Platelets: 186 10*3/uL (ref 150–400)
RBC: 4.55 MIL/uL (ref 3.87–5.11)
RDW: 12 % (ref 11.5–15.5)
WBC: 6 10*3/uL (ref 4.0–10.5)

## 2010-09-30 LAB — URINALYSIS, ROUTINE W REFLEX MICROSCOPIC
Bilirubin Urine: NEGATIVE
Hgb urine dipstick: NEGATIVE
Ketones, ur: 15 mg/dL — AB
Nitrite: NEGATIVE
Protein, ur: NEGATIVE mg/dL
Specific Gravity, Urine: >1.03 — ABNORMAL HIGH (ref 1.005–1.030)
Urine Glucose, Fasting: NEGATIVE mg/dL
Urobilinogen, UA: 0.2 mg/dL (ref 0.0–1.0)
pH: 6 (ref 5.0–8.0)

## 2010-09-30 LAB — DIFFERENTIAL
Basophils Absolute: 0 10*3/uL (ref 0.0–0.1)
Basophils Relative: 0 % (ref 0–1)
Eosinophils Absolute: 0 10*3/uL (ref 0.0–0.7)
Eosinophils Relative: 0 % (ref 0–5)
Lymphocytes Relative: 31 % (ref 12–46)
Lymphs Abs: 1.9 10*3/uL (ref 0.7–4.0)
Monocytes Absolute: 0.7 10*3/uL (ref 0.1–1.0)
Monocytes Relative: 11 % (ref 3–12)
Neutro Abs: 3.5 10*3/uL (ref 1.7–7.7)
Neutrophils Relative %: 58 % (ref 43–77)

## 2010-09-30 LAB — COMPREHENSIVE METABOLIC PANEL
ALT: 12 U/L (ref 0–35)
AST: 21 U/L (ref 0–37)
Albumin: 3.7 g/dL (ref 3.5–5.2)
Alkaline Phosphatase: 78 U/L (ref 39–117)
BUN: 7 mg/dL (ref 6–23)
CO2: 26 mEq/L (ref 19–32)
Calcium: 9 mg/dL (ref 8.4–10.5)
Chloride: 100 mEq/L (ref 96–112)
Creatinine, Ser: 0.7 mg/dL (ref 0.4–1.2)
GFR calc Af Amer: >60 mL/min (ref >60)
GFR calc non Af Amer: >60 mL/min (ref >60)
Glucose, Bld: 133 mg/dL — ABNORMAL HIGH (ref 70–99)
Potassium: 3.4 mEq/L — ABNORMAL LOW (ref 3.5–5.1)
Sodium: 133 mEq/L — ABNORMAL LOW (ref 135–145)
Total Bilirubin: 0.7 mg/dL (ref 0.3–1.2)
Total Protein: 7.9 g/dL (ref 6.0–8.3)

## 2010-09-30 LAB — LIPASE, BLOOD: Lipase: 20 U/L (ref 11–59)

## 2010-09-30 LAB — POCT PREGNANCY, URINE: Preg Test, Ur: NEGATIVE

## 2010-09-30 LAB — GC/CHLAMYDIA PROBE AMP, GENITAL
Chlamydia, DNA Probe: NEGATIVE
GC Probe Amp, Genital: NEGATIVE

## 2010-12-21 LAB — POCT URINALYSIS DIP (DEVICE)
Hgb urine dipstick: NEGATIVE
Nitrite: NEGATIVE
pH: 5 (ref 5.0–8.0)

## 2010-12-24 LAB — URINALYSIS, ROUTINE W REFLEX MICROSCOPIC
Bilirubin Urine: NEGATIVE
Hgb urine dipstick: NEGATIVE
Specific Gravity, Urine: 1.01 (ref 1.005–1.030)
pH: 6 (ref 5.0–8.0)

## 2010-12-24 LAB — CBC
HCT: 33 % — ABNORMAL LOW (ref 36.0–46.0)
HCT: 35.4 % — ABNORMAL LOW (ref 36.0–46.0)
MCHC: 35.5 g/dL (ref 30.0–36.0)
MCV: 86.6 fL (ref 78.0–100.0)
Platelets: 129 10*3/uL — ABNORMAL LOW (ref 150–400)
Platelets: 142 10*3/uL — ABNORMAL LOW (ref 150–400)
Platelets: 197 10*3/uL (ref 150–400)
RDW: 12.1 % (ref 11.5–15.5)
RDW: 12.3 % (ref 11.5–15.5)
RDW: 12.4 % (ref 11.5–15.5)

## 2010-12-24 LAB — COMPREHENSIVE METABOLIC PANEL
ALT: 23 U/L (ref 0–35)
Albumin: 3.7 g/dL (ref 3.5–5.2)
Alkaline Phosphatase: 108 U/L (ref 39–117)
BUN: 7 mg/dL (ref 6–23)
Potassium: 3.6 mEq/L (ref 3.5–5.1)
Sodium: 131 mEq/L — ABNORMAL LOW (ref 135–145)
Total Protein: 8.8 g/dL — ABNORMAL HIGH (ref 6.0–8.3)

## 2010-12-24 LAB — WET PREP, GENITAL
Clue Cells Wet Prep HPF POC: NONE SEEN
Trich, Wet Prep: NONE SEEN

## 2010-12-24 LAB — CULTURE, BLOOD (ROUTINE X 2)

## 2010-12-24 LAB — GC/CHLAMYDIA PROBE AMP, GENITAL
Chlamydia, DNA Probe: NEGATIVE
GC Probe Amp, Genital: NEGATIVE

## 2010-12-24 LAB — HEPATITIS B SURFACE ANTIGEN: Hepatitis B Surface Ag: NEGATIVE

## 2010-12-24 LAB — RPR: RPR Ser Ql: NONREACTIVE

## 2010-12-30 LAB — URINALYSIS, ROUTINE W REFLEX MICROSCOPIC
Hgb urine dipstick: NEGATIVE
Protein, ur: 100 mg/dL — AB
Urobilinogen, UA: 0.2 mg/dL (ref 0.0–1.0)

## 2010-12-31 LAB — POCT I-STAT, CHEM 8
BUN: 13 mg/dL (ref 6–23)
Chloride: 108 mEq/L (ref 96–112)
Creatinine, Ser: 0.8 mg/dL (ref 0.4–1.2)
Potassium: 4 mEq/L (ref 3.5–5.1)
Sodium: 140 mEq/L (ref 135–145)

## 2011-01-06 ENCOUNTER — Ambulatory Visit (INDEPENDENT_AMBULATORY_CARE_PROVIDER_SITE_OTHER): Payer: PRIVATE HEALTH INSURANCE | Admitting: Internal Medicine

## 2011-01-06 ENCOUNTER — Other Ambulatory Visit (INDEPENDENT_AMBULATORY_CARE_PROVIDER_SITE_OTHER): Payer: Self-pay | Admitting: Internal Medicine

## 2011-01-06 DIAGNOSIS — R933 Abnormal findings on diagnostic imaging of other parts of digestive tract: Secondary | ICD-10-CM

## 2011-01-06 DIAGNOSIS — R599 Enlarged lymph nodes, unspecified: Secondary | ICD-10-CM

## 2011-01-06 DIAGNOSIS — R109 Unspecified abdominal pain: Secondary | ICD-10-CM

## 2011-01-14 ENCOUNTER — Ambulatory Visit (HOSPITAL_COMMUNITY)
Admission: RE | Admit: 2011-01-14 | Discharge: 2011-01-14 | Disposition: A | Discharge status: Home / Self Care | Payer: PRIVATE HEALTH INSURANCE | Source: Ambulatory Visit | Attending: Internal Medicine | Admitting: Internal Medicine

## 2011-01-14 DIAGNOSIS — R109 Unspecified abdominal pain: Secondary | ICD-10-CM | POA: Insufficient documentation

## 2011-01-14 DIAGNOSIS — R599 Enlarged lymph nodes, unspecified: Secondary | ICD-10-CM | POA: Insufficient documentation

## 2011-01-14 IMAGING — CT CT ABD-PELV W/ CM
2 of 3 series · 16 of 46 positions shown, 18 images · IV contrast (Omnipaque 300)
Comparison: [DATE]

CLINICAL DATA: Large lymph nodes, abdominal pain

CT ABDOMEN AND PELVIS WITH CONTRAST
TECHNIQUE: Multidetector CT imaging of the abdomen and pelvis was
performed following the standard protocol during bolus
administration of intravenous contrast. Sagittal and coronal MPR
images reconstructed from axial data set.
Contrast: Dilute oral contrast, 100 ml Omnipaque 300 IV

[Series 2: abd_pel_with 5.0 b40f · axial · 0.68mm/px · z∈[-468,-63]mm · 13 of 95 slices shown, 15 images]
[im 7/95  soft-tissue]
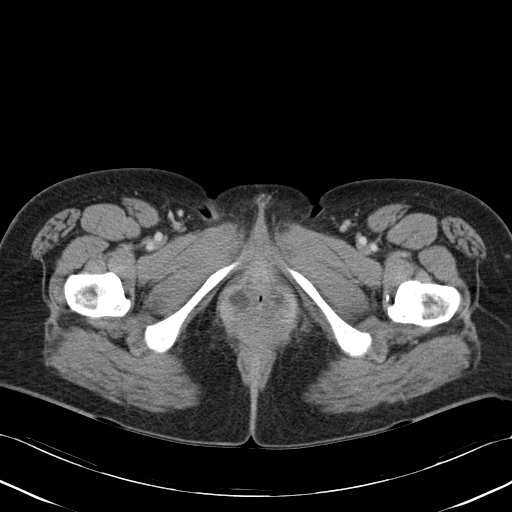
[im 7/95  bone]
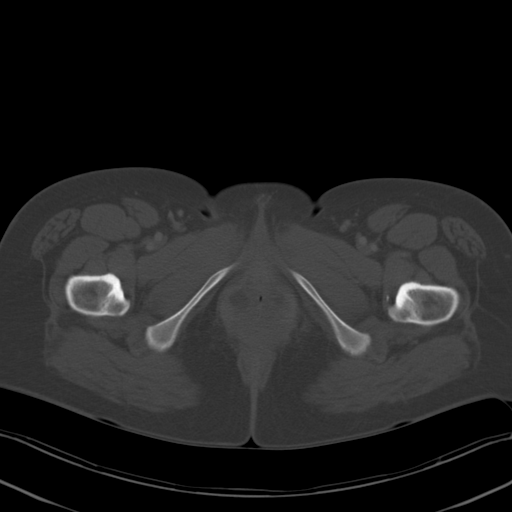
[im 13/95  soft-tissue]
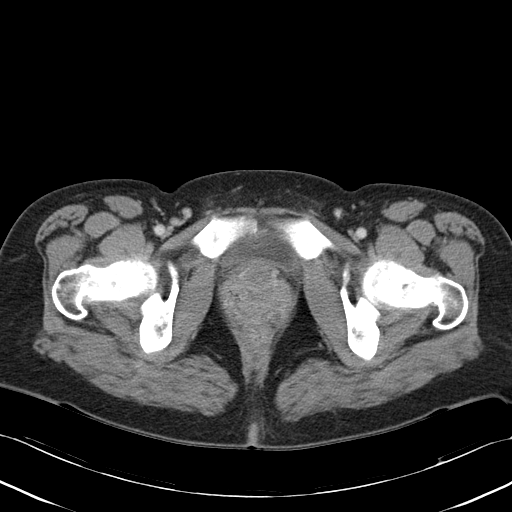
[im 19/95  soft-tissue]
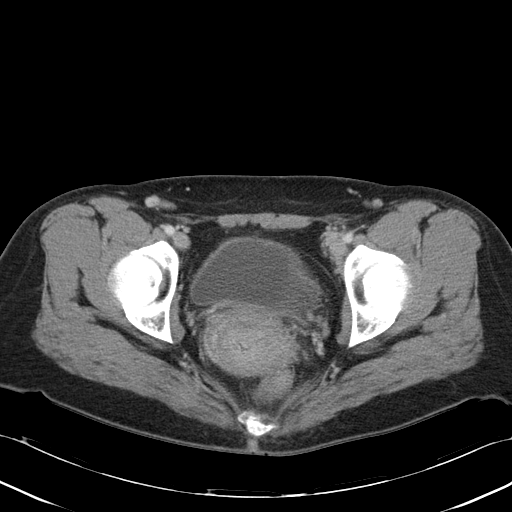
[im 28/95  soft-tissue]
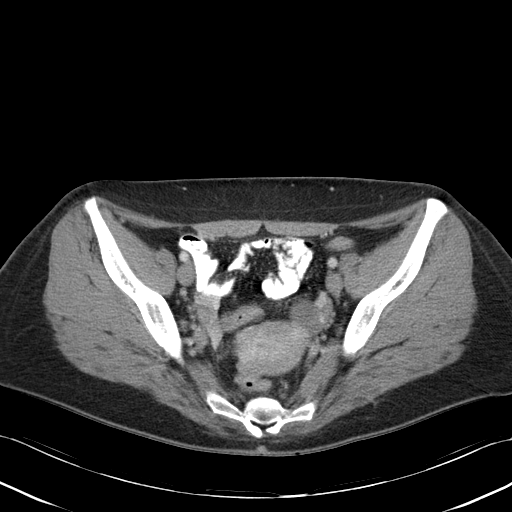
[im 34/95  soft-tissue]
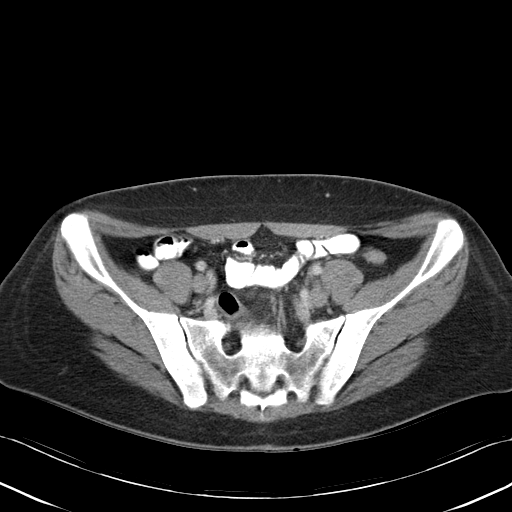
[im 40/95  soft-tissue]
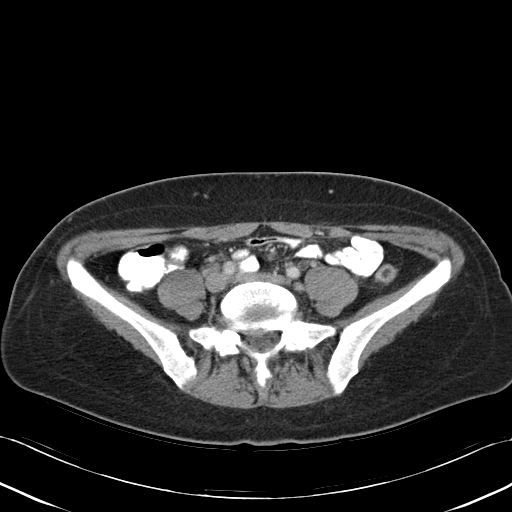
[im 49/95  soft-tissue]
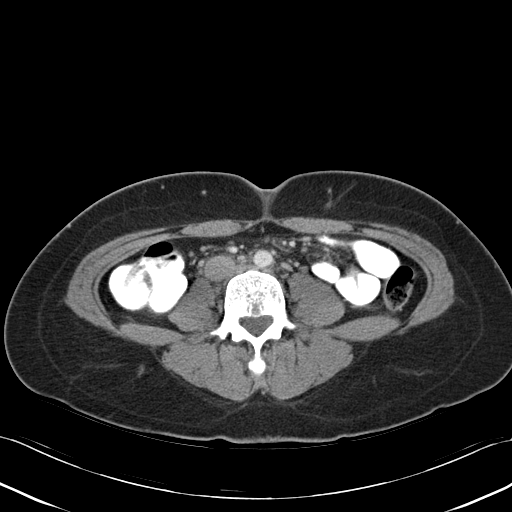
[im 55/95  soft-tissue]
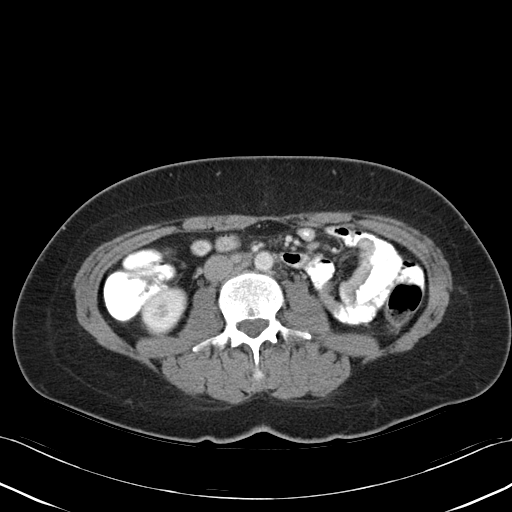
[im 61/95  soft-tissue]
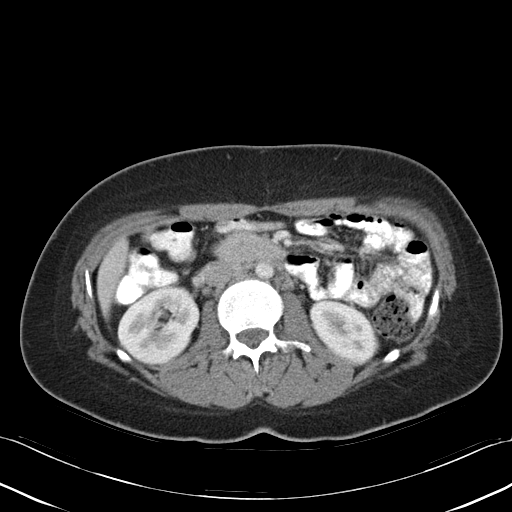
[im 61/95  bone]
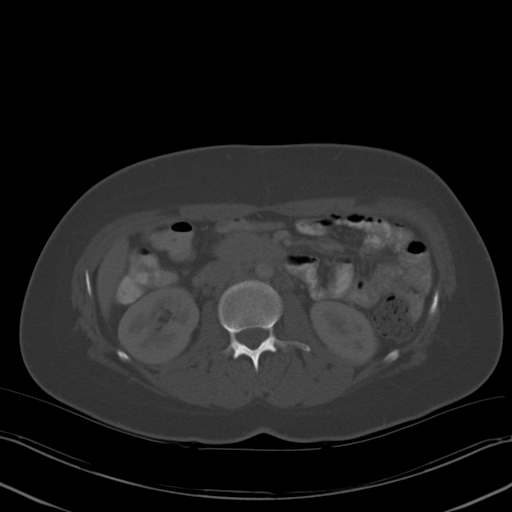
[im 67/95  soft-tissue]
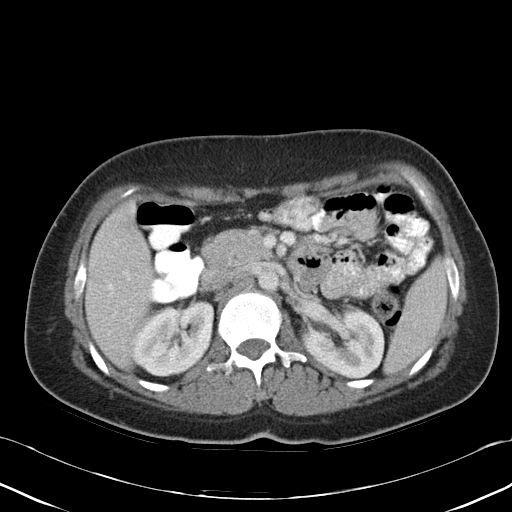
[im 76/95  soft-tissue]
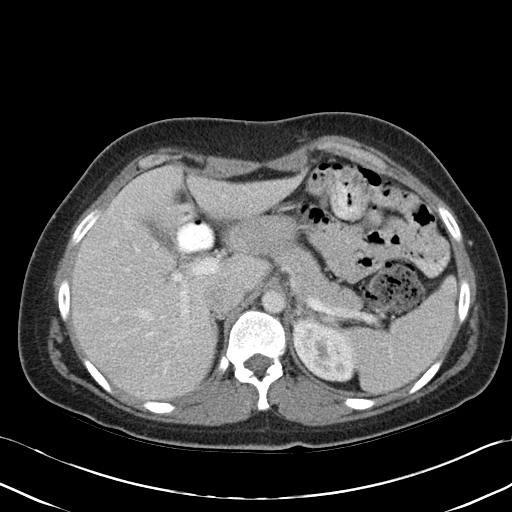
[im 82/95  soft-tissue]
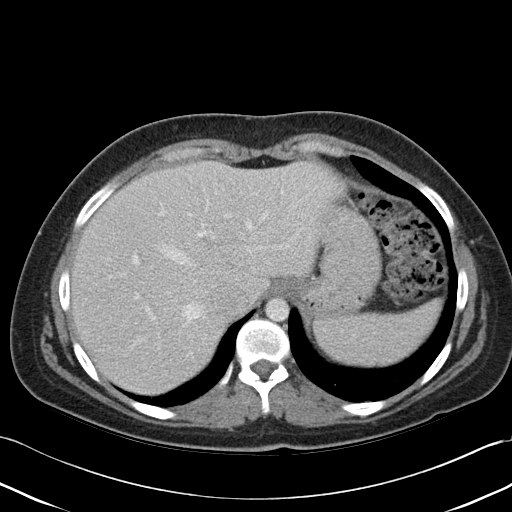
[im 88/95  soft-tissue]
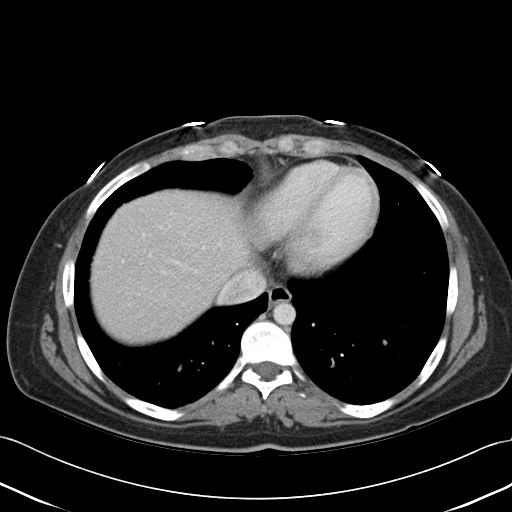

[Series 4: abd_pel_with 3.0 spo cor · coronal · 0.74mm/px · 3 of 66 slices shown]
[im 22/66  soft-tissue]
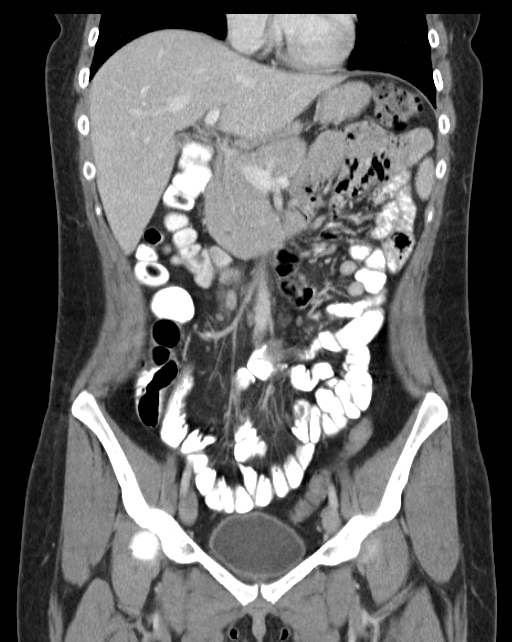
[im 29/66  soft-tissue]
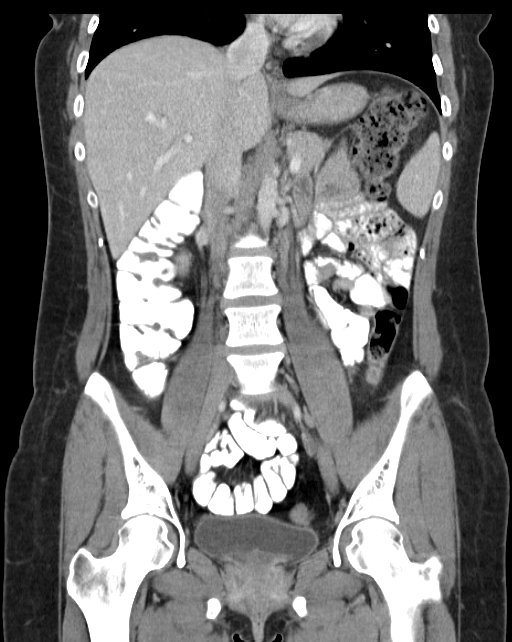
[im 37/66  soft-tissue]
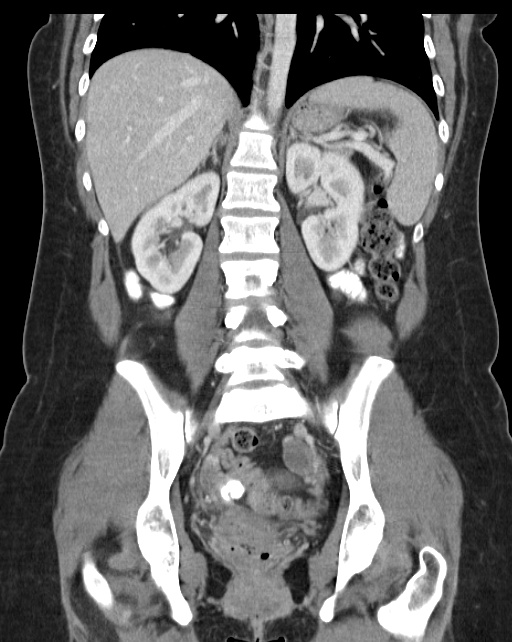

[16 of 46 positions shown; findings below may reference images not displayed]

FINDINGS: Lung bases clear.
Minimal focal fatty infiltration of liver adjacent to falciform
fissure.
Liver, spleen, pancreas, kidneys, and adrenal glands normal
appearance.
Enlarged nodes at left periaortic space on preceding exam resolved.
Few small left periaortic and aortocaval lymph nodes are now
identified, none pathologically enlarged.
Few scattered normal-sized mesenteric, iliac, and inguinal lymph
nodes.

Question small hemorrhagic cyst left ovary 2.2 x 2.2 cm image 67.
Unremarkable uterus and left ovary.
Small amount of nonspecific low attenuation free pelvic fluid.
Normal appendix.
Stomach and bowel loops unremarkable.
Bladder and ureters normal appearance.
No mass, enlarged lymph nodes, hernia, or free air.
No acute osseous findings.
IMPRESSION: Interval decrease in retroperitoneal adenopathy since previous
exam, with no residual enlarged lymph nodes identified; this
suggests previous findings were reactive in etiology.
Small amount of nonspecific free pelvic fluid.
Question small hemorrhagic cyst left ovary.

## 2011-01-14 MED ORDER — IOHEXOL 300 MG/ML  SOLN
100.0000 mL | Freq: Once | INTRAMUSCULAR | Status: AC | PRN
  Administered 2011-01-14: 100 mL via INTRAVENOUS

## 2011-01-28 NOTE — Group Therapy Note (Signed)
NAME:  [name], [name]            ACCOUNT NO.:  [id number]   MEDICAL RECORD NO.:  [id number]          PATIENT TYPE:  WOC   LOCATION:  [facility]                   FACILITY:  [facility]   PHYSICIAN:  Jaynie Collins, MD     DATE OF BIRTH:  01/16/1981   DATE OF SERVICE:  04/18/2009                                  CLINIC NOTE   CHIEF COMPLAINT:  Right lower quadrant pain, admissions for pelvic  inflammatory disease in Jan 20, 2009.   HISTORY OF PRESENT ILLNESS:  The patient is a 30 year old gravida 1 who  was hospitalized from May 13 to May 16 in the setting of a fever and  pelvic inflammatory disease.  The patient was treated with triple  antibiotics and sent home with more antibiotics to complete a 14-day  course.  She did have an ultrasound during and that showed right ovarian  cyst measuring 1.7 cm in diameter.  The patient was told to follow up on  Feb 02 2009, but did not keep her appointment.  She returns today  reporting that she has a persistent pain in the right side of her  abdomen which has been persistent since that hospitalization and this  pain is radiating to the back.  She has also noticed frequent urination  over the past few weeks and reports that she has gone to the bathroom at  least 10 times.  Today, she denies any suprapubic pain with urination  and is worried more about the pain on the right side of her abdomen.  The patient has no other complaints currently.  There is a report of  possibly 2 weeks ago having some blood in her urine, which she only  noticed when she wiped after going to the bathroom, but no other  gynecologic concerns.   PHYSICAL EXAMINATION:  VITAL SIGNS:  Temperature is 97.2, pulse 81,  blood pressure 117/75, respirations 20, weight 162 pounds, height 62  inches.  GENERAL:  No apparent distress.  ABDOMEN:  Soft, mild suprapubic tenderness on palpation.  No rebound or  guarding.  BACK:  The patient does have right CVA tenderness on palpation.  PELVIC:  She has normal external female genitalia and on bimanual exam,  the patient was noted to have no cervical motion tenderness, but  tenderness on palpation of her right adnexa.  The right adnexa is fuller  than the left and is very tender on palpation on the right side.  No  uterine tenderness or left adnexal tenderness.   RESULTS FROM ADMISSION:  The patient had highest white blood cell count  of 7.5 from May 13 and her HIV, hepatitis B, and hepatitis C were  negative.  Her urinalysis was negative during her admission.  RPR was  negative.  She had a negative wet prep and gonorrhea and chlamydia  probe.   ASSESSMENT AND PLAN:  The patient is a 30 year old gravida 1, para 1 who  is here today for followup of pelvic inflammatory disease.  The patient  does report having persistent right lower quadrant pain and back pain.  On examination, she does have right adnexal fullness.  She is afebrile  right now and just seems to have mainly right ovarian pain and also  right CVA tenderness and possible urinary symptoms.  The patient might  have 2 processes happening, one being pain from her enlarged right ovary  as noted on examination and she also has CVA tenderness which could be  either as a result of an ascending urinary tract infection or possible  kidney stones.  As part of workup, the patient will get a pelvic  ultrasound to follow up her right ovarian cyst and she will also get a  renal ultrasound to workup for possible kidney stones or renal  pathology.  We will also follow a urinalysis to rule out urinary tract  infection.  In the meantime, the patient will be given a prescription  for diclofenac ER to be used as needed for pain.  She will follow up for  results of all of these studies or follow up with her primary  gynecologist, Dr. Despina Hidden, at [facility] in [place].  She was told to  come back to the emergency room for any fevers, worsening  symptoms or any other gynecologic  concerns.  Of note, the patient is  overdue for her annual Pap smear and says that she will schedule that  with her primary gynecologic [name] [name].           ______________________________  Jaynie Collins, MD     UA/MEDQ  D:  04/18/2009  T:  04/19/2009  Job:  [id number]

## 2011-01-28 NOTE — H&P (Signed)
NAME:  [name] [name], [name] [name] [name]            ACCOUNT NO.:  [id number]   MEDICAL RECORD NO.:  [id number]          PATIENT TYPE:  AMB   LOCATION:  DAY                           FACILITY:  [facility]   PHYSICIAN:  Lazaro Arms, M.D.   DATE OF BIRTH:  [birth year]   DATE OF ADMISSION:  DATE OF DISCHARGE:  [facility]                              HISTORY & PHYSICAL   [name] [name] is a 29 year old white female, gravida 0, para 0, last menstrual  period of April 08, 2007, who I saw in the office originally on March 29, 2007 because of a Pap smear which revealed low grade dysplasia with  positive HPV.  She said 3-4 years ago, she sort of had a run of abnormal  Pap smears, but they eventually returned to normal.  Colposcopic-  directed biopsies were performed, and the pathology returned as low and  high-grade dysplasia.  She does have a relatively large squamocolumnar  junction with wide extension from the endocervix as a result.  She is  admitted for laser ablation of the cervix.  She understands the  indications of this being high grade dysplasia.   PAST MEDICAL HISTORY:  Negative.   PAST SURGICAL HISTORY:  Negative.   ALLERGIES:  FLEXERIL.   PAST OB HISTORY:  She is nulliparous.   MEDICATIONS:  None.   REVIEW OF SYSTEMS:  Negative.   PHYSICAL EXAMINATION:  She weighs 140 pounds.  Blood pressure 120/80.  HEENT:  Unremarkable.  Thyroid is normal.  LUNGS:  Clear.  HEART:  Regular rate and rhythm without murmurs, rubs or gallops.  BREASTS:  Without masses, discharge, or skin changes.  ABDOMEN:  Benign.  No hepatosplenomegaly or masses.  She has normal external genitalia.  Vagina is pink and moist with no  discharge.  Cervix is nulliparous without lesion.  Uterus normal size,  shape, and contour.  Ovaries normal and nontender.  EXTREMITIES:  Warm with no edema.  NEUROLOGIC:  Grossly intact.   IMPRESSION:  High-grade cervical dysplasia.   PLAN:  Patient is admitted for laser ablation of the cervix.  She  understands risks, benefits, indications, alternatives and will proceed.      Lazaro Arms, M.D.  Electronically Signed     LHE/MEDQ  D:  04/13/2007  T:  04/13/2007  Job:  [id number]

## 2011-01-31 NOTE — Discharge Summary (Signed)
NAME:  [name] [name], Mehlani            ACCOUNT NO.:  [id number]   MEDICAL RECORD NO.:  [id number]          PATIENT TYPE:  INP   LOCATION:  9145                          FACILITY:  [facility]   PHYSICIAN:  Allie Bossier, MD        DATE OF BIRTH:  10-Apr-1981   DATE OF ADMISSION:  01/25/2009  DATE OF DISCHARGE:  01/28/2009                               DISCHARGE SUMMARY   Ms. [name] [name] is a 30 year old woman G1, P1 who showed up at the [facility] on Jan 25, 2009, complaining of pelvic pain.  She says that has been going on for several days.  Dr. Penne Lash is her  gynecologist.  The pain was in the lower left abdomen.  At that time,  her white count was normal and 7.5, and she did have a low-grade temp of  99.8.  An ultrasound done showed a small amount of free fluid in the  right ovarian cyst that measured 1.7 x 1.4, the left ovary appeared  entirely normal.  The endometrium was normal as well.  Her CMET was  normal.  She was admitted with a diagnosis of PID and was started on  amp, gent, and clinda on the late in the evening of Jan 25, 2009.  Her T-  max at approximately 0400 on Jan 27, 2009, she never had a temperature  after that.  Her abdominal exam was entirely benign, and she was sent  home on the morning of the 16th.  She is to follow up in a week in the  GYN Clinic or sooner as necessary.   DISCHARGE MEDICATIONS:  1. Darvocet-N 100 one p.o. q.4 h. p.r.n. pain, #30, no refills.  2. Doxycycline 100 mg p.o. b.i.d. for 10 days.  3. Flagyl 500 mg p.o. t.i.d. for 10 days.   She will follow up as above.      Allie Bossier, MD  Electronically Signed     MCD/MEDQ  D:  02/23/2009  T:  02/24/2009  Job:  [id number]

## 2011-02-11 ENCOUNTER — Ambulatory Visit (INDEPENDENT_AMBULATORY_CARE_PROVIDER_SITE_OTHER): Payer: PRIVATE HEALTH INSURANCE | Admitting: Internal Medicine

## 2011-02-25 ENCOUNTER — Ambulatory Visit (INDEPENDENT_AMBULATORY_CARE_PROVIDER_SITE_OTHER): Payer: PRIVATE HEALTH INSURANCE | Admitting: Internal Medicine

## 2011-03-12 ENCOUNTER — Inpatient Hospital Stay (INDEPENDENT_AMBULATORY_CARE_PROVIDER_SITE_OTHER)
Admission: RE | Admit: 2011-03-12 | Discharge: 2011-03-12 | Disposition: A | Discharge status: Home / Self Care | Payer: PRIVATE HEALTH INSURANCE | Source: Ambulatory Visit | Attending: Family Medicine | Admitting: Family Medicine

## 2011-03-12 DIAGNOSIS — K089 Disorder of teeth and supporting structures, unspecified: Secondary | ICD-10-CM

## 2011-03-12 LAB — POCT RAPID STREP A: Streptococcus, Group A Screen (Direct): NEGATIVE

## 2011-06-05 LAB — URINALYSIS, ROUTINE W REFLEX MICROSCOPIC
Glucose, UA: NEGATIVE
Nitrite: NEGATIVE
Protein, ur: NEGATIVE
pH: 6

## 2011-06-05 LAB — URINE MICROSCOPIC-ADD ON

## 2011-06-09 LAB — URINALYSIS, ROUTINE W REFLEX MICROSCOPIC
Glucose, UA: NEGATIVE
Leukocytes, UA: NEGATIVE
Protein, ur: NEGATIVE
Specific Gravity, Urine: <1.005 — ABNORMAL LOW
Urobilinogen, UA: 0.2

## 2011-06-09 LAB — URINE MICROSCOPIC-ADD ON

## 2011-06-09 LAB — WET PREP, GENITAL: Trich, Wet Prep: NONE SEEN

## 2011-06-10 LAB — RPR: RPR Ser Ql: NONREACTIVE

## 2011-06-10 LAB — CBC
Hemoglobin: 12.9
MCHC: 35
RBC: 4.11
RDW: 12.2

## 2011-06-26 LAB — URINALYSIS, ROUTINE W REFLEX MICROSCOPIC
Ketones, ur: NEGATIVE
Leukocytes, UA: NEGATIVE
Nitrite: NEGATIVE
Protein, ur: NEGATIVE

## 2011-06-26 LAB — WET PREP, GENITAL: WBC, Wet Prep HPF POC: NONE SEEN

## 2011-06-26 LAB — HCG, QUANTITATIVE, PREGNANCY: hCG, Beta Chain, Quant, S: 72622 — ABNORMAL HIGH

## 2011-06-30 LAB — CBC
HCT: 37.3
Hemoglobin: 12.9
RBC: 4.39
WBC: 5.8

## 2011-06-30 LAB — URINALYSIS, ROUTINE W REFLEX MICROSCOPIC
Glucose, UA: NEGATIVE
Protein, ur: NEGATIVE
Specific Gravity, Urine: 1.01
pH: 6.5

## 2011-08-11 ENCOUNTER — Emergency Department (HOSPITAL_COMMUNITY)
Admission: EM | Admit: 2011-08-11 | Discharge: 2011-08-11 | Disposition: A | Discharge status: Home / Self Care | Payer: BC Managed Care – PPO | Source: Home / Self Care | Attending: Family Medicine | Admitting: Family Medicine

## 2011-08-11 ENCOUNTER — Emergency Department (INDEPENDENT_AMBULATORY_CARE_PROVIDER_SITE_OTHER): Payer: BC Managed Care – PPO

## 2011-08-11 ENCOUNTER — Encounter: Payer: Self-pay | Admitting: *Deleted

## 2011-08-11 DIAGNOSIS — J189 Pneumonia, unspecified organism: Secondary | ICD-10-CM

## 2011-08-11 IMAGING — CR DG CHEST 2V
2 series · 2 of 2 positions shown · non-contrast
Comparison: None

CLINICAL DATA: Cough for 2 days

CHEST - 2 VIEW

[view not recorded (1 of 2)]
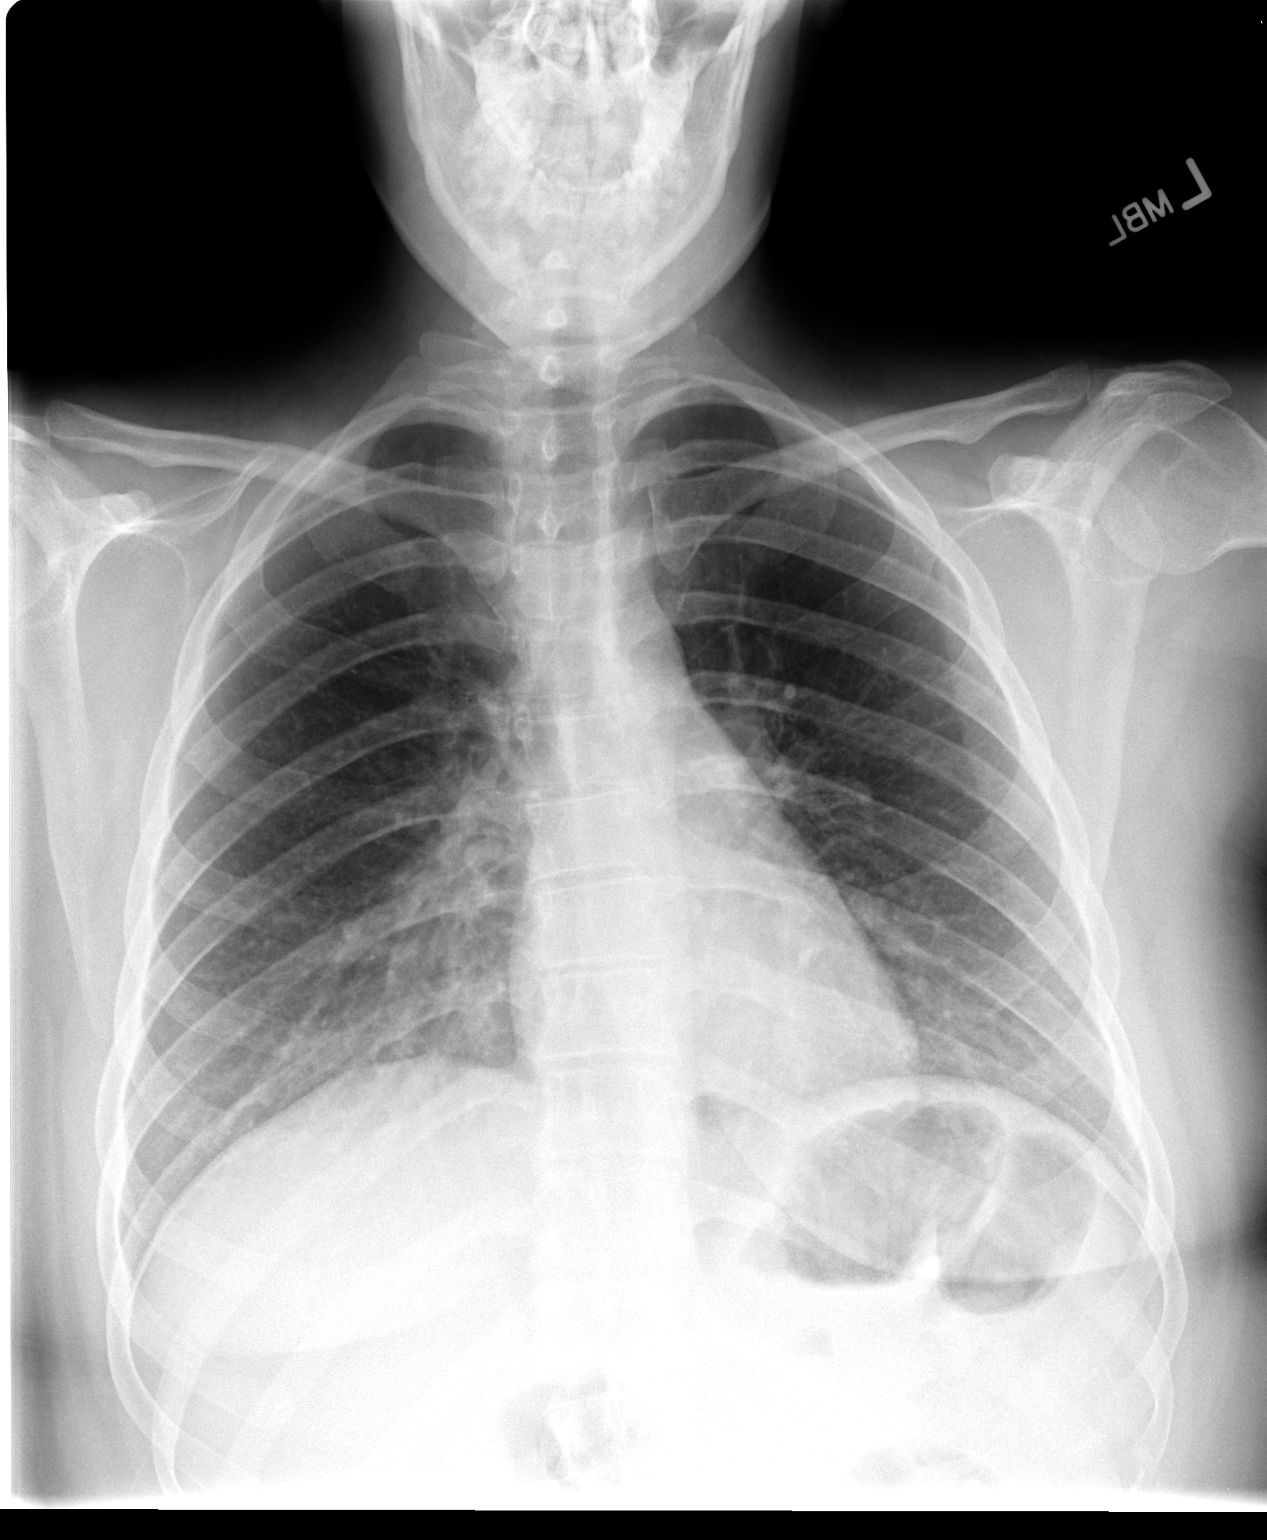

[view not recorded (2 of 2)]
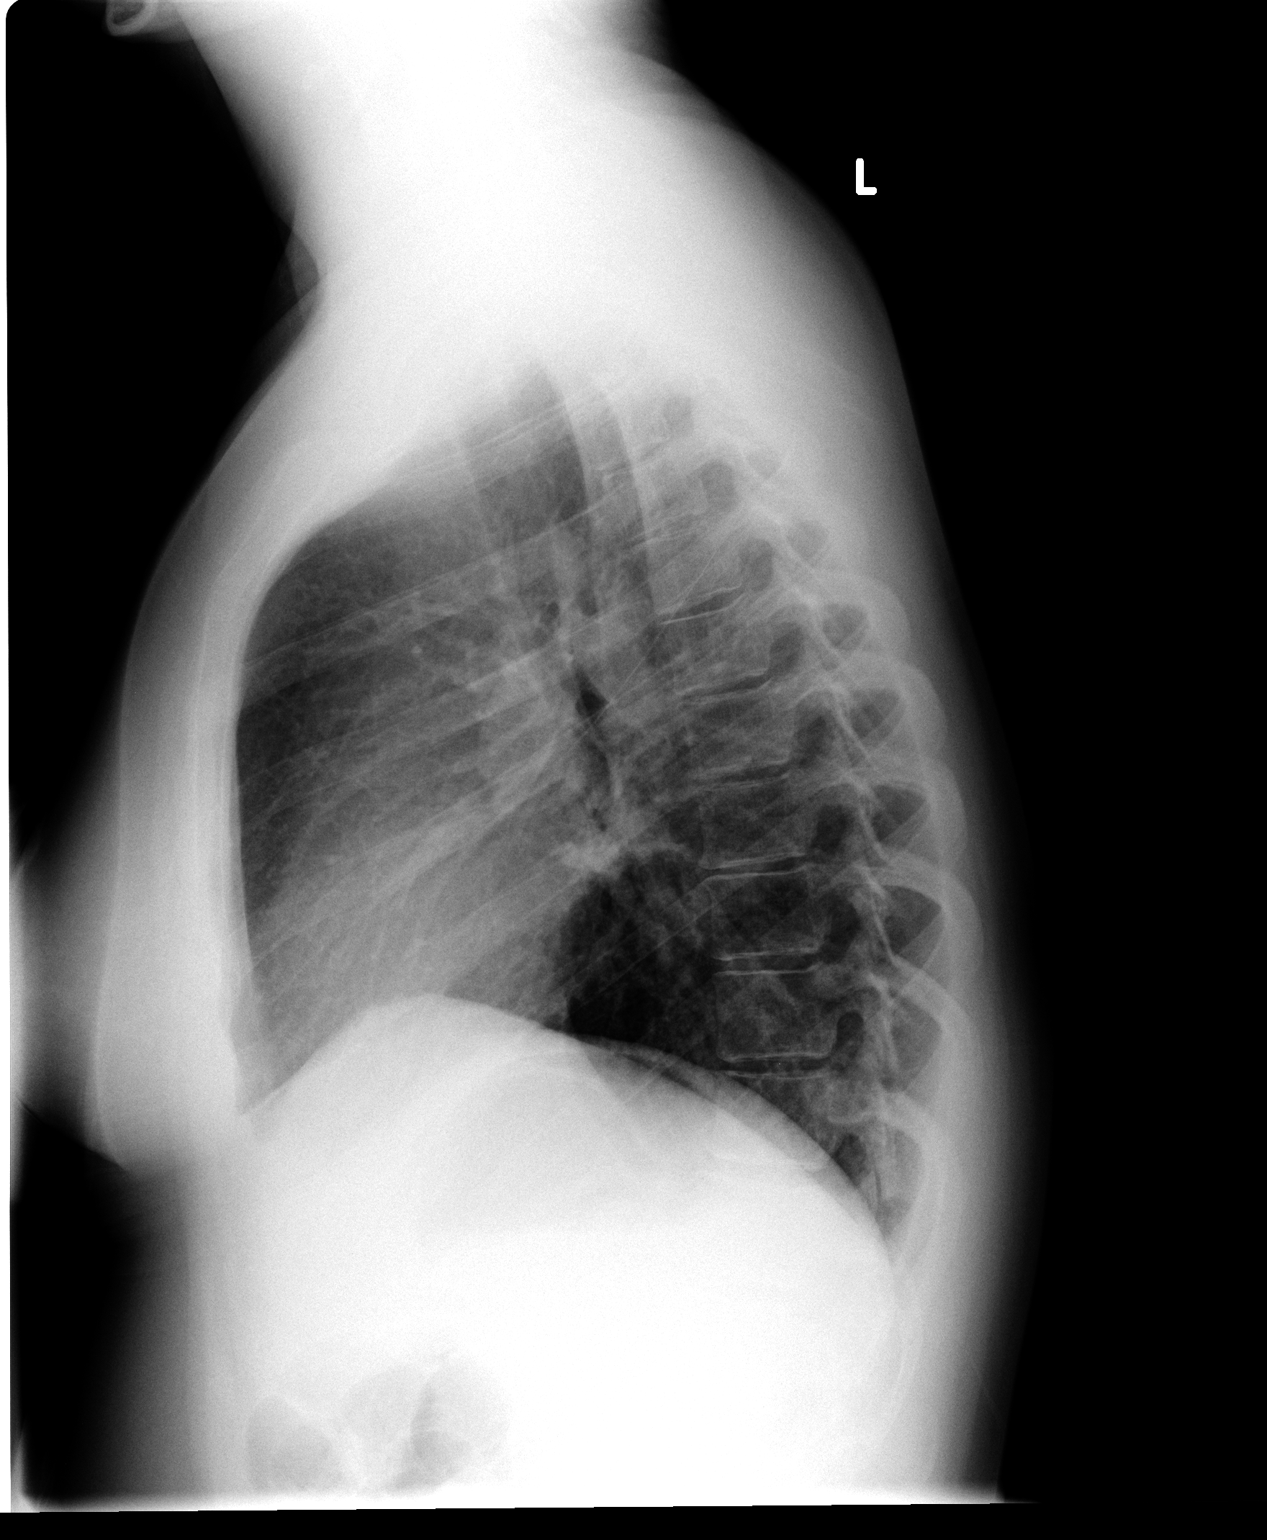

[2 of 2 positions shown; findings below may reference images not displayed]

FINDINGS: Normal heart size, mediastinal contours, and pulmonary vascularity.
Minimal peribronchial thickening.
Increased right infrahilar markings on PA view, slightly increased
versus previous study, cannot exclude subtle medial right lower
lobe infiltrate.
Remaining lungs clear.
No pleural effusion or pneumothorax.
Bones unremarkable.
IMPRESSION: Increased right infrahilar markings, cannot exclude subtle medial
right lower lobe infiltrate.

## 2011-08-11 MED ORDER — AZITHROMYCIN 250 MG PO TABS
ORAL_TABLET | ORAL | Status: AC
  Filled 2011-08-11: qty 2

## 2011-08-11 MED ORDER — AZITHROMYCIN 250 MG PO TABS
500.0000 mg | ORAL_TABLET | Freq: Once | ORAL | Status: AC
  Administered 2011-08-11: 500 mg via ORAL

## 2011-08-11 MED ORDER — HYDROCOD POLST-CHLORPHEN POLST 10-8 MG/5ML PO LQCR: 5.0000 mL | Freq: Two times a day (BID) | ORAL | Status: DC | PRN

## 2011-08-11 MED ORDER — AZITHROMYCIN 250 MG PO TABS: ORAL_TABLET | ORAL | Status: AC

## 2011-08-11 NOTE — ED Provider Notes (Signed)
History     CSN: [id number] Arrival date & time: 08/11/2011  4:41 PM   First MD Initiated Contact with Patient 08/11/11 1606      Chief Complaint  Patient presents with  . Cough  . Nasal Congestion  . Fever    (Consider location/radiation/quality/duration/timing/severity/associated sxs/prior treatment) Patient is a 30 y.o. female presenting with cough and fever. The history is provided by the patient.  Cough This is a new problem. The current episode started 6 to 12 hours ago. The problem occurs constantly. The problem has been gradually worsening. The cough is non-productive. There has been no fever. Associated symptoms include rhinorrhea. Pertinent negatives include no shortness of breath. She is not a smoker.  Fever Primary symptoms of the febrile illness include fever and cough. Primary symptoms do not include shortness of breath.    No past medical history on file.  No past surgical history on file.  No family history on file.  History  Substance Use Topics  . Smoking status: Not on file  . Smokeless tobacco: Not on file  . Alcohol Use: Not on file    OB History    Grav Para Term Preterm Abortions TAB SAB Ect Mult Living                  Review of Systems  Constitutional: Positive for fever.  HENT: Positive for congestion, rhinorrhea and postnasal drip.   Respiratory: Positive for cough. Negative for shortness of breath.   Cardiovascular: Negative.   Gastrointestinal: Negative.   Skin: Negative.     Allergies  Flexeril  Home Medications  No current outpatient prescriptions on file.  BP 139/71  Pulse 116  Temp(Src) 99.9 F (37.7 C) (Oral)  Resp 18  SpO2 100%  Physical Exam  Constitutional: She appears well-developed and well-nourished.  HENT:  Head: Normocephalic.  Right Ear: External ear normal.  Left Ear: External ear normal.  Nose: Nose normal.  Mouth/Throat: Oropharynx is clear and moist.  Eyes: Conjunctivae and EOM are normal. Pupils  are equal, round, and reactive to light.  Neck: Normal range of motion. Neck supple.  Cardiovascular: Normal rate, normal heart sounds and intact distal pulses.   Pulmonary/Chest: Effort normal and breath sounds normal.  Abdominal: Soft. Bowel sounds are normal.  Lymphadenopathy:    She has no cervical adenopathy.  Neurological: She is alert.  Skin: Skin is warm and dry.    ED Course  Procedures (including critical care time)  Labs Reviewed - No data to display No results found.   No diagnosis found.    MDM          Barkley Bruns, MD 08/12/11 [phone]

## 2011-08-11 NOTE — ED Notes (Signed)
Pt c/o a cough, congestion, fever that "started last night". Denies GI s/s.

## 2012-01-14 ENCOUNTER — Other Ambulatory Visit: Payer: Self-pay | Admitting: Family Medicine

## 2012-01-14 ENCOUNTER — Ambulatory Visit (HOSPITAL_COMMUNITY)
Admission: RE | Admit: 2012-01-14 | Discharge: 2012-01-14 | Disposition: A | Discharge status: Home / Self Care | Payer: BC Managed Care – PPO | Source: Ambulatory Visit | Attending: Family Medicine | Admitting: Family Medicine

## 2012-01-14 DIAGNOSIS — M25562 Pain in left knee: Secondary | ICD-10-CM

## 2012-01-14 DIAGNOSIS — M25569 Pain in unspecified knee: Secondary | ICD-10-CM | POA: Insufficient documentation

## 2012-01-14 IMAGING — CR DG KNEE COMPLETE 4+V*L*
4 series · 4 of 4 positions shown · non-contrast
Comparison: None

CLINICAL DATA: Left knee pain for several months

LEFT KNEE - COMPLETE 4+ VIEW

[view not recorded (1 of 4)]
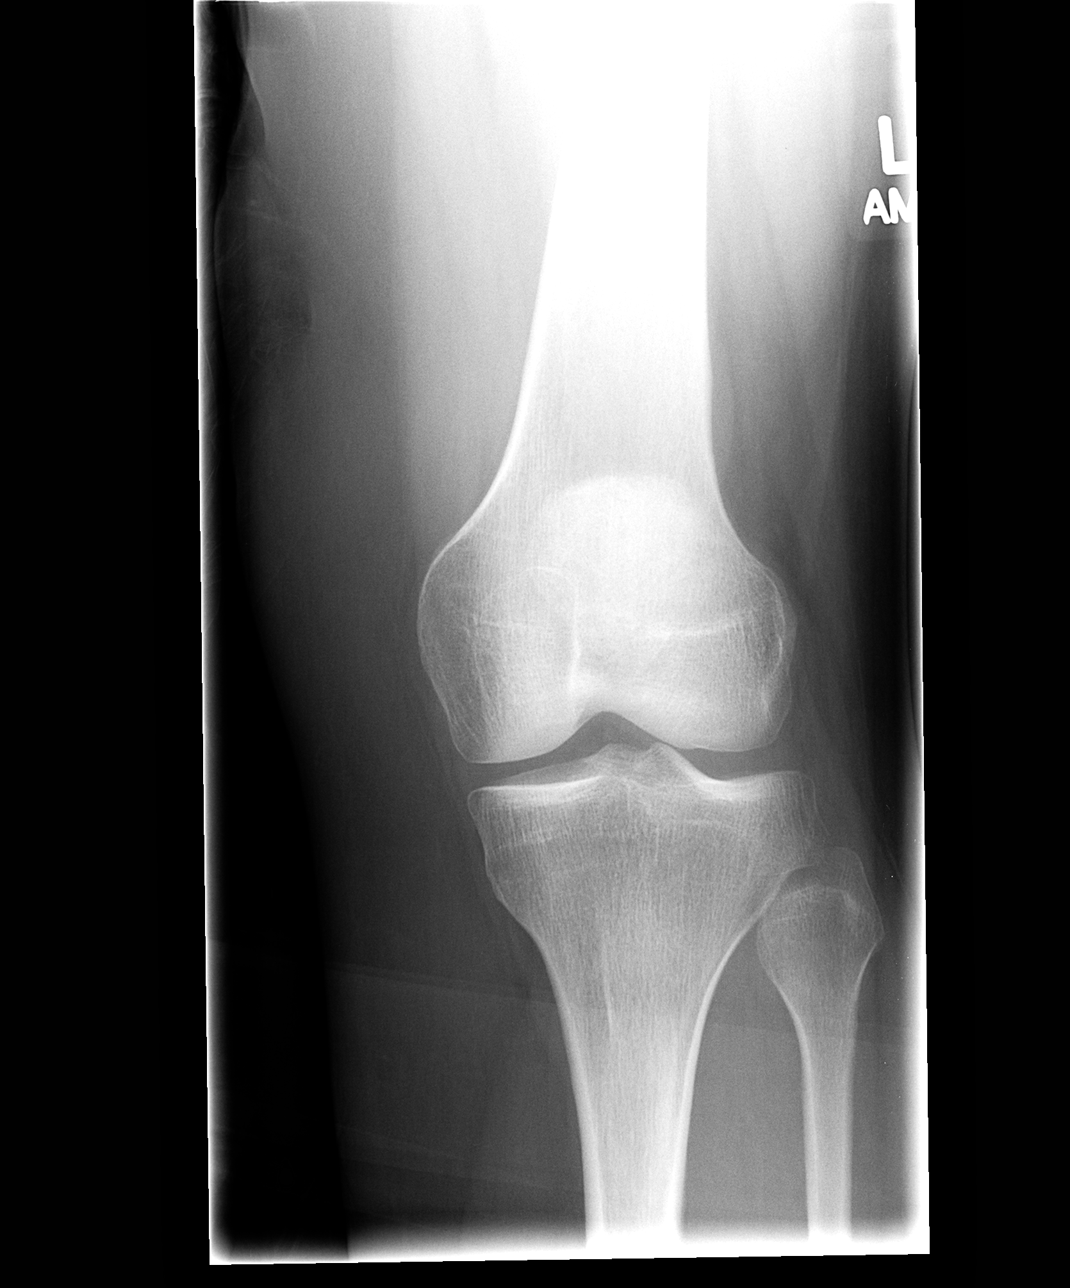

[view not recorded (2 of 4)]
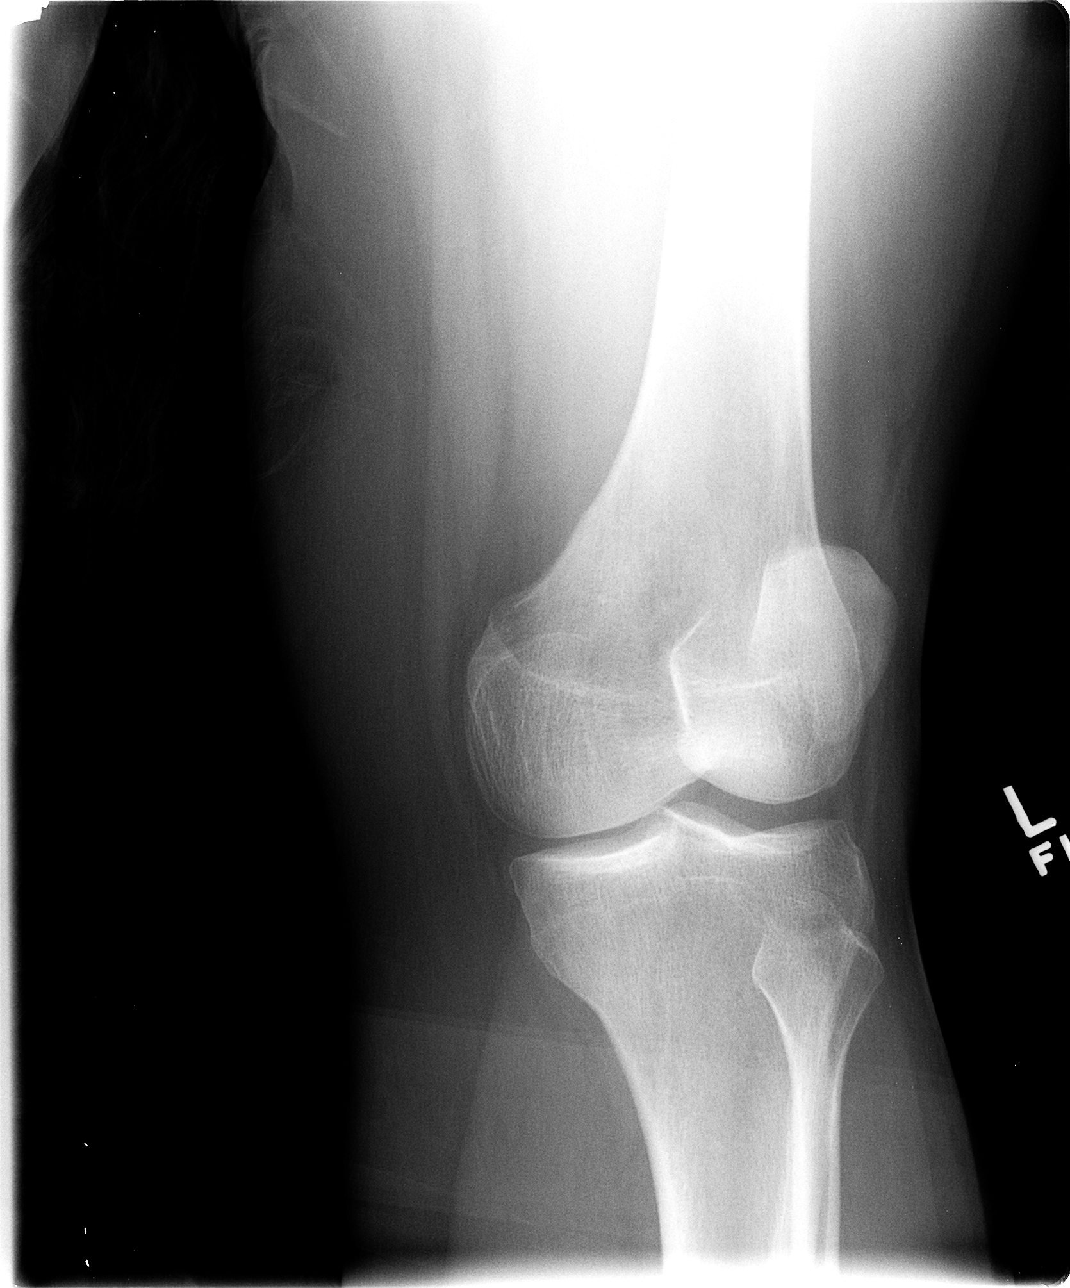

[view not recorded (3 of 4)]
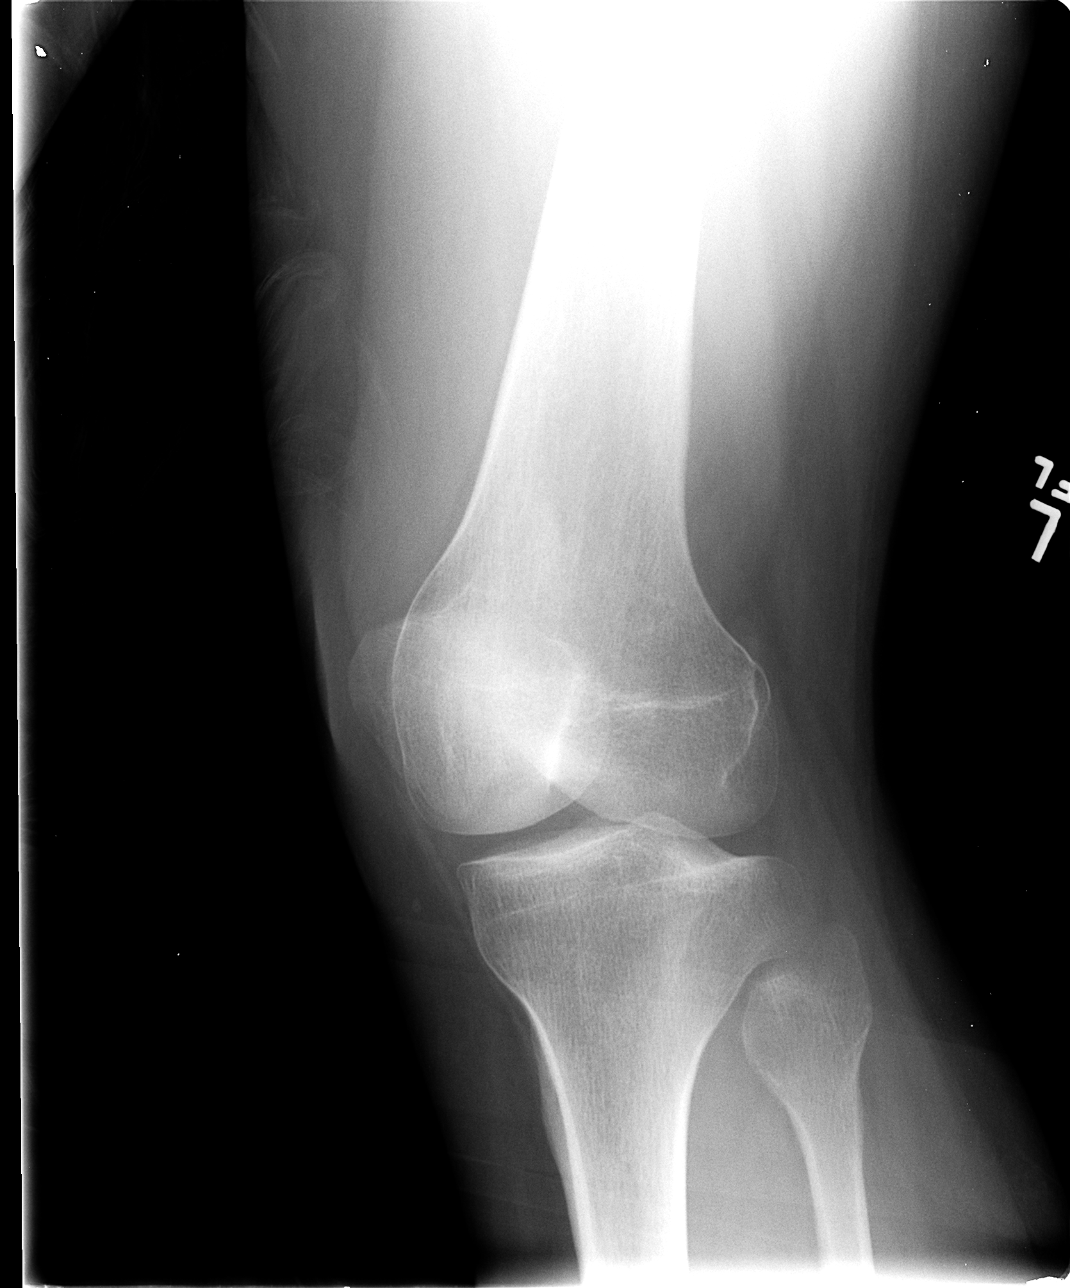

[view not recorded (4 of 4)]
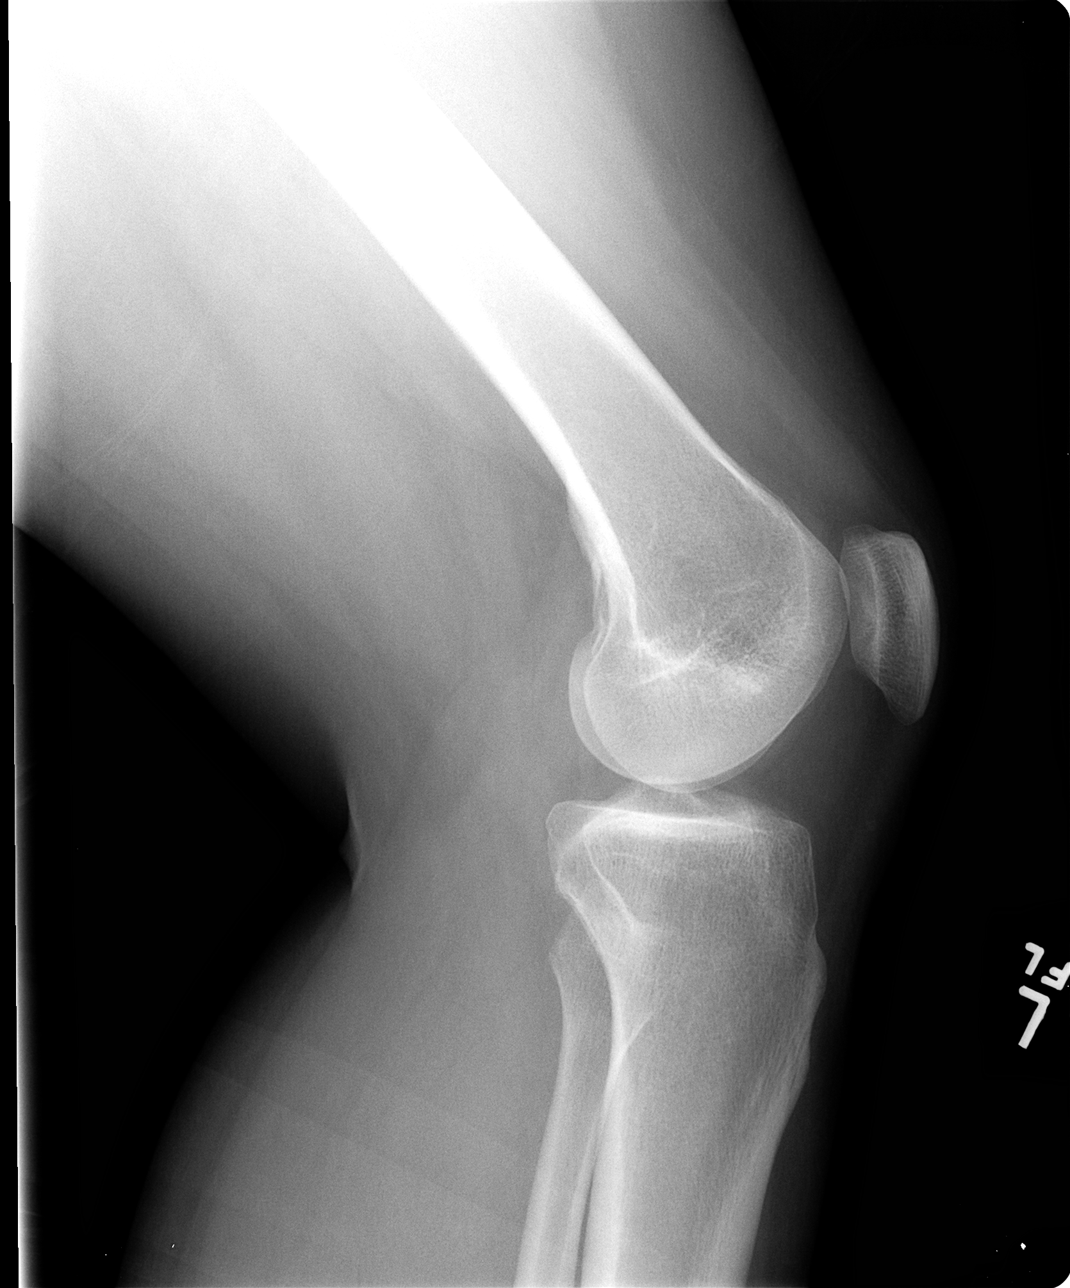

[4 of 4 positions shown; findings below may reference images not displayed]

FINDINGS: Bone mineralization normal.
Joint spaces preserved.
No fracture, dislocation, or bone destruction.
No joint effusion.
IMPRESSION: No acute abnormalities.

## 2012-02-26 ENCOUNTER — Emergency Department (HOSPITAL_COMMUNITY)
Admission: EM | Admit: 2012-02-26 | Discharge: 2012-02-26 | Disposition: A | Discharge status: Home / Self Care | Payer: BC Managed Care – PPO | Attending: Emergency Medicine | Admitting: Emergency Medicine

## 2012-02-26 ENCOUNTER — Encounter (HOSPITAL_COMMUNITY): Payer: Self-pay | Admitting: Emergency Medicine

## 2012-02-26 DIAGNOSIS — R109 Unspecified abdominal pain: Secondary | ICD-10-CM | POA: Insufficient documentation

## 2012-02-26 DIAGNOSIS — K529 Noninfective gastroenteritis and colitis, unspecified: Secondary | ICD-10-CM

## 2012-02-26 LAB — DIFFERENTIAL
Basophils Absolute: 0 10*3/uL (ref 0.0–0.1)
Lymphocytes Relative: 43 % (ref 12–46)
Lymphs Abs: 2.7 10*3/uL (ref 0.7–4.0)
Monocytes Absolute: 0.4 10*3/uL (ref 0.1–1.0)
Neutro Abs: 3.2 10*3/uL (ref 1.7–7.7)

## 2012-02-26 LAB — URINE MICROSCOPIC-ADD ON

## 2012-02-26 LAB — URINALYSIS, ROUTINE W REFLEX MICROSCOPIC
Glucose, UA: NEGATIVE mg/dL
Hgb urine dipstick: NEGATIVE
Ketones, ur: NEGATIVE mg/dL
Protein, ur: NEGATIVE mg/dL
pH: 5.5 (ref 5.0–8.0)

## 2012-02-26 LAB — BASIC METABOLIC PANEL
CO2: 23 mEq/L (ref 19–32)
Chloride: 104 mEq/L (ref 96–112)
Creatinine, Ser: 0.74 mg/dL (ref 0.50–1.10)
Sodium: 138 mEq/L (ref 135–145)

## 2012-02-26 LAB — POCT PREGNANCY, URINE: Preg Test, Ur: NEGATIVE

## 2012-02-26 LAB — CBC
HCT: 38.4 % (ref 36.0–46.0)
RBC: 4.52 MIL/uL (ref 3.87–5.11)
RDW: 12 % (ref 11.5–15.5)
WBC: 6.3 10*3/uL (ref 4.0–10.5)

## 2012-02-26 MED ORDER — ONDANSETRON HCL 4 MG/2ML IJ SOLN
4.0000 mg | Freq: Once | INTRAMUSCULAR | Status: AC
  Administered 2012-02-26: 4 mg via INTRAVENOUS
  Filled 2012-02-26 (×2): qty 2

## 2012-02-26 MED ORDER — OXYCODONE-ACETAMINOPHEN 5-325 MG PO TABS: 1.0000 | ORAL_TABLET | ORAL | Status: AC | PRN

## 2012-02-26 MED ORDER — METRONIDAZOLE 500 MG PO TABS: 500.0000 mg | ORAL_TABLET | Freq: Two times a day (BID) | ORAL | Status: AC

## 2012-02-26 MED ORDER — MORPHINE SULFATE 4 MG/ML IJ SOLN
4.0000 mg | Freq: Once | INTRAMUSCULAR | Status: AC
  Administered 2012-02-26: 4 mg via INTRAVENOUS
  Filled 2012-02-26 (×2): qty 1

## 2012-02-26 MED ORDER — CIPROFLOXACIN HCL 500 MG PO TABS: 500.0000 mg | ORAL_TABLET | Freq: Two times a day (BID) | ORAL | Status: AC

## 2012-02-26 NOTE — ED Notes (Addendum)
Patient states she lower abdominal pain and bleeding when she passes gas x 1 day with liquid stool with nausea. Patient denies V/F.

## 2012-02-26 NOTE — ED Notes (Signed)
IV Team paged and returned call. IC Team will access patient asap.

## 2012-02-26 NOTE — ED Provider Notes (Signed)
History     CSN: [id number]  Arrival date & time 02/26/12  0931   First MD Initiated Contact with Patient 02/26/12 0940      Chief complaint - abdominal pain  Patient is a 31 y.o. female presenting with abdominal pain. The history is provided by the patient.  Abdominal Pain The primary symptoms of the illness include abdominal pain, nausea, diarrhea and hematochezia. The primary symptoms of the illness do not include vomiting. The current episode started yesterday. The onset of the illness was gradual. The problem has been gradually worsening.  The patient has had a change in bowel habit. Additional symptoms associated with the illness include chills. Symptoms associated with the illness do not include back pain.  pt reports abdominal cramping since yesterday with associated diarrhea with blood mixed in stool No melena No vomiting No fever No vaginal bleeding She has never had this before Reports multiple episodes of diarrhea  PMH - none  Past Surgical History  Procedure Date  . Wisdom tooth extraction     No family history on file.  History  Substance Use Topics  . Smoking status: Unknown If Ever Smoked  . Smokeless tobacco: Not on file  . Alcohol Use: No    OB History    Grav Para Term Preterm Abortions TAB SAB Ect Mult Living                  Review of Systems  Constitutional: Positive for chills.  Gastrointestinal: Positive for nausea, abdominal pain, diarrhea and hematochezia. Negative for vomiting.  Musculoskeletal: Negative for back pain.  All other systems reviewed and are negative.    Allergies  Flexeril  Home Medications   Current Outpatient Rx  Name Route Sig Dispense Refill  . ALPRAZOLAM 0.5 MG PO TABS Oral Take 0.5 mg by mouth at bedtime as needed.    . ETODOLAC 400 MG PO TABS Oral Take 400 mg by mouth 2 (two) times daily with a meal.    . IBUPROFEN 200 MG PO TABS Oral Take 600 mg by mouth 2 (two) times daily as needed.      BP 130/74   Pulse 83  Temp 98.1 F (36.7 C) (Oral)  Resp 16  SpO2 98%  Physical Exam CONSTITUTIONAL: Well developed/well nourished HEAD AND FACE: Normocephalic/atraumatic EYES: EOMI/PERRL ENMT: Mucous membranes moist NECK: supple no meningeal signs SPINE:entire spine nontender CV: S1/S2 noted, no murmurs/rubs/gallops noted LUNGS: Lungs are clear to auscultation bilaterally, no apparent distress ABDOMEN: soft, nontender, no rebound or guarding GU:no cva tenderness Rectal - minimal stool in vault, no melena, no blood noted, chaperone present NEURO: Pt is awake/alert, moves all extremitiesx4 EXTREMITIES: pulses normal, full ROM SKIN: warm, color normal PSYCH: no abnormalities of mood noted  ED Course  Procedures   Labs Reviewed  CBC  DIFFERENTIAL  BASIC METABOLIC PANEL  OCCULT BLOOD X 1 CARD TO LAB, STOOL  URINALYSIS, ROUTINE W REFLEX MICROSCOPIC   Pt improved, sitting up in bed, no distress, could have early colitis given self reported h/o bloody stool.   Do not feel imaging is warranted Stable for d/c Cipro/flagyll ordered We discussed strict return precautions  The patient appears reasonably screened and/or stabilized for discharge and I doubt any other medical condition or other [facility] requiring further screening, evaluation, or treatment in the ED at this time prior to discharge.    MDM  Nursing notes including past medical history and social history reviewed and considered in documentation All labs/vitals reviewed and considered  Joya Gaskins, MD 02/26/12 1556

## 2012-04-03 ENCOUNTER — Emergency Department (HOSPITAL_COMMUNITY)
Admission: EM | Admit: 2012-04-03 | Discharge: 2012-04-03 | Disposition: A | Discharge status: Home / Self Care | Payer: BC Managed Care – PPO | Attending: Emergency Medicine | Admitting: Emergency Medicine

## 2012-04-03 ENCOUNTER — Encounter (HOSPITAL_COMMUNITY): Payer: Self-pay | Admitting: *Deleted

## 2012-04-03 DIAGNOSIS — T380X5A Adverse effect of glucocorticoids and synthetic analogues, initial encounter: Secondary | ICD-10-CM | POA: Insufficient documentation

## 2012-04-03 DIAGNOSIS — T7840XA Allergy, unspecified, initial encounter: Secondary | ICD-10-CM

## 2012-04-03 MED ORDER — FAMOTIDINE 20 MG PO TABS
20.0000 mg | ORAL_TABLET | Freq: Once | ORAL | Status: AC
  Administered 2012-04-03: 20 mg via ORAL
  Filled 2012-04-03: qty 1

## 2012-04-03 MED ORDER — EPINEPHRINE 0.3 MG/0.3ML IJ DEVI: 0.3000 mg | Freq: Once | INTRAMUSCULAR | Status: DC

## 2012-04-03 MED ORDER — FAMOTIDINE 20 MG PO TABS: 20.0000 mg | ORAL_TABLET | Freq: Two times a day (BID) | ORAL | Status: DC

## 2012-04-03 MED ORDER — EPINEPHRINE 0.3 MG/0.3ML IJ DEVI
0.3000 mg | Freq: Once | INTRAMUSCULAR | Status: AC
  Administered 2012-04-03: 0.3 mg via INTRAMUSCULAR
  Filled 2012-04-03: qty 0.3

## 2012-04-03 NOTE — ED Notes (Signed)
Patient with red face, neck, chest and upper arms.  Patient states she had a cortisone shot two days ago, never has had steroids in the past.  Patient does have scratchy throat that started this evening.  No shortness of breath, no cough.  Clear breath sounds.

## 2012-04-03 NOTE — ED Notes (Signed)
She had a cortisone injection into her lt knee Thursday.  Since then she has had periods of sweating her face has been blood red and today her throat has been feeking strange.  She has itching around her face and her shoulders

## 2012-04-03 NOTE — ED Provider Notes (Signed)
Medical screening examination/treatment/procedure(s) were performed by non-physician practitioner and as supervising physician I was immediately available for consultation/collaboration.   Selby Slovacek W Margaretann Abate, MD 04/03/12 0748 

## 2012-04-03 NOTE — ED Provider Notes (Signed)
History     CSN: [id number]  Arrival date & time 04/03/12  0204   First MD Initiated Contact with Patient 04/03/12 0410      Chief Complaint  Patient presents with  . adverse reaction to meds     (Consider location/radiation/quality/duration/timing/severity/associated sxs/prior treatment) HPI Comments: Patient states, that she had a cortisone injection in her knee on Thursday, approximately 2 hours later, she developed symptoms of facial flushing, a strange feeling in her throat, intermittent, tachycardia and shortness of breath.  This has been persistent despite her use of Benadryl.  She last took a Benadryl at 1 AM without relief of her symptoms.  She denies nausea, vomiting, previous reactions to any medications  The history is provided by the patient.    History reviewed. No pertinent past medical history.  Past Surgical History  Procedure Date  . Wisdom tooth extraction     No family history on file.  History  Substance Use Topics  . Smoking status: Unknown If Ever Smoked  . Smokeless tobacco: Not on file  . Alcohol Use: No    OB History    Grav Para Term Preterm Abortions TAB SAB Ect Mult Living                  Review of Systems  Constitutional: Negative for fever and chills.  Respiratory: Negative for shortness of breath and wheezing.   Gastrointestinal: Positive for nausea.  Skin: Negative for pallor and wound.  Neurological: Negative for dizziness, weakness and numbness.    Allergies  Flexeril  Home Medications   Current Outpatient Rx  Name Route Sig Dispense Refill  . ALPRAZOLAM 0.5 MG PO TABS Oral Take 0.5 mg by mouth at bedtime as needed. For sleep    . DIPHENHYDRAMINE HCL 25 MG PO CAPS Oral Take 25 mg by mouth every 6 (six) hours as needed. For allergies    . TRAMADOL HCL 50 MG PO TABS Oral Take 50 mg by mouth every 6 (six) hours as needed. For pain      BP 120/70  Pulse 75  Temp 97 F (36.1 C) (Oral)  Resp 14  SpO2 100%  Physical  Exam  Constitutional: She is oriented to person, place, and time. She appears well-developed and well-nourished. She appears distressed.  HENT:  Head: Normocephalic.  Mouth/Throat: Uvula is midline. No uvula swelling. No posterior oropharyngeal edema or posterior oropharyngeal erythema.       Voice is muffled  Eyes: Pupils are equal, round, and reactive to light.  Neck: Normal range of motion.  Cardiovascular: Normal rate.   Pulmonary/Chest: Effort normal.  Abdominal: Soft. Bowel sounds are normal. She exhibits no distension.  Musculoskeletal: Normal range of motion.  Neurological: She is alert and oriented to person, place, and time.  Skin: She is not diaphoretic.       Facial flushing    ED Course  Procedures (including critical care time)  Labs Reviewed - No data to display No results found.   No diagnosis found.    MDM  Will treat with PO Pepcid and Epi Pen  5:45 AM.  Patient reevaluated.  She is now speaking with a clear voice.  She states her throat is no free or longer feeling thick.  She is not as flushed, in the face.  She is feeling, a little shaky from a side effect of epinephrine.  We'll continue to observe her for short period of her.  We'll send home with an EpiPen and Pepcid,  and have her followup with her primary care provider        Arman Filter, NP 04/03/12 [phone]

## 2012-07-25 ENCOUNTER — Emergency Department (HOSPITAL_COMMUNITY)
Admission: EM | Admit: 2012-07-25 | Discharge: 2012-07-25 | Disposition: A | Discharge status: Home / Self Care | Payer: BC Managed Care – PPO | Source: Home / Self Care | Attending: Family Medicine | Admitting: Family Medicine

## 2012-07-25 ENCOUNTER — Encounter (HOSPITAL_COMMUNITY): Payer: Self-pay | Admitting: *Deleted

## 2012-07-25 DIAGNOSIS — M62830 Muscle spasm of back: Secondary | ICD-10-CM

## 2012-07-25 DIAGNOSIS — M5380 Other specified dorsopathies, site unspecified: Secondary | ICD-10-CM

## 2012-07-25 HISTORY — DX: Noninfective gastroenteritis and colitis, unspecified: K52.9

## 2012-07-25 HISTORY — DX: Gastro-esophageal reflux disease without esophagitis: K21.9

## 2012-07-25 MED ORDER — HYDROCODONE-ACETAMINOPHEN 5-500 MG PO TABS: 1.0000 | ORAL_TABLET | Freq: Three times a day (TID) | ORAL | Status: DC | PRN

## 2012-07-25 MED ORDER — METHOCARBAMOL 500 MG PO TABS: 500.0000 mg | ORAL_TABLET | Freq: Two times a day (BID) | ORAL | Status: DC

## 2012-07-25 MED ORDER — IBUPROFEN 600 MG PO TABS: 600.0000 mg | ORAL_TABLET | Freq: Three times a day (TID) | ORAL | Status: DC | PRN

## 2012-07-25 NOTE — ED Provider Notes (Signed)
History     CSN: [id number]  Arrival date & time 07/25/12  1031   First MD Initiated Contact with Patient 07/25/12 1052      Chief Complaint  Patient presents with  . Back Pain    (Consider location/radiation/quality/duration/timing/severity/associated sxs/prior treatment) HPI Comments: 31 year old female here complaining of right lower side back pain for 2 days. Patient reports he's been extensively at home where she has been bending and picking things constantly last week. Denies flank pain burning on urination, frequency or hematuria. Patient reported pain mostly on the lower back towards the right gluteal area. Patient worse with bending. Denies low extremity numbness, weakness or paresthesia. Patient reports that she sleeps with her hands over her head and sometimes wakes up with numbness sensation in her digits that were self immediately few minutes after getting up from bed. Has taken Bayer/sprain and leave inconsistently with no relief.   Past Medical History  Diagnosis Date  . Migraine   . Colitis   . GERD (gastroesophageal reflux disease)     Past Surgical History  Procedure Date  . Wisdom tooth extraction     No family history on file.  History  Substance Use Topics  . Smoking status: Never Smoker   . Smokeless tobacco: Not on file  . Alcohol Use: No    OB History    Grav Para Term Preterm Abortions TAB SAB Ect Mult Living                  Review of Systems  Constitutional: Negative for fever, chills and fatigue.  Gastrointestinal: Negative for nausea, vomiting, abdominal pain, diarrhea and constipation.  Genitourinary: Negative for dysuria, frequency, hematuria, flank pain, vaginal bleeding, vaginal discharge and pelvic pain.  Musculoskeletal: Positive for back pain. Negative for joint swelling.  Skin: Negative for rash.  Neurological: Negative for dizziness, weakness, numbness and headaches.  All other systems reviewed and are  negative.    Allergies  Cortisone and Flexeril  Home Medications   Current Outpatient Rx  Name  Route  Sig  Dispense  Refill  . ALPRAZOLAM 0.5 MG PO TABS   Oral   Take 0.5 mg by mouth at bedtime as needed. For sleep         . EPINEPHRINE 0.3 MG/0.3ML IJ DEVI   Intramuscular   Inject 0.3 mLs (0.3 mg total) into the muscle once.   1 Device   2   . NORELGESTROMIN-ETH ESTRADIOL 150-20 MCG/24HR TD PTWK   Transdermal   Place 1 patch onto the skin once a week.         Marland Kitchen RIZATRIPTAN BENZOATE PO   Oral   Take by mouth. Prn migraines         . HYDROCODONE-ACETAMINOPHEN 5-500 MG PO TABS   Oral   Take 1 tablet by mouth every 8 (eight) hours as needed for pain.   15 tablet   0   . IBUPROFEN 600 MG PO TABS   Oral   Take 1 tablet (600 mg total) by mouth every 8 (eight) hours as needed for pain.   20 tablet   0   . METHOCARBAMOL 500 MG PO TABS   Oral   Take 1 tablet (500 mg total) by mouth 2 (two) times daily.   20 tablet   0     BP 135/97  Pulse 94  Temp 98.7 F (37.1 C) (Oral)  Resp 16  SpO2 100%  LMP 07/03/2012  Physical Exam  Nursing note and  vitals reviewed. Constitutional: She is oriented to person, place, and time. She appears well-developed and well-nourished. No distress.  HENT:  Head: Normocephalic and atraumatic.  Eyes: Conjunctivae normal are normal. No scleral icterus.  Neck: Neck supple.  Cardiovascular: Normal heart sounds.   Pulmonary/Chest: Breath sounds normal.  Abdominal: Soft. She exhibits no distension. There is no tenderness.  Musculoskeletal:       Spine central: No obvious scoliosis or kyphosis. No pain over bone processes in entire spine.  Good range of motion despite reported severe pain.  Able to bend forward (flexion) past 90 degrees with reported discomfort .  Tenderness to palpation and impress increase tone in the lumbo/sacral paravertebral muscles on the right side. Negative straight raise leg test. Entire right lower  extremity appears neurovascularly intact.  Neurological: She is alert and oriented to person, place, and time.  Skin: No rash noted. She is not diaphoretic.    ED Course  Procedures (including critical care time)  Labs Reviewed - No data to display No results found.   1. Muscle spasm of back       MDM  Treated with Vicodin, ibuprofen and Robaxin. Back exercises and supportive care discussed with patient and provided in writing. Orthopedic referral for follow up as needed.        Sharin Grave, MD 07/25/12 1622

## 2012-07-25 NOTE — ED Notes (Signed)
States has been cleaning out her house over past week; on Friday afternoon went to go to fridge and suddenly couldn't bend over due to right low back pain.  Pain has continued, been constant, now starting to go down into right buttock and hip.  Denies lower extremity parasthesias, "but I had some numbness/tingling in both of my arms" (denies at present).  Denies any flank pain.  Pain worse when she initially gets up from sitting position or if trying to bend.  Has been taking Aleve and Bayer Advanced without any relief.

## 2012-09-15 ENCOUNTER — Emergency Department (HOSPITAL_COMMUNITY)
Admission: EM | Admit: 2012-09-15 | Discharge: 2012-09-15 | Disposition: A | Discharge status: Home / Self Care | Payer: BC Managed Care – PPO | Attending: Emergency Medicine | Admitting: Emergency Medicine

## 2012-09-15 ENCOUNTER — Encounter (HOSPITAL_COMMUNITY): Payer: Self-pay | Admitting: *Deleted

## 2012-09-15 DIAGNOSIS — R197 Diarrhea, unspecified: Secondary | ICD-10-CM | POA: Insufficient documentation

## 2012-09-15 DIAGNOSIS — Z8719 Personal history of other diseases of the digestive system: Secondary | ICD-10-CM | POA: Insufficient documentation

## 2012-09-15 DIAGNOSIS — R112 Nausea with vomiting, unspecified: Secondary | ICD-10-CM | POA: Insufficient documentation

## 2012-09-15 DIAGNOSIS — Z79899 Other long term (current) drug therapy: Secondary | ICD-10-CM | POA: Insufficient documentation

## 2012-09-15 DIAGNOSIS — Z3202 Encounter for pregnancy test, result negative: Secondary | ICD-10-CM | POA: Insufficient documentation

## 2012-09-15 DIAGNOSIS — G43909 Migraine, unspecified, not intractable, without status migrainosus: Secondary | ICD-10-CM | POA: Insufficient documentation

## 2012-09-15 LAB — CBC WITH DIFFERENTIAL/PLATELET
Basophils Absolute: 0 10*3/uL (ref 0.0–0.1)
Basophils Relative: 0 % (ref 0–1)
Eosinophils Absolute: 0 10*3/uL (ref 0.0–0.7)
Eosinophils Relative: 0 % (ref 0–5)
Lymphs Abs: 1.5 10*3/uL (ref 0.7–4.0)
MCH: 30.1 pg (ref 26.0–34.0)
MCV: 84.5 fL (ref 78.0–100.0)
Neutrophils Relative %: 79 % — ABNORMAL HIGH (ref 43–77)
Platelets: 309 10*3/uL (ref 150–400)
RBC: 4.98 MIL/uL (ref 3.87–5.11)
RDW: 12 % (ref 11.5–15.5)

## 2012-09-15 LAB — COMPREHENSIVE METABOLIC PANEL
ALT: 15 U/L (ref 0–35)
AST: 25 U/L (ref 0–37)
Albumin: 4.4 g/dL (ref 3.5–5.2)
Alkaline Phosphatase: 73 U/L (ref 39–117)
Calcium: 11.2 mg/dL — ABNORMAL HIGH (ref 8.4–10.5)
GFR calc Af Amer: >90 mL/min (ref >90)
Glucose, Bld: 102 mg/dL — ABNORMAL HIGH (ref 70–99)
Potassium: 3.9 mEq/L (ref 3.5–5.1)
Sodium: 137 mEq/L (ref 135–145)
Total Protein: 9 g/dL — ABNORMAL HIGH (ref 6.0–8.3)

## 2012-09-15 LAB — URINALYSIS, ROUTINE W REFLEX MICROSCOPIC
Bilirubin Urine: NEGATIVE
Nitrite: NEGATIVE
Protein, ur: NEGATIVE mg/dL
Specific Gravity, Urine: 1.027 (ref 1.005–1.030)
Urobilinogen, UA: 0.2 mg/dL (ref 0.0–1.0)

## 2012-09-15 MED ORDER — PANTOPRAZOLE SODIUM 40 MG IV SOLR
40.0000 mg | Freq: Once | INTRAVENOUS | Status: AC
  Administered 2012-09-15: 40 mg via INTRAVENOUS
  Filled 2012-09-15: qty 40

## 2012-09-15 MED ORDER — SODIUM CHLORIDE 0.9 % IV BOLUS (SEPSIS)
1000.0000 mL | Freq: Once | INTRAVENOUS | Status: AC
  Administered 2012-09-15: 1000 mL via INTRAVENOUS

## 2012-09-15 MED ORDER — ONDANSETRON HCL 4 MG/2ML IJ SOLN
4.0000 mg | Freq: Once | INTRAMUSCULAR | Status: AC
  Administered 2012-09-15: 4 mg via INTRAVENOUS
  Filled 2012-09-15: qty 2

## 2012-09-15 MED ORDER — ONDANSETRON 4 MG PO TBDP
4.0000 mg | ORAL_TABLET | Freq: Once | ORAL | Status: AC
  Administered 2012-09-15: 4 mg via ORAL
  Filled 2012-09-15: qty 1

## 2012-09-15 MED ORDER — ONDANSETRON HCL 8 MG PO TABS: 8.0000 mg | ORAL_TABLET | Freq: Three times a day (TID) | ORAL | Status: DC | PRN

## 2012-09-15 NOTE — ED Provider Notes (Signed)
History     CSN: [id number]  Arrival date & time 09/15/12  1325   First MD Initiated Contact with Patient 09/15/12 1451      Chief complaint:  Nvd.   (Consider location/radiation/quality/duration/timing/severity/associated sxs/prior treatment) The history is provided by the patient.  pt with nvd since this morning. Last ate last pm. No known bad food ingestion or ill contacts. No recent abx use, no travel.  Several episodes of each. Emesis, clear to yellowish, not bloody. Diarrhea watery, not bloody. Intermittent mid abd crampy pain, no focal or constant abd pain. No dysuria or gu c/o. No faintness. No fever or chills. No cough or uri c/o.   Past Medical History  Diagnosis Date  . Migraine   . Colitis   . GERD (gastroesophageal reflux disease)     Past Surgical History  Procedure Date  . Wisdom tooth extraction     No family history on file.  History  Substance Use Topics  . Smoking status: Never Smoker   . Smokeless tobacco: Not on file  . Alcohol Use: No    OB History    Grav Para Term Preterm Abortions TAB SAB Ect Mult Living                  Review of Systems  Constitutional: Negative for fever and chills.  HENT: Negative for neck pain.   Eyes: Negative for redness.  Respiratory: Negative for shortness of breath.   Cardiovascular: Negative for chest pain.  Gastrointestinal: Positive for vomiting and diarrhea.  Genitourinary: Negative for flank pain.  Musculoskeletal: Negative for back pain.  Skin: Negative for rash.  Neurological: Negative for headaches.  Hematological: Does not bruise/bleed easily.  Psychiatric/Behavioral: Negative for confusion.    Allergies  Cortisone and Flexeril  Home Medications   Current Outpatient Rx  Name  Route  Sig  Dispense  Refill  . ALPRAZOLAM 0.5 MG PO TABS   Oral   Take 0.5 mg by mouth at bedtime as needed. For sleep         . EPINEPHRINE 0.3 MG/0.3ML IJ DEVI   Intramuscular   Inject 0.3 mLs (0.3 mg total)  into the muscle once.   1 Device   2   . IBUPROFEN 600 MG PO TABS   Oral   Take 1 tablet (600 mg total) by mouth every 8 (eight) hours as needed for pain.   20 tablet   0   . NORELGESTROMIN-ETH ESTRADIOL 150-20 MCG/24HR TD PTWK   Transdermal   Place 1 patch onto the skin once a week.         Marland Kitchen RIZATRIPTAN BENZOATE PO   Oral   Take 1 tablet by mouth daily as needed. Prn migraines           BP 131/81  Pulse 121  Temp 97.9 F (36.6 C) (Oral)  Resp 22  SpO2 98%  LMP 08/26/2012  Physical Exam  Nursing note and vitals reviewed. Constitutional: She appears well-developed and well-nourished. No distress.  HENT:  Mouth/Throat: Oropharynx is clear and moist.  Eyes: Conjunctivae normal are normal. No scleral icterus.  Neck: Neck supple. No tracheal deviation present.  Cardiovascular: Regular rhythm, normal heart sounds and intact distal pulses.   Pulmonary/Chest: Effort normal and breath sounds normal. No respiratory distress.  Abdominal: Soft. Normal appearance and bowel sounds are normal. She exhibits no distension and no mass. There is no tenderness. There is no rebound and no guarding.  Genitourinary:  No cva tenderness  Musculoskeletal: She exhibits no edema.  Neurological: She is alert.  Skin: Skin is warm and dry. No rash noted.  Psychiatric: She has a normal mood and affect.    ED Course  Procedures (including critical care time)  Labs Reviewed  CBC WITH DIFFERENTIAL - Abnormal; Notable for the following:    Neutrophils Relative 79 (*)     All other components within normal limits  COMPREHENSIVE METABOLIC PANEL - Abnormal; Notable for the following:    Glucose, Bld 102 (*)     Calcium 11.2 (*)     Total Protein 9.0 (*)     All other components within normal limits  URINALYSIS, ROUTINE W REFLEX MICROSCOPIC   Results for orders placed during the hospital encounter of 09/15/12  URINALYSIS, ROUTINE W REFLEX MICROSCOPIC      Component Value Range    Color, Urine YELLOW  YELLOW   APPearance CLOUDY (*) CLEAR   Specific Gravity, Urine 1.027  1.005 - 1.030   pH 5.5  5.0 - 8.0   Glucose, UA NEGATIVE  NEGATIVE mg/dL   Hgb urine dipstick NEGATIVE  NEGATIVE   Bilirubin Urine NEGATIVE  NEGATIVE   Ketones, ur NEGATIVE  NEGATIVE mg/dL   Protein, ur NEGATIVE  NEGATIVE mg/dL   Urobilinogen, UA 0.2  0.0 - 1.0 mg/dL   Nitrite NEGATIVE  NEGATIVE   Leukocytes, UA NEGATIVE  NEGATIVE  CBC WITH DIFFERENTIAL      Component Value Range   WBC 8.8  4.0 - 10.5 K/uL   RBC 4.98  3.87 - 5.11 MIL/uL   Hemoglobin 15.0  12.0 - 15.0 g/dL   HCT 96.0  45.4 - 09.8 %   MCV 84.5  78.0 - 100.0 fL   MCH 30.1  26.0 - 34.0 pg   MCHC 35.6  30.0 - 36.0 g/dL   RDW 11.9  14.7 - 82.9 %   Platelets 309  150 - 400 K/uL   Neutrophils Relative 79 (*) 43 - 77 %   Neutro Abs 7.0  1.7 - 7.7 K/uL   Lymphocytes Relative 17  12 - 46 %   Lymphs Abs 1.5  0.7 - 4.0 K/uL   Monocytes Relative 3  3 - 12 %   Monocytes Absolute 0.3  0.1 - 1.0 K/uL   Eosinophils Relative 0  0 - 5 %   Eosinophils Absolute 0.0  0.0 - 0.7 K/uL   Basophils Relative 0  0 - 1 %   Basophils Absolute 0.0  0.0 - 0.1 K/uL  COMPREHENSIVE METABOLIC PANEL      Component Value Range   Sodium 137  135 - 145 mEq/L   Potassium 3.9  3.5 - 5.1 mEq/L   Chloride 98  96 - 112 mEq/L   CO2 24  19 - 32 mEq/L   Glucose, Bld 102 (*) 70 - 99 mg/dL   BUN 11  6 - 23 mg/dL   Creatinine, Ser 5.62  0.50 - 1.10 mg/dL   Calcium 13.0 (*) 8.4 - 10.5 mg/dL   Total Protein 9.0 (*) 6.0 - 8.3 g/dL   Albumin 4.4  3.5 - 5.2 g/dL   AST 25  0 - 37 U/L   ALT 15  0 - 35 U/L   Alkaline Phosphatase 73  39 - 117 U/L   Total Bilirubin 0.3  0.3 - 1.2 mg/dL   GFR calc non Af Amer >90  >90 mL/min   GFR calc Af Amer >90  >90 mL/min  POCT PREGNANCY, URINE      Component Value Range   Preg Test, Ur NEGATIVE  NEGATIVE       MDM  Iv ns bolus. zofran iv. protonix iv.  Additional ns bolus.  Labs sent from triage.  Reviewed nursing notes  and prior charts for additional history.    Recheck feels improved. abd soft nt.        Suzi Roots, MD 09/15/12 1740

## 2012-09-15 NOTE — ED Notes (Signed)
Pt knows that urine is needed. Pt is unable to void at this time.  

## 2012-09-15 NOTE — ED Notes (Signed)
Care transferred and report given to Lauren, RN 

## 2012-09-15 NOTE — ED Notes (Signed)
Pt has been vomiting all day since she woke up this am.  Pt reports diarrhea.  Pt has mid abdominal pain.  Pt thinks it is related to her colitis

## 2012-09-15 NOTE — ED Notes (Signed)
Patient claims has had off/on headache x 1 1/2 wks.   Patient claims this morning at 0300, she started vomiting and having diarrhea.  Patient claims has been throwing up a few times an hour with bouts of diarrhea also since.

## 2012-10-05 ENCOUNTER — Ambulatory Visit (INDEPENDENT_AMBULATORY_CARE_PROVIDER_SITE_OTHER): Payer: BC Managed Care – PPO | Admitting: Physician Assistant

## 2012-10-05 VITALS — BP 121/87 | HR 75 | Temp 98.0°F | Resp 16 | Ht 63.0 in | Wt 167.0 lb

## 2012-10-05 DIAGNOSIS — R0981 Nasal congestion: Secondary | ICD-10-CM

## 2012-10-05 DIAGNOSIS — R05 Cough: Secondary | ICD-10-CM

## 2012-10-05 DIAGNOSIS — R059 Cough, unspecified: Secondary | ICD-10-CM

## 2012-10-05 DIAGNOSIS — J029 Acute pharyngitis, unspecified: Secondary | ICD-10-CM

## 2012-10-05 DIAGNOSIS — J039 Acute tonsillitis, unspecified: Secondary | ICD-10-CM

## 2012-10-05 DIAGNOSIS — J3489 Other specified disorders of nose and nasal sinuses: Secondary | ICD-10-CM

## 2012-10-05 LAB — POCT RAPID STREP A (OFFICE): Rapid Strep A Screen: NEGATIVE

## 2012-10-05 LAB — POCT CBC
Granulocyte percent: 63.7 %G (ref 37–80)
HCT, POC: 38.3 % (ref 37.7–47.9)
Hemoglobin: 12.1 g/dL — AB (ref 12.2–16.2)
Lymph, poc: 2.2 (ref 0.6–3.4)
MCH, POC: 28.9 pg (ref 27–31.2)
MCHC: 31.6 g/dL — AB (ref 31.8–35.4)
MCV: 91.7 fL (ref 80–97)
MID (cbc): 0.4 (ref 0–0.9)
MPV: 9.4 fL (ref 0–99.8)
POC Granulocyte: 4.6 (ref 2–6.9)
POC LYMPH PERCENT: 30.8 %L (ref 10–50)
POC MID %: 5.5 %M (ref 0–12)
Platelet Count, POC: 225 10*3/uL (ref 142–424)
RBC: 4.18 M/uL (ref 4.04–5.48)
RDW, POC: 11.9 %
WBC: 7.2 10*3/uL (ref 4.6–10.2)

## 2012-10-05 MED ORDER — CEFDINIR 300 MG PO CAPS: 300.0000 mg | ORAL_CAPSULE | Freq: Two times a day (BID) | ORAL | Status: DC

## 2012-10-05 MED ORDER — HYDROCODONE-HOMATROPINE 5-1.5 MG/5ML PO SYRP: 5.0000 mL | ORAL_SOLUTION | Freq: Three times a day (TID) | ORAL | Status: DC | PRN

## 2012-10-05 NOTE — Progress Notes (Signed)
Subjective:    Patient ID: [name] [name], female    DOB: 04-26-1981, 32 y.o.   MRN: [id number]  HPI 32 year old female presents with 3 day history of sore throat, nasal congestion, postnasal drainage, and slight dry cough.  States symptoms have progressively worsened and her throat has become extremely painful causing her to have significant pain with swallowing.  Denies any fever, chills, nausea, vomiting, or abdominal pain.  Does have a history of strep infections when she was a child, but has not had any episodes since then.  She has taken ibuprofen which has helped her headache but not the throat pain.  She is otherwise healthy without any other concerns today.     Review of Systems  Constitutional: Negative for fever and chills.  HENT: Positive for congestion, sore throat, rhinorrhea and postnasal drip. Negative for neck pain and neck stiffness.   Respiratory: Positive for cough (slight, dry). Negative for chest tightness and wheezing.   Cardiovascular: Negative for chest pain.  Gastrointestinal: Negative for nausea, vomiting and abdominal pain.  Neurological: Positive for headaches (resolved). Negative for dizziness and light-headedness.  All other systems reviewed and are negative.       Objective:   Physical Exam  Constitutional: She is oriented to person, place, and time. She appears well-developed and well-nourished.  HENT:  Head: Normocephalic and atraumatic.  Right Ear: Hearing, tympanic membrane, external ear and ear canal normal.  Left Ear: Hearing, tympanic membrane, external ear and ear canal normal.  Mouth/Throat: Uvula is midline and mucous membranes are normal. Posterior oropharyngeal erythema present. No oropharyngeal exudate (no tonsillar swelling bilaterally), posterior oropharyngeal edema or tonsillar abscesses.  Eyes: Conjunctivae normal are normal.  Neck: Normal range of motion. Neck supple.  Cardiovascular: Normal rate, regular rhythm and normal heart  sounds.   Pulmonary/Chest: Effort normal and breath sounds normal.  Lymphadenopathy:    She has cervical adenopathy (AC).  Neurological: She is alert and oriented to person, place, and time.  Psychiatric: She has a normal mood and affect. Her behavior is normal. Judgment and thought content normal.      Results for orders placed in visit on 10/05/12  POCT CBC      Component Value Range   WBC 7.2  4.6 - 10.2 K/uL   Lymph, poc 2.2  0.6 - 3.4   POC LYMPH PERCENT 30.8  10 - 50 %L   MID (cbc) 0.4  0 - 0.9   POC MID % 5.5  0 - 12 %M   POC Granulocyte 4.6  2 - 6.9   Granulocyte percent 63.7  37 - 80 %G   RBC 4.18  4.04 - 5.48 M/uL   Hemoglobin 12.1 (*) 12.2 - 16.2 g/dL   HCT, POC 40.9  81.1 - 47.9 %   MCV 91.7  80 - 97 fL   MCH, POC 28.9  27 - 31.2 pg   MCHC 31.6 (*) 31.8 - 35.4 g/dL   RDW, POC 91.4     Platelet Count, POC 225  142 - 424 K/uL   MPV 9.4  0 - 99.8 fL  POCT RAPID STREP A (OFFICE)      Component Value Range   Rapid Strep A Screen Negative  Negative        Assessment & Plan:   1. Acute pharyngitis  POCT CBC, POCT rapid strep A, Culture, Group A Strep, HYDROcodone-homatropine (HYCODAN) 5-1.5 MG/5ML syrup  2. Tonsillitis  cefdinir (OMNICEF) 300 MG capsule  3. Nasal  congestion    4. Cough     Will go ahead and cover for strep with omnicef 300 mg bid Hycodan tid prn cough Increase fluids and rest Follow up if symptoms worsen or fail to improve.

## 2012-10-06 ENCOUNTER — Encounter: Payer: Self-pay | Admitting: Physician Assistant

## 2012-10-07 LAB — CULTURE, GROUP A STREP: Organism ID, Bacteria: NORMAL

## 2012-11-29 ENCOUNTER — Encounter: Payer: Self-pay | Admitting: *Deleted

## 2012-12-01 ENCOUNTER — Ambulatory Visit (INDEPENDENT_AMBULATORY_CARE_PROVIDER_SITE_OTHER): Payer: BC Managed Care – PPO | Admitting: Family Medicine

## 2012-12-01 ENCOUNTER — Encounter: Payer: Self-pay | Admitting: Family Medicine

## 2012-12-01 VITALS — BP 118/88 | Temp 98.4°F | Ht 62.0 in | Wt 172.0 lb

## 2012-12-01 DIAGNOSIS — R109 Unspecified abdominal pain: Secondary | ICD-10-CM

## 2012-12-01 MED ORDER — HYOSCYAMINE SULFATE ER 0.375 MG PO TB12: 0.3750 mg | ORAL_TABLET | Freq: Two times a day (BID) | ORAL | Status: DC | PRN

## 2012-12-01 MED ORDER — ALPRAZOLAM 0.5 MG PO TABS: 0.5000 mg | ORAL_TABLET | Freq: Every evening | ORAL | Status: DC | PRN

## 2012-12-01 NOTE — Patient Instructions (Signed)
Please get your labwork done. Please take her medicine as prescribed. We will work on setting up the referral. Call us if any problems or if you do not hear about the referral appointment within the next one or 2 weeks.

## 2012-12-01 NOTE — Progress Notes (Signed)
  Subjective:    Patient ID: [name] [name], female    DOB: 28-Jun-1981, 32 y.o.   MRN: [id number]  HPI This patient is been having some ongoing abdominal discomfort she describes it as an aching sensation it comes and goes this been going on for over 6 months now she describes a colicky at times other times with the aching she relates she has frequent loose stools but they occur about every 3-4 days she finds that it doesn't vary depending on what she eats nothing seems to help but nothing seems to make it works. She denies blood in her stool. She denies fever chills weight loss. She has tried eliminating things from her diet without much success. She has tried over-the-counter medicines without much success. It does not wake her up at night. No localization to the discomfort. Past medical history she has had some problems with this previous and she is trying to Levsin he didn't seem to help some. She does have a history of migraines also history of reflux and remote history of fibromyalgia she does not smoke or drink she does travel a lot with her job has a history of diabetes.   Review of Systems She does not smoke.pertinent positive and negatives are listed above     Objective:   Physical Exam HEENT is benign neck no masses lungs are clear no crackles respiratory rate normal heart is regular no murmurs abdomen soft no guarding rebound or tenderness there is some generalized soreness in her abdomen no masses are felt extremities no edema skin warm dry neurologic grossly normal       Assessment & Plan:  Abdominal  pain, other specified site - Plan: Hepatic function panel, CBC, Lipase, Basic metabolic panel, hyoscyamine (LEVBID) 0.375 MG 12 hr tablet, Ambulatory referral to Gastroenterology  I told the patient that the best thing for her to do is try Levbid one twice daily to see if that doesn't help her. Also recommend gastroenterology consultation they will help decide if there could be any  inflammatory bowel disease going on. Patient may end up needing to have scope test with biopsies. She is to call her followup if problems.

## 2012-12-02 LAB — BASIC METABOLIC PANEL
BUN: 13 mg/dL (ref 6–23)
Creat: 0.68 mg/dL (ref 0.50–1.10)

## 2012-12-02 LAB — HEPATIC FUNCTION PANEL
ALT: 13 U/L (ref 0–35)
AST: 16 U/L (ref 0–37)
Bilirubin, Direct: 0.1 mg/dL (ref 0.0–0.3)
Indirect Bilirubin: 0.3 mg/dL (ref 0.0–0.9)
Total Protein: 8.1 g/dL (ref 6.0–8.3)

## 2012-12-02 LAB — CBC
MCH: 29.4 pg (ref 26.0–34.0)
MCV: 84.8 fL (ref 78.0–100.0)
Platelets: 299 10*3/uL (ref 150–400)
RDW: 12.9 % (ref 11.5–15.5)
WBC: 6.1 10*3/uL (ref 4.0–10.5)

## 2012-12-02 LAB — LIPASE: Lipase: 28 U/L (ref 0–75)

## 2012-12-08 ENCOUNTER — Encounter: Payer: Self-pay | Admitting: Gastroenterology

## 2012-12-10 ENCOUNTER — Encounter: Payer: Self-pay | Admitting: Gastroenterology

## 2012-12-10 ENCOUNTER — Ambulatory Visit (INDEPENDENT_AMBULATORY_CARE_PROVIDER_SITE_OTHER): Payer: BC Managed Care – PPO | Admitting: Gastroenterology

## 2012-12-10 VITALS — BP 100/60 | HR 88 | Ht 62.0 in | Wt 175.1 lb

## 2012-12-10 DIAGNOSIS — K5900 Constipation, unspecified: Secondary | ICD-10-CM

## 2012-12-10 NOTE — Patient Instructions (Addendum)
Please start taking citrucel (orange flavored) powder fiber supplement.  This may cause some bloating at first but that usually goes away. Begin with a small spoonful and work your way up to a large, heaping spoonful daily over a week. Start one gas ex pill with every meal for your bloating. Stop the antispasm medicine for now. Call to report on your symptoms in 4 weeks. If no improvement in symptoms, then trial of miralax or linzess pill trial.

## 2012-12-10 NOTE — Progress Notes (Signed)
HPI: This is a   very pleasant 32 year old woman whom I am meeting for the first time today.  abdmoinal pains intermittently for 2 years, very rare at first. Getting much more common. She has constipation, no bm for 4-5 days, then loose when it finally comes.  She gets very uncomfortable at day 4-5. Will have cramping, then hs bm and feels better.  No overt blood in stool.  Weight up 15 pounds in past 1 year.  11/2012 cbc, cmet, lipase normal  Father had diverticulitis, severe  Review of systems: Pertinent positive and negative review of systems were noted in the above HPI section. Complete review of systems was performed and was otherwise normal.    Past Medical History  Diagnosis Date  . Migraine   . Colitis   . GERD (gastroesophageal reflux disease)   . Insomnia   . 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 Fibromyalgia     Past Surgical History  Procedure Laterality Date  . Wisdom tooth extraction      Current Outpatient Prescriptions  Medication Sig Dispense Refill  . ALPRAZolam (XANAX) 0.5 MG tablet Take 1 tablet (0.5 mg total) by mouth at bedtime as needed. For sleep  30 tablet  3  . EPINEPHrine (EPI-PEN) 0.3 mg/0.3 mL DEVI Inject 0.3 mLs (0.3 mg total) into the muscle once.  1 Device  2  . hyoscyamine (LEVBID) 0.375 MG 12 hr tablet Take 1 tablet (0.375 mg total) by mouth every 12 (twelve) hours as needed for cramping.  60 tablet  0  . ibuprofen (ADVIL,MOTRIN) 600 MG tablet Take 1 tablet (600 mg total) by mouth every 8 (eight) hours as needed for pain.  20 tablet  0  . norelgestromin-ethinyl estradiol (ORTHO EVRA) 150-20 MCG/24HR transdermal patch Place 1 patch onto the skin once a week.      Marland Kitchen RIZATRIPTAN BENZOATE PO Take 1 tablet by mouth daily as needed. Prn migraines      . ondansetron (ZOFRAN-ODT) 4 MG disintegrating tablet Take 8 mg by mouth 3 (three) times daily as needed.       No current facility-administered medications for this visit.     Allergies as of 12/10/2012 - Review Complete 12/10/2012  Allergen Reaction Noted  . Cefdinir Hives and Nausea And Vomiting 10/08/2012  . Cortisone  07/25/2012  . Flexeril (cyclobenzaprine hcl) Hives 08/11/2011  . Flexeril (cyclobenzaprine) Itching 12/01/2012  . Lodine (etodolac)  11/29/2012    Family History  Problem Relation Age of Onset  . COPD Father   . Diabetes Father   . Diabetes Sister   . Diabetes Other   . Leukemia Sister   . Heart disease Father   . Diverticulosis Father     History   Social History  . Marital Status: Single    Spouse Name: N/A    Number of Children: 1  . Years of Education: N/A   Occupational History  . TRAVEL    Social History Main Topics  . Smoking status: Former Smoker    Types: Cigarettes    Quit date: 12/02/2007  . Smokeless tobacco: Never Used  . Alcohol Use: No  . Drug Use: No  . Sexually Active: Not on file   Other Topics Concern  . Not on file   Social History Narrative  . No narrative on file       Physical Exam: BP 100/60  Pulse 88  Ht 5\' 2"  (1.575 m)  Wt 175 lb 2  oz (79.436 kg)  BMI 32.02 kg/m2  LMP 11/20/2012 Constitutional: generally well-appearing Psychiatric: alert and oriented x3 Eyes: extraocular movements intact Mouth: oral pharynx moist, no lesions Neck: supple no lymphadenopathy Cardiovascular: heart regular rate and rhythm Lungs: clear to auscultation bilaterally Abdomen: soft, nontender, nondistended, no obvious ascites, no peritoneal signs, normal bowel sounds Extremities: no lower extremity edema bilaterally Skin: no lesions on visible extremities    Assessment and plan: 32 y.o. female with  alternating constipation diarrhea, bloating  I suspect her alternating bowel habits are the cause of her discomfort, also because of her bloating. She is predominantly constipated. She is going to try one month daily fiber supplements. We'll call my office to report on her symptoms. If that has not  improved and probably a trial of linzess.  There is nothing on history or lab tests to suggest anything more ominous going on, no alarm symptoms and so I do not think she needs endoscopic evaluation at this point.

## 2013-01-31 ENCOUNTER — Encounter: Payer: Self-pay | Admitting: *Deleted

## 2013-02-01 ENCOUNTER — Encounter: Payer: Self-pay | Admitting: Obstetrics & Gynecology

## 2013-02-01 ENCOUNTER — Ambulatory Visit (INDEPENDENT_AMBULATORY_CARE_PROVIDER_SITE_OTHER): Payer: BC Managed Care – PPO | Admitting: Obstetrics & Gynecology

## 2013-02-01 VITALS — BP 100/60

## 2013-02-01 DIAGNOSIS — R87629 Unspecified abnormal cytological findings in specimens from vagina: Secondary | ICD-10-CM

## 2013-02-01 DIAGNOSIS — N946 Dysmenorrhea, unspecified: Secondary | ICD-10-CM

## 2013-02-04 ENCOUNTER — Encounter: Payer: Self-pay | Admitting: Nurse Practitioner

## 2013-02-04 ENCOUNTER — Ambulatory Visit (INDEPENDENT_AMBULATORY_CARE_PROVIDER_SITE_OTHER): Payer: BC Managed Care – PPO | Admitting: Nurse Practitioner

## 2013-02-04 VITALS — BP 114/70 | Temp 98.6°F | Wt 178.0 lb

## 2013-02-04 DIAGNOSIS — L989 Disorder of the skin and subcutaneous tissue, unspecified: Secondary | ICD-10-CM

## 2013-02-04 MED ORDER — AMOXICILLIN-POT CLAVULANATE 875-125 MG PO TABS: 1.0000 | ORAL_TABLET | Freq: Two times a day (BID) | ORAL | Status: DC

## 2013-02-07 ENCOUNTER — Encounter: Payer: Self-pay | Admitting: Nurse Practitioner

## 2013-02-07 NOTE — Progress Notes (Signed)
Subjective:  Presents complaints of swelling and tenderness at some previous scar tissue on her upper left back. States she was stuck with a lead pencil when she was younger. Has had scar tissue there for years. Some slight swelling and drainage in the area last week, is not sure why this happened. Area is much improved. No fever.  Objective:   BP 114/70  Temp(Src) 98.6 F (37 C)  Wt 178 lb (80.74 kg)  BMI 32.55 kg/m2  LMP 01/17/2013 NAD. Alert, oriented. A raised pink area with a slightly open Center noted on the left upper leg. Slightly shiny in appearance. No active drainage. Minimal tenderness to palpation. Does not appear to go past the dermis.  Assessment:Leg skin lesion, left  Plan: Meds ordered this encounter  Medications  . amoxicillin-clavulanate (AUGMENTIN) 875-125 MG per tablet    Sig: Take 1 tablet by mouth 2 (two) times daily.    Dispense:  14 tablet    Refill:  0    Order Specific Question:  Supervising Provider    Answer:  Merlyn Albert [[id number]]   this is an atypical lesion, recommend that if the area does not go back to its previous appearance as a scar, the patient schedule appointment to have it reevaluated at with possible biopsy.

## 2013-02-10 NOTE — Progress Notes (Signed)
Patient ID: [name] [name], female   DOB: Jun 22, 1981, 32 y.o.   MRN: [id number] Patient seen for follow up

## 2013-02-15 NOTE — Addendum Note (Signed)
Addended by: Criss Alvine on: 02/15/2013 04:59 PM   Modules accepted: Orders

## 2013-02-16 ENCOUNTER — Telehealth: Payer: Self-pay | Admitting: Obstetrics & Gynecology

## 2013-02-17 NOTE — Telephone Encounter (Signed)
See routing note.

## 2013-02-17 NOTE — Telephone Encounter (Signed)
Pt notified Pap WNL and to repeat in 1 year.

## 2013-02-21 ENCOUNTER — Encounter: Payer: Self-pay | Admitting: Family Medicine

## 2013-02-21 ENCOUNTER — Ambulatory Visit (INDEPENDENT_AMBULATORY_CARE_PROVIDER_SITE_OTHER): Payer: BC Managed Care – PPO | Admitting: Family Medicine

## 2013-02-21 VITALS — BP 116/80 | HR 70 | Wt 176.8 lb

## 2013-02-21 DIAGNOSIS — L910 Hypertrophic scar: Secondary | ICD-10-CM

## 2013-02-21 NOTE — Progress Notes (Signed)
  Subjective:    Patient ID: [name] [name], female    DOB: 09/04/1981, 32 y.o.   MRN: [id number]  HPI Here for leg lesion Feb 04, 2013, completed ABT, with no change to lesion inner left leg. Pt describes it as tender, no drainage or fever. The patient took Augmentin twice daily for 7 days for this area was seen by nurse practitioner approximately a week and a half ago. She states she's had this area ever since high school but it's become a little bit larger and more tender recently but has even bled some at the edge. No prior history of cancers  Review of Systems See above    Objective:   Physical Exam This area has some ear edema raised area to it looks like a keloid but does have some friable skin close to the edge.       Assessment & Plan:  Keloid versus possible early skin cancer-referral to dermatology for shave removal await results call if problems.

## 2013-05-06 ENCOUNTER — Inpatient Hospital Stay (HOSPITAL_COMMUNITY)
Admission: AD | Admit: 2013-05-06 | Discharge: 2013-05-06 | Disposition: A | Discharge status: Home / Self Care | Payer: BC Managed Care – PPO | Source: Ambulatory Visit | Attending: Obstetrics & Gynecology | Admitting: Obstetrics & Gynecology

## 2013-05-06 ENCOUNTER — Encounter (HOSPITAL_COMMUNITY): Payer: Self-pay | Admitting: *Deleted

## 2013-05-06 ENCOUNTER — Inpatient Hospital Stay (HOSPITAL_COMMUNITY): Payer: BC Managed Care – PPO

## 2013-05-06 DIAGNOSIS — N898 Other specified noninflammatory disorders of vagina: Secondary | ICD-10-CM

## 2013-05-06 DIAGNOSIS — N939 Abnormal uterine and vaginal bleeding, unspecified: Secondary | ICD-10-CM

## 2013-05-06 DIAGNOSIS — N938 Other specified abnormal uterine and vaginal bleeding: Secondary | ICD-10-CM | POA: Insufficient documentation

## 2013-05-06 DIAGNOSIS — N949 Unspecified condition associated with female genital organs and menstrual cycle: Secondary | ICD-10-CM | POA: Insufficient documentation

## 2013-05-06 LAB — URINALYSIS, ROUTINE W REFLEX MICROSCOPIC
Glucose, UA: NEGATIVE mg/dL
Ketones, ur: NEGATIVE mg/dL
Leukocytes, UA: NEGATIVE
Nitrite: NEGATIVE
Protein, ur: NEGATIVE mg/dL
Urobilinogen, UA: 0.2 mg/dL (ref 0.0–1.0)

## 2013-05-06 LAB — CBC
HCT: 36.7 % (ref 36.0–46.0)
MCHC: 34.1 g/dL (ref 30.0–36.0)
Platelets: 234 10*3/uL (ref 150–400)
RDW: 12.1 % (ref 11.5–15.5)

## 2013-05-06 LAB — POCT PREGNANCY, URINE: Preg Test, Ur: NEGATIVE

## 2013-05-06 LAB — WET PREP, GENITAL

## 2013-05-06 IMAGING — US US TRANSVAGINAL NON-OB
1 series · 14 of 25 positions shown · non-contrast
Comparison: [GP]

CLINICAL DATA: Right-sided pelvic pain and bleeding for 9 days.
LMP [DATE]



[Series 1: us pelvis complete · 14 of 68 slices shown]
[im 1/68]
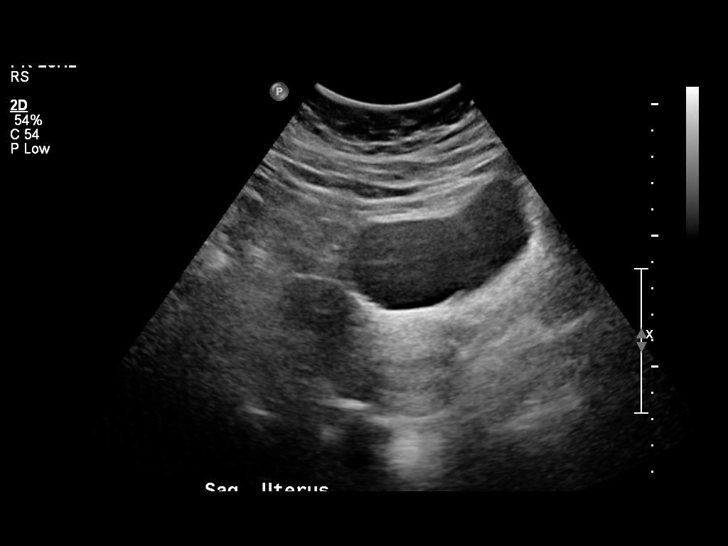
[im 6/68]
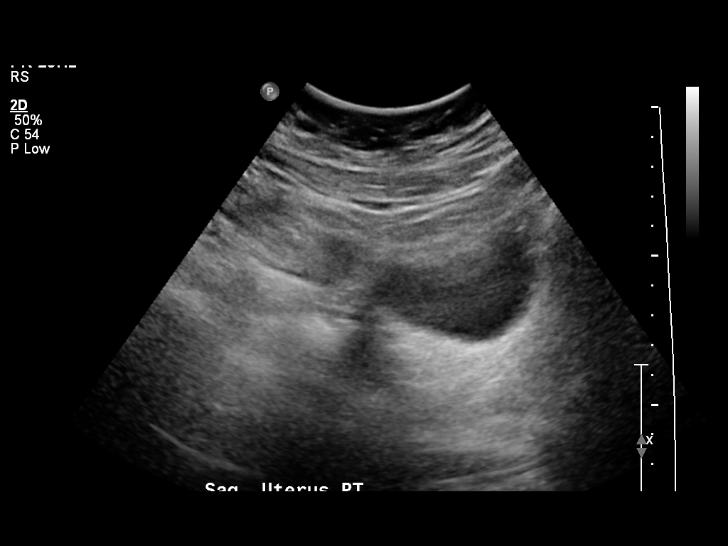
[im 12/68]
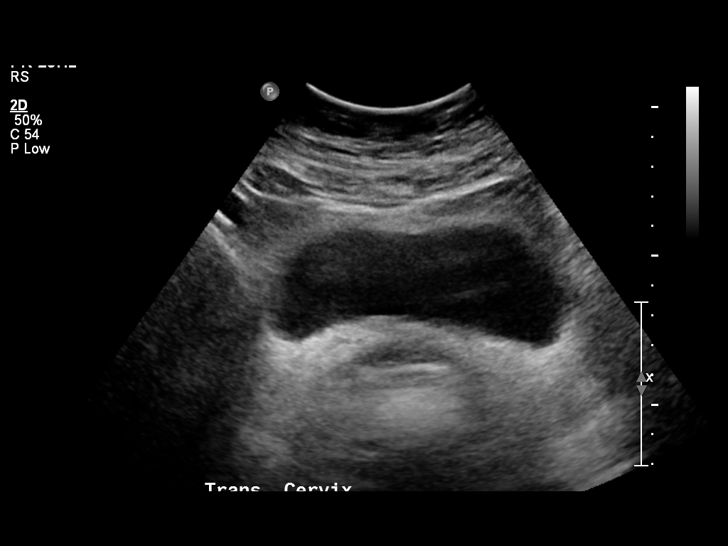
[im 17/68]
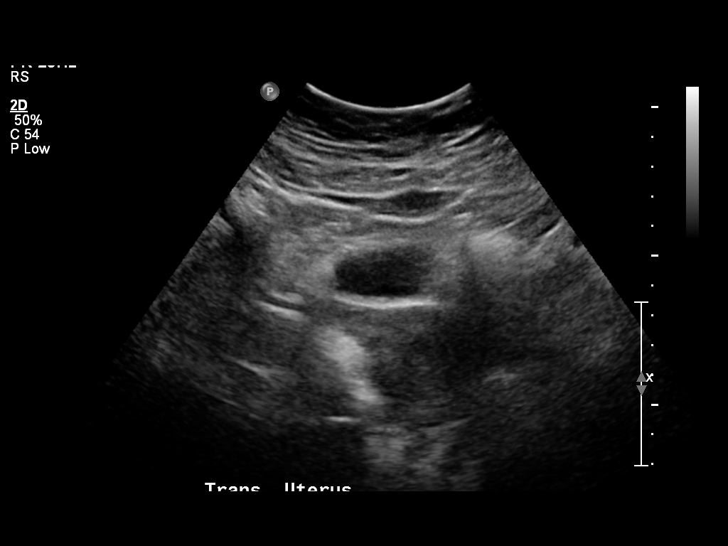
[im 23/68]
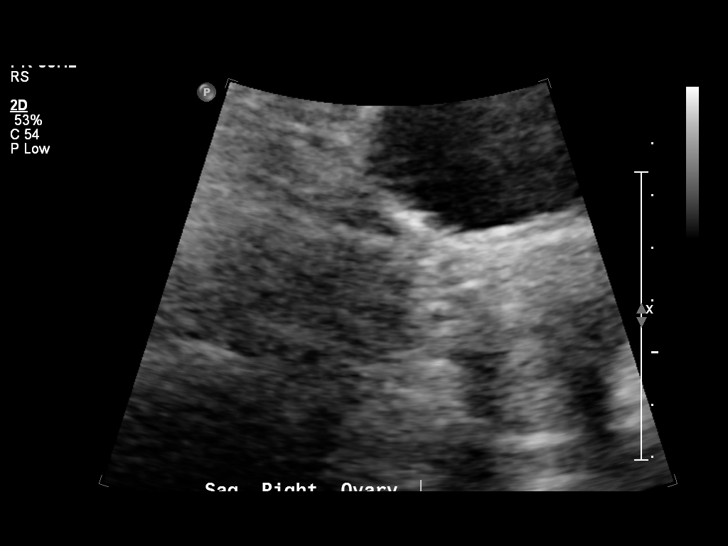
[im 26/68]
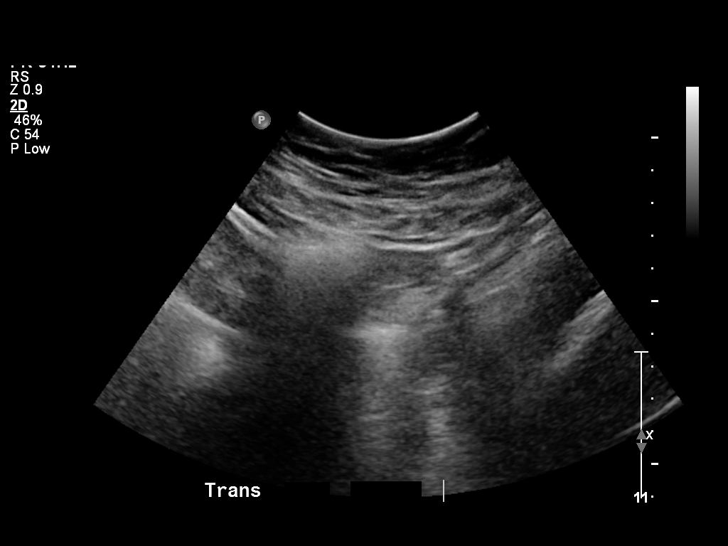
[im 31/68]
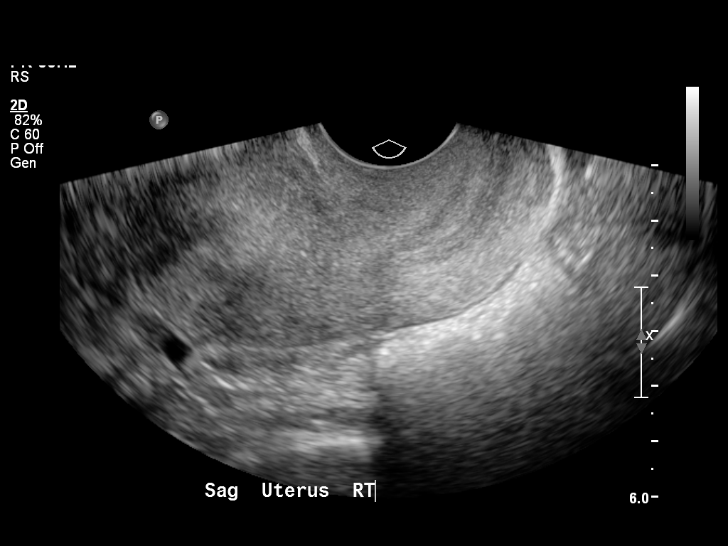
[im 37/68]
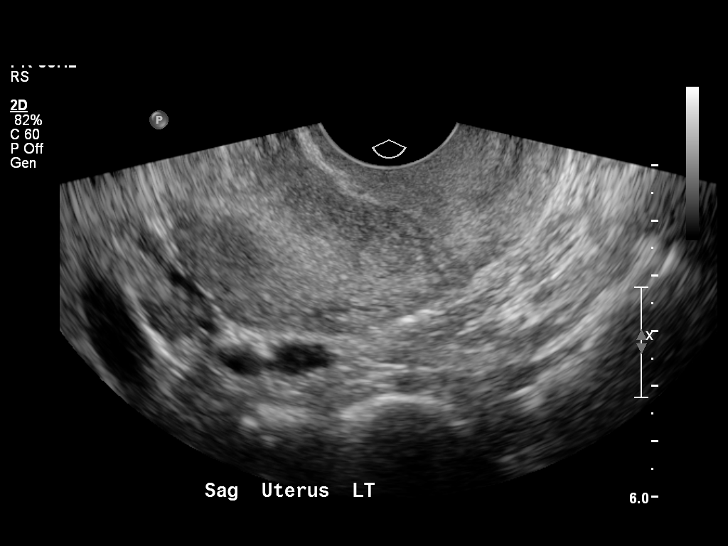
[im 42/68]
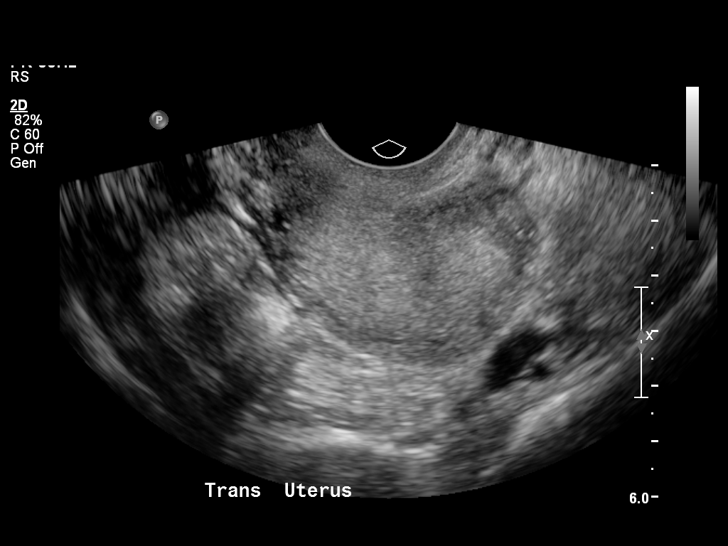
[im 45/68]
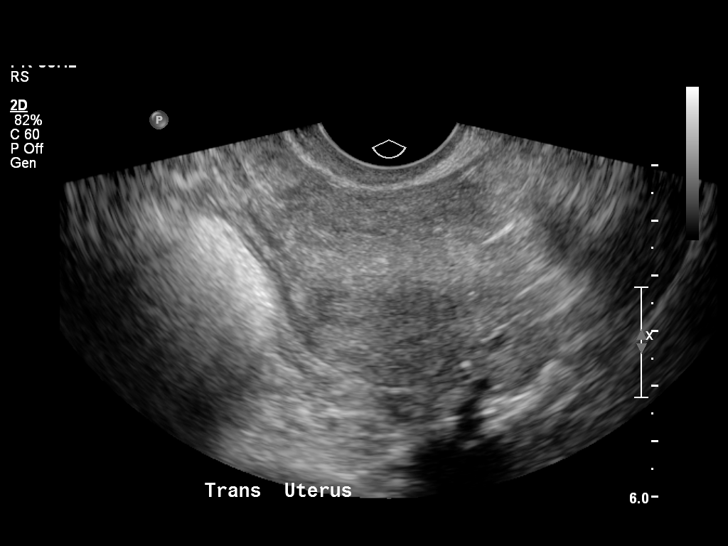
[im 51/68]
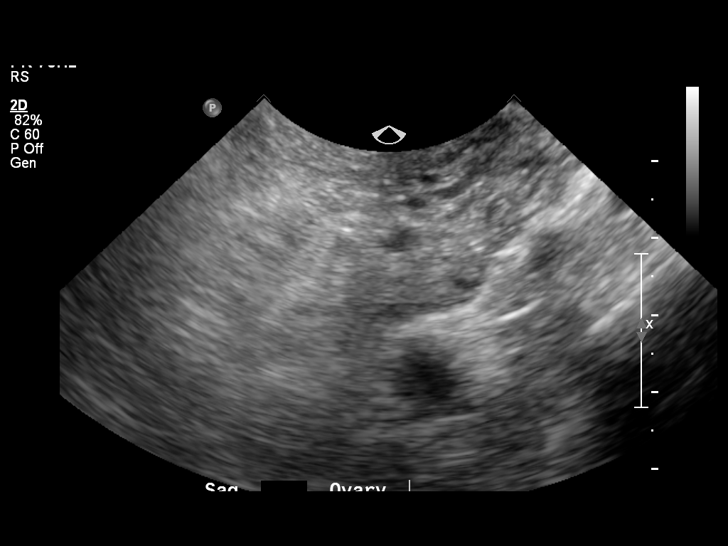
[im 56/68]
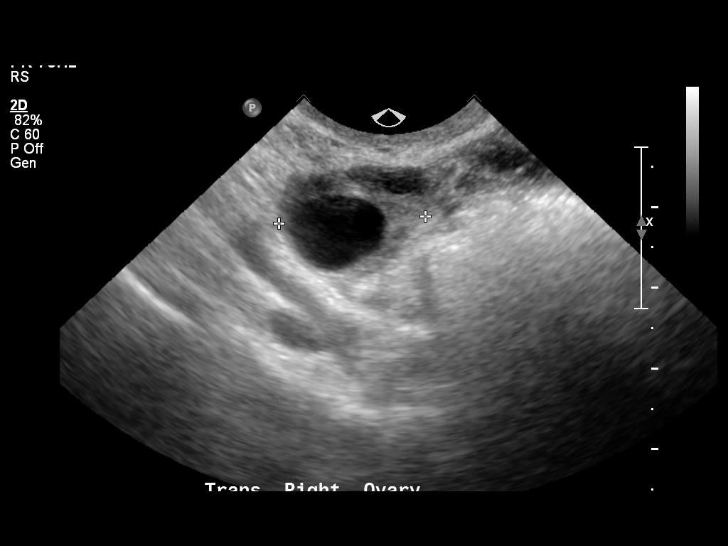
[im 62/68]
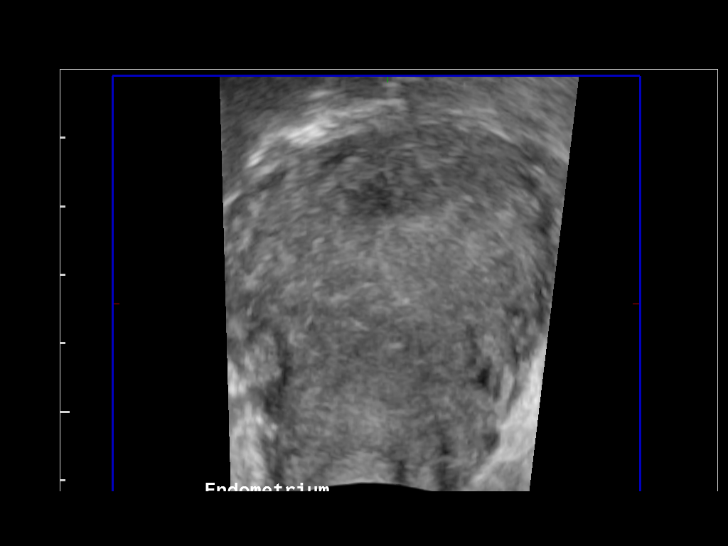
[im 68/68]
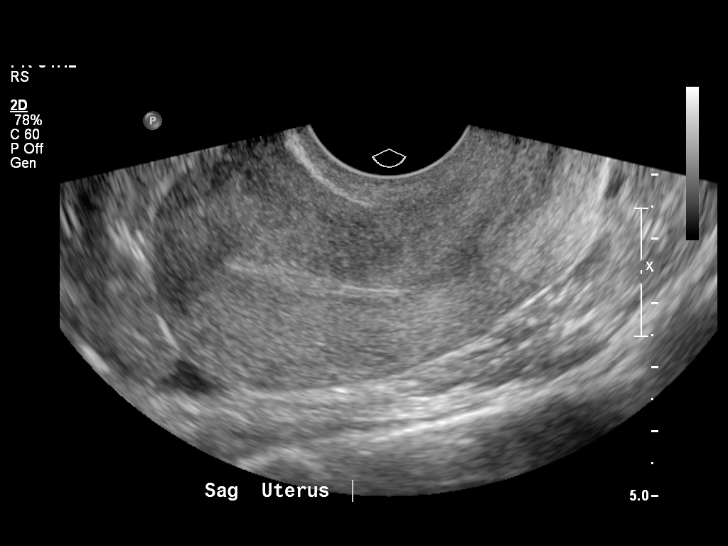

[14 of 25 positions shown; findings below may reference images not displayed]

FINDINGS: Uterus: Is anteverted and anteflexed and demonstrates a sagittal
length of 7.7 cm, depth of 3.7 cm and width of 4.9 cm.  No focal
mural abnormality is seen

Endometrium: appears thin and echogenic with a width of 3 mm.  No
areas of focal thickening or heterogeneity are seen

Right ovary:  Has a normal appearance measuring 2.9 x 1.4 x 1.8 cm
and contains a collapsing corpus luteum

Left ovary: Measures 1.6 x 1.2 x 1.1 cm and has a normal
appearance.

Other findings: No pelvic fluid or separate adnexal masses are
seen.
IMPRESSION: Normal pelvic ultrasound.

## 2013-05-06 MED ORDER — IBUPROFEN 600 MG PO TABS: 600.0000 mg | ORAL_TABLET | Freq: Four times a day (QID) | ORAL | Status: DC | PRN

## 2013-05-06 MED ORDER — KETOROLAC TROMETHAMINE 60 MG/2ML IM SOLN
60.0000 mg | Freq: Once | INTRAMUSCULAR | Status: AC
  Administered 2013-05-06: 60 mg via INTRAMUSCULAR
  Filled 2013-05-06: qty 2

## 2013-05-06 NOTE — MAU Note (Addendum)
Was on Ortho Evra patch. Was changed to [facility] in June due to ability to receive in mail as a 3 month supply. Has noticed a change in bleeding patterns since then. Was prescribed by Dr. Gerda Diss, her primary MD in [place]. Her OBGYN is Dr. Despina Hidden and plans to F/O with him, but cramping and bleeding are much worse. Has hx of multiple abnormal PAPS with colpo's. Was to be scheduled for LEEP just prior to pregnancy. Has not been rescheduled.

## 2013-05-06 NOTE — MAU Note (Signed)
Pt states LMP-04/08/2013-04/18/2013, then started bleeding again on 04/28/2013 to present, changing pad/tampon q2hours. Denies abnormal vaginal discharge prior to bleeding.

## 2013-05-06 NOTE — MAU Provider Note (Signed)
Attestation of Attending Supervision of Advanced Practitioner (PA/CNM/NP): Evaluation and management procedures were performed by the Advanced Practitioner under my supervision and collaboration.  I have reviewed the Advanced Practitioner's note and chart, and I agree with the management and plan.  Sareen Randon, MD, FACOG Attending Obstetrician & Gynecologist Faculty Practice, [facility]   

## 2013-05-06 NOTE — MAU Provider Note (Signed)
History     CSN: [id number]  Arrival date and time: 05/06/13 1020   First Provider Initiated Contact with Patient 05/06/13 1107      Chief Complaint  Patient presents with  . Vaginal Bleeding   HPI Pt is 32 yo G1P1 with a c/o abnormal vaginal bleeding.  She had a period on 7/25 - 8/4 and then on 8/14-present. She has always been regular and has not bled as much until this time.  She is changing her pad/tampon Q2 hrs which is saturated.  Of note, she was on ortho evra patch for years and at the beginning of June she started using a generic patch called Xulane she orders through the mail, and admits the patch has come off a few times without her knowing.  She also complains of a "pressure" on her lower right abdomen that started Wed this week and started getting worse yesterday.  She has mild nausea.  Denies fever, chills, vomiting, dysuria, urinary frequency or urgency, or bowel changes.  Denies new sexual partners.  OB History   Grav Para Term Preterm Abortions TAB SAB Ect Mult Living   1 1 1       1       Past Medical History  Diagnosis Date  . Migraine   . Colitis   . GERD (gastroesophageal reflux disease)   . Insomnia   . 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 Fibromyalgia   . Heart murmur   . Abnormal Pap smear     Multiple    Past Surgical History  Procedure Laterality Date  . Wisdom tooth extraction    . Colposcopy      multiple    Family History  Problem Relation Age of Onset  . COPD Father   . Diabetes Father   . Heart disease Father   . Diverticulosis Father   . Diabetes Sister   . Diabetes Other   . Leukemia Sister   . Cancer Other   . Thyroid disease Other     History  Substance Use Topics  . Smoking status: Former Smoker    Types: Cigarettes    Quit date: 12/02/2007  . Smokeless tobacco: Never Used  . Alcohol Use: No    Allergies:  Allergies  Allergen Reactions  . Cefdinir Hives and Nausea And Vomiting    Patient  reported allergic reaction on 10/08/12.  . Cortisone     Red face, hoarse voice following cortisone injection  . Flexeril [Cyclobenzaprine] Itching    Prescriptions prior to admission  Medication Sig Dispense Refill  . ALPRAZolam (XANAX) 0.5 MG tablet Take 0.5 mg by mouth at bedtime as needed for anxiety. For sleep      . [DISCONTINUED] ALPRAZolam (XANAX) 0.5 MG tablet Take 1 tablet (0.5 mg total) by mouth at bedtime as needed. For sleep  30 tablet  3  . [DISCONTINUED] ibuprofen (ADVIL,MOTRIN) 600 MG tablet Take 1 tablet (600 mg total) by mouth every 8 (eight) hours as needed for pain.  20 tablet  0  . EPINEPHrine (EPI-PEN) 0.3 mg/0.3 mL DEVI Inject 0.3 mLs (0.3 mg total) into the muscle once.  1 Device  2    ROS Physical Exam   Blood pressure 122/56, pulse 90, temperature 98 F (36.7 C), temperature source Oral, resp. rate 18, last menstrual period 04/28/2013.  Physical Exam  Constitutional: She is oriented to person, place, and time. She appears well-developed and well-nourished. No distress.  HENT:  Head: Normocephalic and atraumatic.  Cardiovascular: Normal rate.   Respiratory: Effort normal.  GI: Soft. Bowel sounds are normal. She exhibits no mass. There is tenderness in the right lower quadrant and suprapubic area. There is no rebound and no guarding.  Obese contour  Genitourinary: There is no rash, tenderness, lesion or injury on the right labia. There is no rash, tenderness, lesion or injury on the left labia. Uterus is not enlarged. Cervix exhibits no motion tenderness and no discharge. Right adnexum displays mass (palpable, tender, solid dime size mass) and tenderness. Left adnexum displays no mass and no tenderness. There is bleeding (small amount of blood in vaginal vaultf) around the vagina. No erythema or tenderness around the vagina. No foreign body around the vagina. No signs of injury around the vagina. No vaginal discharge found.  Neurological: She is alert and oriented  to person, place, and time.  Skin: Skin is warm and dry.  Psychiatric: She has a normal mood and affect. Her behavior is normal.   Results for orders placed during the hospital encounter of 05/06/13 (from the past 24 hour(s))  POCT PREGNANCY, URINE     Status: None   Collection Time    05/06/13 11:14 AM      Result Value Range   Preg Test, Ur NEGATIVE  NEGATIVE  WET PREP, GENITAL     Status: Abnormal   Collection Time    05/06/13 11:57 AM      Result Value Range   Yeast Wet Prep HPF POC NONE SEEN  NONE SEEN   Trich, Wet Prep NONE SEEN  NONE SEEN   Clue Cells Wet Prep HPF POC FEW (*) NONE SEEN   WBC, Wet Prep HPF POC FEW (*) NONE SEEN  URINALYSIS, ROUTINE W REFLEX MICROSCOPIC     Status: None   Collection Time    05/06/13 11:58 AM      Result Value Range   Color, Urine YELLOW  YELLOW   APPearance CLEAR  CLEAR   Specific Gravity, Urine 1.020  1.005 - 1.030   pH 6.0  5.0 - 8.0   Glucose, UA NEGATIVE  NEGATIVE mg/dL   Hgb urine dipstick NEGATIVE  NEGATIVE   Bilirubin Urine NEGATIVE  NEGATIVE   Ketones, ur NEGATIVE  NEGATIVE mg/dL   Protein, ur NEGATIVE  NEGATIVE mg/dL   Urobilinogen, UA 0.2  0.0 - 1.0 mg/dL   Nitrite NEGATIVE  NEGATIVE   Leukocytes, UA NEGATIVE  NEGATIVE  CBC     Status: None   Collection Time    05/06/13  1:38 PM      Result Value Range   WBC 6.7  4.0 - 10.5 K/uL   RBC 4.34  3.87 - 5.11 MIL/uL   Hemoglobin 12.5  12.0 - 15.0 g/dL   HCT 16.1  09.6 - 04.5 %   MCV 84.6  78.0 - 100.0 fL   MCH 28.8  26.0 - 34.0 pg   MCHC 34.1  30.0 - 36.0 g/dL   RDW 40.9  81.1 - 91.4 %   Platelets 234  150 - 400 K/uL   US Transvaginal Non-ob  05/06/2013   *RADIOLOGY REPORT*  Clinical Data: Right-sided pelvic pain and bleeding for 9 days. LMP 04/28/2013  TRANSABDOMINAL AND TRANSVAGINAL ULTRASOUND OF PELVIS Technique:  Both transabdominal and transvaginal ultrasound examinations of the pelvis were performed. Transabdominal technique was performed for global imaging of the pelvis  including uterus, ovaries, adnexal regions, and pelvic cul-de-sac.  It was necessary to proceed with  endovaginal exam following the transabdominal exam to visualize the myometrium, endometrium and adnexa.  Comparison:  2010  Findings:  Uterus: Is anteverted and anteflexed and demonstrates a sagittal length of 7.7 cm, depth of 3.7 cm and width of 4.9 cm.  No focal mural abnormality is seen  Endometrium: appears thin and echogenic with a width of 3 mm.  No areas of focal thickening or heterogeneity are seen  Right ovary:  Has a normal appearance measuring 2.9 x 1.4 x 1.8 cm and contains a collapsing corpus luteum  Left ovary: Measures 1.6 x 1.2 x 1.1 cm and has a normal appearance.  Other findings: No pelvic fluid or separate adnexal masses are seen.  IMPRESSION: Normal pelvic ultrasound.   Original Report Authenticated By: Rhodia Albright, M.D.   US Pelvis Complete  05/06/2013   *RADIOLOGY REPORT*  Clinical Data: Right-sided pelvic pain and bleeding for 9 days. LMP 04/28/2013  TRANSABDOMINAL AND TRANSVAGINAL ULTRASOUND OF PELVIS Technique:  Both transabdominal and transvaginal ultrasound examinations of the pelvis were performed. Transabdominal technique was performed for global imaging of the pelvis including uterus, ovaries, adnexal regions, and pelvic cul-de-sac.  It was necessary to proceed with endovaginal exam following the transabdominal exam to visualize the myometrium, endometrium and adnexa.  Comparison:  2010  Findings:  Uterus: Is anteverted and anteflexed and demonstrates a sagittal length of 7.7 cm, depth of 3.7 cm and width of 4.9 cm.  No focal mural abnormality is seen  Endometrium: appears thin and echogenic with a width of 3 mm.  No areas of focal thickening or heterogeneity are seen  Right ovary:  Has a normal appearance measuring 2.9 x 1.4 x 1.8 cm and contains a collapsing corpus luteum  Left ovary: Measures 1.6 x 1.2 x 1.1 cm and has a normal appearance.  Other findings: No pelvic fluid or  separate adnexal masses are seen.  IMPRESSION: Normal pelvic ultrasound.   Original Report Authenticated By: Rhodia Albright, M.D.   MAU Course  Procedures POCT urine preg UA Wet Prep GC/[place] Complete pelvic US CBC    Assessment and Plan  A:  Vaginal bleeding      Normal pelvic US  P: Toradol injection 60mg       Inform patient of free contraception at dept public health      Condoms as back up for any sexual intercourse      Bleeding precautions discussed.  Return to MAU if soaking more than 1 pad an hour.      Ibuprofen 600mg  every 6 hrs as needed for pain (#30) 1 RF      Keep your appointment with Dr. Despina Hidden on Monday, 05/09/13.       Garnette Czech ([place] Student) 8/22/20141:37 PM  Evaluation and management procedures were performed by the PA Student under my supervision and collaboration. I have reviewed the note and chart, and I agree with the management and plan.  [place], [place] 05/06/2013 1:50 PM  Iona Hansen Rasch, NP 05/06/2013 2:06 PM

## 2013-05-07 LAB — GC/CHLAMYDIA PROBE AMP
CT Probe RNA: NEGATIVE
GC Probe RNA: NEGATIVE

## 2013-05-09 ENCOUNTER — Ambulatory Visit: Payer: BC Managed Care – PPO | Admitting: Women's Health

## 2013-05-11 ENCOUNTER — Encounter: Payer: Self-pay | Admitting: Adult Health

## 2013-05-11 ENCOUNTER — Ambulatory Visit (INDEPENDENT_AMBULATORY_CARE_PROVIDER_SITE_OTHER): Payer: BC Managed Care – PPO | Admitting: Adult Health

## 2013-05-11 VITALS — BP 128/70 | Ht 62.0 in | Wt 182.0 lb

## 2013-05-11 DIAGNOSIS — A499 Bacterial infection, unspecified: Secondary | ICD-10-CM

## 2013-05-11 DIAGNOSIS — N939 Abnormal uterine and vaginal bleeding, unspecified: Secondary | ICD-10-CM | POA: Insufficient documentation

## 2013-05-11 DIAGNOSIS — B9689 Other specified bacterial agents as the cause of diseases classified elsewhere: Secondary | ICD-10-CM

## 2013-05-11 DIAGNOSIS — N926 Irregular menstruation, unspecified: Secondary | ICD-10-CM

## 2013-05-11 DIAGNOSIS — N760 Acute vaginitis: Secondary | ICD-10-CM

## 2013-05-11 LAB — POCT WET PREP (WET MOUNT)
Trichomonas Wet Prep HPF POC: NEGATIVE
WBC, Wet Prep HPF POC: POSITIVE

## 2013-05-11 MED ORDER — METRONIDAZOLE 500 MG PO TABS: 500.0000 mg | ORAL_TABLET | Freq: Two times a day (BID) | ORAL | Status: DC

## 2013-05-11 NOTE — Patient Instructions (Addendum)
Laparoscopic Tubal Ligation Laparoscopic tubal ligation is a procedure that closes the fallopian tubes at a time other than right after childbirth. By closing the fallopian tubes, the eggs that are released from the ovaries cannot enter the uterus and sperm cannot reach the egg. Tubal ligation is also known as getting your "tubes tied." Tubal ligation is done so you will not be able to get pregnant or have a baby.  Although this procedure may be reversed, it should be considered permanent and irreversible. If you want to have future pregnancies, you should not have this procedure.  LET YOUR CAREGIVER KNOW ABOUT:  Allergies to food or medicine.  Medicines taken, including vitamins, herbs, eyedrops, over-the-counter medicines, and creams.  Use of steroids (by mouth or creams).  Previous problems with numbing medicines.  History of bleeding problems or blood clots.  Any recent colds or infections.  Previous surgery.  Other health problems, including diabetes and kidney problems.  Possibility of pregnancy, if this applies.  Any past pregnancies. RISKS AND COMPLICATIONS   Infection.  Bleeding.  Injury to surrounding organs.  Anesthetic side effects.  Failure of the procedure.  Ectopic pregnancy.  Future regret about having the procedure done. BEFORE THE PROCEDURE  Do not take aspirin or blood thinners a week before the procedure or as directed. This can cause bleeding.  Do not eat or drink anything 6 to 8 hours before the procedure. PROCEDURE   You may be given a medicine to help you relax (sedative) before the procedure. You will be given a medicine to make you sleep (general anesthetic) during the procedure.  A tube will be put down your throat to help your breath while under general anesthesia.  Two small cuts (incisions) are made in the lower abdominal area and near the belly button.  Your abdominal area will be inflated with a safe gas (carbon dioxide). This helps  give the surgeon room to operate, visualize, and helps the surgeon avoid other organs.  A thin, lighted tube (laparoscope) with a camera attached is inserted into your abdomen through one of the incisions near the belly button. Other small instruments are also inserted through the other abdominal incision.  The fallopian tubes are located and are either blocked with a ring, clip, or are burned (cauterized).  After the fallopian tubes are blocked, the gas is released from the abdomen.  The incisions will be closed with stitches (sutures), and a bandage may be placed over the incisions. AFTER THE PROCEDURE   You will rest in a recovery room for 1 4 hours until you are stable and doing well.  You will also have some mild abdominal discomfort for 3 7 days. You will be given pain medicine to ease any discomfort.  As long as there are no problems, you may be allowed to go home. Someone will need to drive you home and be with you for at least 24 hours once home.  You may have some mild discomfort in the throat. This is from the tube placed in your throat while you were sleeping.  You may experience discomfort in the shoulder area from some trapped air between the liver and diaphragm. This sensation is normal and will slowly go away on its own. Document Released: 12/08/2000 Document Revised: 03/02/2012 Document Reviewed: 12/13/2011 [facility] Patient Information 2014 [place], [place]. Dysfunctional Uterine Bleeding Normally, menstrual periods begin between ages 76 to 42 in young women. A normal menstrual cycle/period may begin every 23 days up to 35 days and  lasts from 1 to 7 days. Around 12 to 14 days before your menstrual period starts, ovulation (ovary produces an egg) occurs. When counting the time between menstrual periods, count from the first day of bleeding of the previous period to the first day of bleeding of the next period. Dysfunctional (abnormal) uterine bleeding is bleeding that is  different from a normal menstrual period. Your periods may come earlier or later than usual. They may be lighter, have blood clots or be heavier. You may have bleeding between periods, or you may skip one period or more. You may have bleeding after sexual intercourse, bleeding after menopause, or no menstrual period. CAUSES   Pregnancy (normal, miscarriage, tubal).  IUDs (intrauterine device, birth control).  Birth control pills.  Hormone treatment.  Menopause.  Infection of the cervix.  Blood clotting problems.  Infection of the inside lining of the uterus.  Endometriosis, inside lining of the uterus growing in the pelvis and other female organs.  Adhesions (scar tissue) inside the uterus.  Obesity or severe weight loss.  Uterine polyps inside the uterus.  Cancer of the vagina, cervix, or uterus.  Ovarian cysts or polycystic ovary syndrome.  Medical problems (diabetes, thyroid disease).  Uterine fibroids (noncancerous tumor).  Problems with your female hormones.  Endometrial hyperplasia, very thick lining and enlarged cells inside of the uterus.  Medicines that interfere with ovulation.  Radiation to the pelvis or abdomen.  Chemotherapy. DIAGNOSIS   Your doctor will discuss the history of your menstrual periods, medicines you are taking, changes in your weight, stress in your life, and any medical problems you may have.  Your doctor will do a physical and pelvic examination.  Your doctor may want to perform certain tests to make a diagnosis, such as:  Pap test.  Blood tests.  Cultures for infection.  CT scan.  Ultrasound.  Hysteroscopy.  Laparoscopy.  MRI.  Hysterosalpingography.  D and C.  Endometrial biopsy. TREATMENT  Treatment will depend on the cause of the dysfunctional uterine bleeding (DUB). Treatment may include:  Observing your menstrual periods for a couple of months.  Prescribing medicines for medical problems,  including:  Antibiotics.  Hormones.  Birth control pills.  Removing an IUD (intrauterine device, birth control).  Surgery:  D and C (scrape and remove tissue from inside the uterus).  Laparoscopy (examine inside the abdomen with a lighted tube).  Uterine ablation (destroy lining of the uterus with electrical current, laser, heat, or freezing).  Hysteroscopy (examine cervix and uterus with a lighted tube).  Hysterectomy (remove the uterus). HOME CARE INSTRUCTIONS   If medicines were prescribed, take exactly as directed. Do not change or switch medicines without consulting your caregiver.  Long term heavy bleeding may result in iron deficiency. Your caregiver may have prescribed iron pills. They help replace the iron that your body lost from heavy bleeding. Take exactly as directed.  Do not take aspirin or medicines that contain aspirin one week before or during your menstrual period. Aspirin may make the bleeding worse.  If you need to change your sanitary pad or tampon more than once every 2 hours, stay in bed with your feet elevated and a cold pack on your lower abdomen. Rest as much as possible, until the bleeding stops or slows down.  Eat well-balanced meals. Eat foods high in iron. Examples are:  Leafy green vegetables.  Whole-grain breads and cereals.  Eggs.  Meat.  Liver.  Do not try to lose weight until the abnormal bleeding has  stopped and your blood iron level is back to normal. Do not lift more than ten pounds or do strenuous activities when you are bleeding.  For a couple of months, make note on your calendar, marking the start and ending of your period, and the type of bleeding (light, medium, heavy, spotting, clots or missed periods). This is for your caregiver to better evaluate your problem. SEEK MEDICAL CARE IF:   You develop nausea (feeling sick to your stomach) and vomiting, dizziness, or diarrhea while you are taking your medicine.  You are getting  lightheaded or weak.  You have any problems that may be related to the medicine you are taking.  You develop pain with your DUB.  You want to remove your IUD.  You want to stop or change your birth control pills or hormones.  You have any type of abnormal bleeding mentioned above.  You are over 32 years old and have not had a menstrual period yet.  You are 32 years old and you are still having menstrual periods.  You have any of the symptoms mentioned above.  You develop a rash. SEEK IMMEDIATE MEDICAL CARE IF:   An oral temperature above 102 F (38.9 C) develops.  You develop chills.  You are changing your sanitary pad or tampon more than once an hour.  You develop abdominal pain.  You pass out or faint. Document Released: 08/29/2000 Document Revised: 11/24/2011 Document Reviewed: 07/31/2009 [facility] Patient Information 2014 [place], [place]. Bacterial Vaginosis Bacterial vaginosis (BV) is a vaginal infection where the normal balance of bacteria in the vagina is disrupted. The normal balance is then replaced by an overgrowth of certain bacteria. There are several different kinds of bacteria that can cause BV. BV is the most common vaginal infection in women of childbearing age. CAUSES   The cause of BV is not fully understood. BV develops when there is an increase or imbalance of harmful bacteria.  Some activities or behaviors can upset the normal balance of bacteria in the vagina and put women at increased risk including:  Having a new sex partner or multiple sex partners.  Douching.  Using an intrauterine device (IUD) for contraception.  It is not clear what role sexual activity plays in the development of BV. However, women that have never had sexual intercourse are rarely infected with BV. Women do not get BV from toilet seats, bedding, swimming pools or from touching objects around them.  SYMPTOMS   Grey vaginal discharge.  A fish-like odor with discharge,  especially after sexual intercourse.  Itching or burning of the vagina and vulva.  Burning or pain with urination.  Some women have no signs or symptoms at all. DIAGNOSIS  Your caregiver must examine the vagina for signs of BV. Your caregiver will perform lab tests and look at the sample of vaginal fluid through a microscope. They will look for bacteria and abnormal cells (clue cells), a pH test higher than 4.5, and a positive amine test all associated with BV.  RISKS AND COMPLICATIONS   Pelvic inflammatory disease (PID).  Infections following gynecology surgery.  Developing HIV.  Developing herpes virus. TREATMENT  Sometimes BV will clear up without treatment. However, all women with symptoms of BV should be treated to avoid complications, especially if gynecology surgery is planned. Female partners generally do not need to be treated. However, BV may spread between female sex partners so treatment is helpful in preventing a recurrence of BV.   BV may be treated with  antibiotics. The antibiotics come in either pill or vaginal cream forms. Either can be used with nonpregnant or pregnant women, but the recommended dosages differ. These antibiotics are not harmful to the baby.  BV can recur after treatment. If this happens, a second round of antibiotics will often be prescribed.  Treatment is important for pregnant women. If not treated, BV can cause a premature delivery, especially for a pregnant woman who had a premature birth in the past. All pregnant women who have symptoms of BV should be checked and treated.  For chronic reoccurrence of BV, treatment with a type of prescribed gel vaginally twice a week is helpful. HOME CARE INSTRUCTIONS   Finish all medication as directed by your caregiver.  Do not have sex until treatment is completed.  Tell your sexual partner that you have a vaginal infection. They should see their caregiver and be treated if they have problems, such as a mild  rash or itching.  Practice safe sex. Use condoms. Only have 1 sex partner. PREVENTION  Basic prevention steps can help reduce the risk of upsetting the natural balance of bacteria in the vagina and developing BV:  Do not have sexual intercourse (be abstinent).  Do not douche.  Use all of the medicine prescribed for treatment of BV, even if the signs and symptoms go away.  Tell your sex partner if you have BV. That way, they can be treated, if needed, to prevent reoccurrence. SEEK MEDICAL CARE IF:   Your symptoms are not improving after 3 days of treatment.  You have increased discharge, pain, or fever. MAKE SURE YOU:   Understand these instructions.  Will watch your condition.  Will get help right away if you are not doing well or get worse. FOR MORE INFORMATION  Division of STD Prevention (DSTDP), Centers for Disease Control and Prevention: [employer] American Social Health Association (ASHA): www.ashastd.org  Document Released: 09/01/2005 Document Revised: 11/24/2011 Document Reviewed: 02/22/2009 [facility] Patient Information 2014 [place], [place]. Take flagyl no sex no alcohol Follow up prn

## 2013-05-11 NOTE — Progress Notes (Addendum)
Subjective:     Patient ID: [name] [name], female   DOB: [birth year], 32 y.o.   MRN: [id number]  HPI [name] [name] is a 32 year old white female in complaining of having bleeding x 2 weeks on patch and pelvic discomfort.She was seen at [facility] on 05/06/13 and had normal pelvic US and CBC, GC/CHL was negative and wet prep had +WBC and a few clue cells, she was not treated. She had PID in 2010. May want tubes tied in near future. Her patch was [employer] and it is now [place]  Review of Systems Positives in HPI Reviewed past medical,surgical, social and family history. Reviewed medications and allergies.     Objective:   Physical Exam BP 128/70  Ht 5\' 2"  (1.575 m)  Wt 182 lb (82.555 kg)  BMI 33.28 kg/m2  LMP 04/28/2013   Skin warm and dry.Pelvic: external genitalia is normal in appearance, vagina: tan discharge with odor,no bleeding now, cervix:smooth and bulbous,negative CMT, uterus: normal size, shape and contour,mildly tender, no masses felt, adnexa: no masses or tenderness noted. Wet prep: + for clue cells and +WBCs. Discussed could go back to orth evra patch if desired.  Assessment:     AUB BV    Plan:     Will continue patch for now   Rx flagyl 500 mg 1 bid x 7 days no sex or alcohol Review handouts on BV,AUB and tubal ligation Check insurance and call back if desires tubal or has any further bleeding

## 2013-06-01 ENCOUNTER — Encounter: Payer: Self-pay | Admitting: Obstetrics & Gynecology

## 2013-06-01 ENCOUNTER — Ambulatory Visit (INDEPENDENT_AMBULATORY_CARE_PROVIDER_SITE_OTHER): Payer: BC Managed Care – PPO | Admitting: Obstetrics & Gynecology

## 2013-06-01 VITALS — BP 108/64 | Ht 62.0 in | Wt 181.0 lb

## 2013-06-01 DIAGNOSIS — Z302 Encounter for sterilization: Secondary | ICD-10-CM

## 2013-06-01 DIAGNOSIS — Z3202 Encounter for pregnancy test, result negative: Secondary | ICD-10-CM

## 2013-06-01 DIAGNOSIS — Z3009 Encounter for other general counseling and advice on contraception: Secondary | ICD-10-CM

## 2013-06-01 LAB — POCT URINE PREGNANCY: Preg Test, Ur: NEGATIVE

## 2013-06-01 NOTE — Progress Notes (Signed)
Patient ID: [name] [name], female   DOB: 28-Nov-1980, 32 y.o.   MRN: [id number] Preoperative History and Physical  [name] [name] is a 32 y.o. G1P1001 with Patient's last menstrual period was 04/28/2013. admitted for a elective sterilization via laparoscopic bilateral tubal ligation.    PMH:    Past Medical History  Diagnosis Date  . Migraine   . Colitis   . GERD (gastroesophageal reflux disease)   . Insomnia   . 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 Fibromyalgia   . Heart murmur   . Abnormal Pap smear     Multiple  . Abnormal uterine bleeding (AUB) 05/11/2013  . BV (bacterial vaginosis) 05/11/2013    PSH:     Past Surgical History  Procedure Laterality Date  . Wisdom tooth extraction    . Colposcopy      multiple    POb/GynH:      OB History   Grav Para Term Preterm Abortions TAB SAB Ect Mult Living   1 1 1       1       SH:   History  Substance Use Topics  . Smoking status: Former Smoker    Types: Cigarettes    Quit date: 12/02/2007  . Smokeless tobacco: Never Used  . Alcohol Use: No    FH:    Family History  Problem Relation Age of Onset  . COPD Father   . Diabetes Father   . Heart disease Father   . Diverticulosis Father   . Diabetes Sister   . Leukemia Sister   . Diabetes Other   . Cancer Other   . Thyroid disease Other      Allergies:  Allergies  Allergen Reactions  . Cefdinir Hives and Nausea And Vomiting    Patient reported allergic reaction on 10/08/12.  . Cortisone     Red face, hoarse voice following cortisone injection  . Flexeril [Cyclobenzaprine] Itching    Medications:      Current outpatient prescriptions:ALPRAZolam (XANAX) 0.5 MG tablet, Take 0.5 mg by mouth at bedtime as needed for anxiety. For sleep, Disp: , Rfl: ;  EPINEPHrine (EPI-PEN) 0.3 mg/0.3 mL DEVI, Inject 0.3 mLs (0.3 mg total) into the muscle once., Disp: 1 Device, Rfl: 2;  ibuprofen (ADVIL,MOTRIN) 600 MG tablet, Take 1 tablet (600 mg total)  by mouth every 6 (six) hours as needed for pain., Disp: 30 tablet, Rfl: 1 metroNIDAZOLE (FLAGYL) 500 MG tablet, Take 1 tablet (500 mg total) by mouth 2 (two) times daily., Disp: 14 tablet, Rfl: 0;  XULANE 150-35 MCG/24HR transdermal patch, Place 1 patch onto the skin once a week. , Disp: , Rfl:   Review of Systems:   Review of Systems  Constitutional: Negative for fever, chills, weight loss, malaise/fatigue and diaphoresis.  HENT: Negative for hearing loss, ear pain, nosebleeds, congestion, sore throat, neck pain, tinnitus and ear discharge.   Eyes: Negative for blurred vision, double vision, photophobia, pain, discharge and redness.  Respiratory: Negative for cough, hemoptysis, sputum production, shortness of breath, wheezing and stridor.   Cardiovascular: Negative for chest pain, palpitations, orthopnea, claudication, leg swelling and PND.  Gastrointestinal: Negative for abdominal pain. Negative for heartburn, nausea, vomiting, diarrhea, constipation, blood in stool and melena.  Genitourinary: Negative for dysuria, urgency, frequency, hematuria and flank pain.  Musculoskeletal: Negative for myalgias, back pain, joint pain and falls.  Skin: Negative for itching and rash.  Neurological: Negative for dizziness, tingling, tremors, sensory change, speech  change, focal weakness, seizures, loss of consciousness, weakness and headaches.  Endo/Heme/Allergies: Negative for environmental allergies and polydipsia. Does not bruise/bleed easily.  Psychiatric/Behavioral: Negative for depression, suicidal ideas, hallucinations, memory loss and substance abuse. The patient is not nervous/anxious and does not have insomnia.      PHYSICAL EXAM:  Blood pressure 108/64, height 5\' 2"  (1.575 m), weight 181 lb (82.101 kg), last menstrual period 04/28/2013.    Vitals reviewed. Constitutional: She is oriented to person, place, and time. She appears well-developed and well-nourished.  HENT:  Head: Normocephalic  and atraumatic.  Right Ear: External ear normal.  Left Ear: External ear normal.  Nose: Nose normal.  Mouth/Throat: Oropharynx is clear and moist.  Eyes: Conjunctivae and EOM are normal. Pupils are equal, round, and reactive to light. Right eye exhibits no discharge. Left eye exhibits no discharge. No scleral icterus.  Neck: Normal range of motion. Neck supple. No tracheal deviation present. No thyromegaly present.  Cardiovascular: Normal rate, regular rhythm, normal heart sounds and intact distal pulses.  Exam reveals no gallop and no friction rub.   No murmur heard. Respiratory: Effort normal and breath sounds normal. No respiratory distress. She has no wheezes. She has no rales. She exhibits no tenderness.  GI: Soft. Bowel sounds are normal. She exhibits no distension and no mass. There is tenderness. There is no rebound and no guarding.  Genitourinary:       Vulva is normal without lesions Vagina is pink moist without discharge Cervix normal in appearance and pap is normal Uterus is normal by sonogram Adnexa is negative  By sonogram Musculoskeletal: Normal range of motion. She exhibits no edema and no tenderness.  Neurological: She is alert and oriented to person, place, and time. She has normal reflexes. She displays normal reflexes. No cranial nerve deficit. She exhibits normal muscle tone. Coordination normal.  Skin: Skin is warm and dry. No rash noted. No erythema. No pallor.  Psychiatric: She has a normal mood and affect. Her behavior is normal. Judgment and thought content normal.    Labs: Results for orders placed in visit on 06/01/13 (from the past 336 hour(s))  POCT URINE PREGNANCY   Collection Time    06/01/13  3:43 PM      Result Value Range   Preg Test, Ur Negative      EKG: No orders found for this or any previous visit.  Imaging Studies: US Transvaginal Non-ob  May 19, 2013   *RADIOLOGY REPORT*  Clinical Data: Right-sided pelvic pain and bleeding for 9 days. LMP  04/28/2013  TRANSABDOMINAL AND TRANSVAGINAL ULTRASOUND OF PELVIS Technique:  Both transabdominal and transvaginal ultrasound examinations of the pelvis were performed. Transabdominal technique was performed for global imaging of the pelvis including uterus, ovaries, adnexal regions, and pelvic cul-de-sac.  It was necessary to proceed with endovaginal exam following the transabdominal exam to visualize the myometrium, endometrium and adnexa.  Comparison:  2010  Findings:  Uterus: Is anteverted and anteflexed and demonstrates a sagittal length of 7.7 cm, depth of 3.7 cm and width of 4.9 cm.  No focal mural abnormality is seen  Endometrium: appears thin and echogenic with a width of 3 mm.  No areas of focal thickening or heterogeneity are seen  Right ovary:  Has a normal appearance measuring 2.9 x 1.4 x 1.8 cm and contains a collapsing corpus luteum  Left ovary: Measures 1.6 x 1.2 x 1.1 cm and has a normal appearance.  Other findings: No pelvic fluid or separate adnexal masses are seen.  IMPRESSION: Normal pelvic ultrasound.   Original Report Authenticated By: Rhodia Albright, M.D.   US Pelvis Complete  05/06/2013   *RADIOLOGY REPORT*  Clinical Data: Right-sided pelvic pain and bleeding for 9 days. LMP 04/28/2013  TRANSABDOMINAL AND TRANSVAGINAL ULTRASOUND OF PELVIS Technique:  Both transabdominal and transvaginal ultrasound examinations of the pelvis were performed. Transabdominal technique was performed for global imaging of the pelvis including uterus, ovaries, adnexal regions, and pelvic cul-de-sac.  It was necessary to proceed with endovaginal exam following the transabdominal exam to visualize the myometrium, endometrium and adnexa.  Comparison:  2010  Findings:  Uterus: Is anteverted and anteflexed and demonstrates a sagittal length of 7.7 cm, depth of 3.7 cm and width of 4.9 cm.  No focal mural abnormality is seen  Endometrium: appears thin and echogenic with a width of 3 mm.  No areas of focal thickening or  heterogeneity are seen  Right ovary:  Has a normal appearance measuring 2.9 x 1.4 x 1.8 cm and contains a collapsing corpus luteum  Left ovary: Measures 1.6 x 1.2 x 1.1 cm and has a normal appearance.  Other findings: No pelvic fluid or separate adnexal masses are seen.  IMPRESSION: Normal pelvic ultrasound.   Original Report Authenticated By: Rhodia Albright, M.D.      Assessment: Parous female desires sterilization Patient Active Problem List   Diagnosis Date Noted  . Abnormal uterine bleeding (AUB) 05/11/2013  . BV (bacterial vaginosis) 05/11/2013    Plan: Laparoscopic BTL  Sundae Maners H 06/01/2013 3:48 PM

## 2013-06-03 ENCOUNTER — Other Ambulatory Visit (HOSPITAL_COMMUNITY): Payer: BC Managed Care – PPO

## 2013-06-06 ENCOUNTER — Encounter (HOSPITAL_COMMUNITY): Payer: Self-pay | Admitting: Pharmacy Technician

## 2013-06-08 NOTE — Patient Instructions (Signed)
[name] [name]  06/08/2013   Your procedure is scheduled on:  06/15/13  Report to [facility] at [facility].  Call this number if you have problems the morning of surgery: [phone]   Remember:   Do not eat food or drink liquids after midnight.   Take these medicines the morning of surgery with A SIP OF WATER: xanax   Do not wear jewelry, make-up or nail polish.  Do not wear lotions, powders, or perfumes. You may wear deodorant.  Do not shave 48 hours prior to surgery. Men may shave face and neck.  Do not bring valuables to the hospital.  [facility] is not responsible                  for any belongings or valuables.               Contacts, dentures or bridgework may not be worn into surgery.  Leave suitcase in the car. After surgery it may be brought to your room.  For patients admitted to the hospital, discharge time is determined by your                treatment team.               Patients discharged the day of surgery will not be allowed to drive  home.  Name and phone number of your driver: family  Special Instructions: Shower using CHG 2 nights before surgery and the night before surgery.  If you shower the day of surgery use CHG.  Use special wash - you have one bottle of CHG for all showers.  You should use approximately 1/3 of the bottle for each shower.   Please read over the following fact sheets that you were given: Surgical Site Infection Prevention, Anesthesia Post-op Instructions and Care and Recovery After Surgery   PATIENT INSTRUCTIONS POST-ANESTHESIA  IMMEDIATELY FOLLOWING SURGERY:  Do not drive or operate machinery for the first twenty four hours after surgery.  Do not make any important decisions for twenty four hours after surgery or while taking narcotic pain medications or sedatives.  If you develop intractable nausea and vomiting or a severe headache please notify your doctor immediately.  FOLLOW-UP:  Please make an appointment with your surgeon as  instructed. You do not need to follow up with anesthesia unless specifically instructed to do so.  WOUND CARE INSTRUCTIONS (if applicable):  Keep a dry clean dressing on the anesthesia/puncture wound site if there is drainage.  Once the wound has quit draining you may leave it open to air.  Generally you should leave the bandage intact for twenty four hours unless there is drainage.  If the epidural site drains for more than 36-48 hours please call the anesthesia department.  QUESTIONS?:  Please feel free to call your physician or the hospital operator if you have any questions, and they will be happy to assist you.      Laparoscopic Tubal Ligation Laparoscopic tubal ligation is a procedure that closes the fallopian tubes at a time other than right after childbirth. By closing the fallopian tubes, the eggs that are released from the ovaries cannot enter the uterus and sperm cannot reach the egg. Tubal ligation is also known as getting your "tubes tied." Tubal ligation is done so you will not be able to get pregnant or have a baby.  Although this procedure may be reversed, it should be considered permanent and irreversible. If you want to have  future pregnancies, you should not have this procedure.  LET YOUR CAREGIVER KNOW ABOUT:  Allergies to food or medicine.  Medicines taken, including vitamins, herbs, eyedrops, over-the-counter medicines, and creams.  Use of steroids (by mouth or creams).  Previous problems with numbing medicines.  History of bleeding problems or blood clots.  Any recent colds or infections.  Previous surgery.  Other health problems, including diabetes and kidney problems.  Possibility of pregnancy, if this applies.  Any past pregnancies. RISKS AND COMPLICATIONS   Infection.  Bleeding.  Injury to surrounding organs.  Anesthetic side effects.  Failure of the procedure.  Ectopic pregnancy.  Future regret about having the procedure done. BEFORE THE  PROCEDURE  Do not take aspirin or blood thinners a week before the procedure or as directed. This can cause bleeding.  Do not eat or drink anything 6 to 8 hours before the procedure. PROCEDURE   You may be given a medicine to help you relax (sedative) before the procedure. You will be given a medicine to make you sleep (general anesthetic) during the procedure.  A tube will be put down your throat to help your breath while under general anesthesia.  Two small cuts (incisions) are made in the lower abdominal area and near the belly button.  Your abdominal area will be inflated with a safe gas (carbon dioxide). This helps give the surgeon room to operate, visualize, and helps the surgeon avoid other organs.  A thin, lighted tube (laparoscope) with a camera attached is inserted into your abdomen through one of the incisions near the belly button. Other small instruments are also inserted through the other abdominal incision.  The fallopian tubes are located and are either blocked with a ring, clip, or are burned (cauterized).  After the fallopian tubes are blocked, the gas is released from the abdomen.  The incisions will be closed with stitches (sutures), and a bandage may be placed over the incisions. AFTER THE PROCEDURE   You will rest in a recovery room for 1 4 hours until you are stable and doing well.  You will also have some mild abdominal discomfort for 3 7 days. You will be given pain medicine to ease any discomfort.  As long as there are no problems, you may be allowed to go home. Someone will need to drive you home and be with you for at least 24 hours once home.  You may have some mild discomfort in the throat. This is from the tube placed in your throat while you were sleeping.  You may experience discomfort in the shoulder area from some trapped air between the liver and diaphragm. This sensation is normal and will slowly go away on its own. Document Released: 12/08/2000  Document Revised: 03/02/2012 Document Reviewed: 12/13/2011 [facility] Patient Information 2014 [place], [place].

## 2013-06-09 ENCOUNTER — Encounter (HOSPITAL_COMMUNITY)
Admission: RE | Admit: 2013-06-09 | Discharge: 2013-06-09 | Disposition: A | Discharge status: Home / Self Care | Payer: BC Managed Care – PPO | Source: Ambulatory Visit | Attending: Obstetrics & Gynecology | Admitting: Obstetrics & Gynecology

## 2013-06-09 ENCOUNTER — Encounter (HOSPITAL_COMMUNITY): Payer: Self-pay

## 2013-06-09 DIAGNOSIS — Z01812 Encounter for preprocedural laboratory examination: Secondary | ICD-10-CM | POA: Insufficient documentation

## 2013-06-09 DIAGNOSIS — Z01818 Encounter for other preprocedural examination: Secondary | ICD-10-CM | POA: Insufficient documentation

## 2013-06-09 LAB — COMPREHENSIVE METABOLIC PANEL
ALT: 17 U/L (ref 0–35)
Albumin: 3.8 g/dL (ref 3.5–5.2)
Alkaline Phosphatase: 58 U/L (ref 39–117)
Glucose, Bld: 88 mg/dL (ref 70–99)
Potassium: 4 mEq/L (ref 3.5–5.1)
Sodium: 138 mEq/L (ref 135–145)
Total Protein: 7.4 g/dL (ref 6.0–8.3)

## 2013-06-09 LAB — URINALYSIS, ROUTINE W REFLEX MICROSCOPIC
Bilirubin Urine: NEGATIVE
Glucose, UA: NEGATIVE mg/dL
Ketones, ur: NEGATIVE mg/dL
pH: 6 (ref 5.0–8.0)

## 2013-06-09 LAB — CBC
Hemoglobin: 13 g/dL (ref 12.0–15.0)
MCHC: 33.9 g/dL (ref 30.0–36.0)
RDW: 12 % (ref 11.5–15.5)
WBC: 4.3 10*3/uL (ref 4.0–10.5)

## 2013-06-09 LAB — URINE MICROSCOPIC-ADD ON

## 2013-06-09 LAB — PREGNANCY, URINE: Preg Test, Ur: NEGATIVE

## 2013-06-15 ENCOUNTER — Ambulatory Visit (HOSPITAL_COMMUNITY): Payer: BC Managed Care – PPO | Admitting: Anesthesiology

## 2013-06-15 ENCOUNTER — Encounter (HOSPITAL_COMMUNITY): Payer: Self-pay | Admitting: *Deleted

## 2013-06-15 ENCOUNTER — Ambulatory Visit (HOSPITAL_COMMUNITY)
Admission: RE | Admit: 2013-06-15 | Discharge: 2013-06-15 | Disposition: A | Discharge status: Home / Self Care | Payer: BC Managed Care – PPO | Source: Ambulatory Visit | Attending: Obstetrics & Gynecology | Admitting: Obstetrics & Gynecology

## 2013-06-15 ENCOUNTER — Encounter: Payer: BC Managed Care – PPO | Admitting: Obstetrics & Gynecology

## 2013-06-15 ENCOUNTER — Encounter (HOSPITAL_COMMUNITY): Payer: Self-pay | Admitting: Anesthesiology

## 2013-06-15 ENCOUNTER — Encounter (HOSPITAL_COMMUNITY)
Admission: RE | Disposition: A | Discharge status: Home / Self Care | Payer: Self-pay | Source: Ambulatory Visit | Attending: Obstetrics & Gynecology

## 2013-06-15 DIAGNOSIS — Z302 Encounter for sterilization: Secondary | ICD-10-CM | POA: Insufficient documentation

## 2013-06-15 DIAGNOSIS — Z9889 Other specified postprocedural states: Secondary | ICD-10-CM

## 2013-06-15 HISTORY — PX: LAPAROSCOPIC TUBAL LIGATION: SHX1937

## 2013-06-15 SURGERY — LIGATION, FALLOPIAN TUBE, LAPAROSCOPIC
Anesthesia: General | Laterality: Bilateral | Wound class: Clean

## 2013-06-15 MED ORDER — FENTANYL CITRATE 0.05 MG/ML IJ SOLN: 25.0000 ug | INTRAMUSCULAR | Status: DC | PRN

## 2013-06-15 MED ORDER — ONDANSETRON HCL 4 MG/2ML IJ SOLN
4.0000 mg | Freq: Once | INTRAMUSCULAR | Status: AC
  Administered 2013-06-15: 4 mg via INTRAVENOUS

## 2013-06-15 MED ORDER — IBUPROFEN 800 MG PO TABS: 800.0000 mg | ORAL_TABLET | Freq: Three times a day (TID) | ORAL | Status: DC | PRN

## 2013-06-15 MED ORDER — 0.9 % SODIUM CHLORIDE (POUR BTL) OPTIME
TOPICAL | Status: DC | PRN
  Administered 2013-06-15: 10 mL

## 2013-06-15 MED ORDER — LIDOCAINE HCL (PF) 1 % IJ SOLN
INTRAMUSCULAR | Status: AC
  Filled 2013-06-15: qty 5

## 2013-06-15 MED ORDER — PROPOFOL 10 MG/ML IV BOLUS
INTRAVENOUS | Status: DC | PRN
  Administered 2013-06-15: 150 mg via INTRAVENOUS

## 2013-06-15 MED ORDER — FENTANYL CITRATE 0.05 MG/ML IJ SOLN
INTRAMUSCULAR | Status: DC | PRN
  Administered 2013-06-15: 100 ug via INTRAVENOUS
  Administered 2013-06-15: 50 ug via INTRAVENOUS
  Administered 2013-06-15: 100 ug via INTRAVENOUS

## 2013-06-15 MED ORDER — LIDOCAINE HCL 1 % IJ SOLN
INTRAMUSCULAR | Status: DC | PRN
  Administered 2013-06-15: 50 mg via INTRADERMAL

## 2013-06-15 MED ORDER — CEFAZOLIN SODIUM-DEXTROSE 2-3 GM-% IV SOLR: 2.0000 g | INTRAVENOUS | Status: DC

## 2013-06-15 MED ORDER — GLYCOPYRROLATE 0.2 MG/ML IJ SOLN
INTRAMUSCULAR | Status: DC | PRN
  Administered 2013-06-15: 0.4 mg via INTRAVENOUS

## 2013-06-15 MED ORDER — MIDAZOLAM HCL 2 MG/2ML IJ SOLN
1.0000 mg | INTRAMUSCULAR | Status: DC | PRN
  Administered 2013-06-15: 2 mg via INTRAVENOUS

## 2013-06-15 MED ORDER — GLYCOPYRROLATE 0.2 MG/ML IJ SOLN
INTRAMUSCULAR | Status: AC
  Filled 2013-06-15: qty 2

## 2013-06-15 MED ORDER — MIDAZOLAM HCL 2 MG/2ML IJ SOLN
INTRAMUSCULAR | Status: AC
  Filled 2013-06-15: qty 2

## 2013-06-15 MED ORDER — SUCCINYLCHOLINE CHLORIDE 20 MG/ML IJ SOLN
INTRAMUSCULAR | Status: AC
  Filled 2013-06-15: qty 1

## 2013-06-15 MED ORDER — FENTANYL CITRATE 0.05 MG/ML IJ SOLN
INTRAMUSCULAR | Status: AC
  Filled 2013-06-15: qty 5

## 2013-06-15 MED ORDER — CEFAZOLIN SODIUM-DEXTROSE 2-3 GM-% IV SOLR
INTRAVENOUS | Status: AC
  Filled 2013-06-15: qty 50

## 2013-06-15 MED ORDER — ROCURONIUM BROMIDE 50 MG/5ML IV SOLN
INTRAVENOUS | Status: AC
  Filled 2013-06-15: qty 1

## 2013-06-15 MED ORDER — PROPOFOL 10 MG/ML IV EMUL
INTRAVENOUS | Status: AC
  Filled 2013-06-15: qty 20

## 2013-06-15 MED ORDER — NEOSTIGMINE METHYLSULFATE 1 MG/ML IJ SOLN
INTRAMUSCULAR | Status: AC
  Filled 2013-06-15: qty 1

## 2013-06-15 MED ORDER — SUCCINYLCHOLINE CHLORIDE 20 MG/ML IJ SOLN
INTRAMUSCULAR | Status: DC | PRN
  Administered 2013-06-15: 150 mg via INTRAVENOUS

## 2013-06-15 MED ORDER — NEOSTIGMINE METHYLSULFATE 1 MG/ML IJ SOLN
INTRAMUSCULAR | Status: DC | PRN
  Administered 2013-06-15 (×2): 1 mg via INTRAVENOUS

## 2013-06-15 MED ORDER — KETOROLAC TROMETHAMINE 30 MG/ML IJ SOLN
30.0000 mg | Freq: Once | INTRAMUSCULAR | Status: AC
  Administered 2013-06-15: 30 mg via INTRAVENOUS
  Filled 2013-06-15: qty 1

## 2013-06-15 MED ORDER — ONDANSETRON HCL 8 MG PO TABS: 8.0000 mg | ORAL_TABLET | Freq: Three times a day (TID) | ORAL | Status: DC | PRN

## 2013-06-15 MED ORDER — HYDROCODONE-ACETAMINOPHEN 5-325 MG PO TABS: 1.0000 | ORAL_TABLET | Freq: Four times a day (QID) | ORAL | Status: DC | PRN

## 2013-06-15 MED ORDER — ONDANSETRON HCL 4 MG/2ML IJ SOLN
INTRAMUSCULAR | Status: AC
  Filled 2013-06-15: qty 2

## 2013-06-15 MED ORDER — ROCURONIUM BROMIDE 100 MG/10ML IV SOLN
INTRAVENOUS | Status: DC | PRN
  Administered 2013-06-15: 10 mg via INTRAVENOUS
  Administered 2013-06-15: 25 mg via INTRAVENOUS

## 2013-06-15 MED ORDER — LACTATED RINGERS IV SOLN
INTRAVENOUS | Status: DC
  Administered 2013-06-15: 1000 mL via INTRAVENOUS
  Administered 2013-06-15: 08:00:00 via INTRAVENOUS

## 2013-06-15 MED ORDER — MIDAZOLAM HCL 5 MG/5ML IJ SOLN
INTRAMUSCULAR | Status: DC | PRN
  Administered 2013-06-15: 2 mg via INTRAVENOUS

## 2013-06-15 MED ORDER — CEFAZOLIN SODIUM-DEXTROSE 2-3 GM-% IV SOLR
INTRAVENOUS | Status: DC | PRN
  Administered 2013-06-15: 2 g via INTRAVENOUS

## 2013-06-15 MED ORDER — ONDANSETRON HCL 4 MG/2ML IJ SOLN: 4.0000 mg | Freq: Once | INTRAMUSCULAR | Status: DC | PRN

## 2013-06-15 SURGICAL SUPPLY — 29 items
BAG HAMPER (MISCELLANEOUS) ×2 IMPLANT
BLADE SURG SZ11 CARB STEEL (BLADE) ×2 IMPLANT
CLOTH BEACON ORANGE TIMEOUT ST (SAFETY) ×2 IMPLANT
COVER LIGHT HANDLE STERIS (MISCELLANEOUS) ×4 IMPLANT
ELECT REM PT RETURN 9FT ADLT (ELECTROSURGICAL) ×2
ELECTRODE REM PT RTRN 9FT ADLT (ELECTROSURGICAL) ×1 IMPLANT
GLOVE BIOGEL PI IND STRL 7.0 (GLOVE) ×2 IMPLANT
GLOVE BIOGEL PI IND STRL 8 (GLOVE) ×1 IMPLANT
GLOVE BIOGEL PI INDICATOR 7.0 (GLOVE) ×2
GLOVE BIOGEL PI INDICATOR 8 (GLOVE) ×1
GLOVE ECLIPSE 6.5 STRL STRAW (GLOVE) ×2 IMPLANT
GLOVE ECLIPSE 8.0 STRL XLNG CF (GLOVE) ×2 IMPLANT
GOWN STRL REIN XL XLG (GOWN DISPOSABLE) ×2 IMPLANT
INST SET LAPROSCOPIC GYN AP (KITS) ×2 IMPLANT
KIT ROOM TURNOVER AP CYSTO (KITS) ×2 IMPLANT
MANIFOLD NEPTUNE II (INSTRUMENTS) ×2 IMPLANT
NEEDLE INSUFFLATION 14GA 120MM (NEEDLE) ×2 IMPLANT
PACK PERI GYN (CUSTOM PROCEDURE TRAY) ×2 IMPLANT
PAD ARMBOARD 7.5X6 YLW CONV (MISCELLANEOUS) ×2 IMPLANT
SET BASIN LINEN APH (SET/KITS/TRAYS/PACK) ×2 IMPLANT
SOLUTION ANTI FOG 6CC (MISCELLANEOUS) ×2 IMPLANT
SPONGE GAUZE 2X2 8PLY STRL LF (GAUZE/BANDAGES/DRESSINGS) ×2 IMPLANT
STAPLER VISISTAT 35W (STAPLE) ×2 IMPLANT
SUT VICRYL 0 UR6 27IN ABS (SUTURE) ×2 IMPLANT
SYRINGE 10CC LL (SYRINGE) ×2 IMPLANT
TAPE CLOTH SURG 4X10 WHT LF (GAUZE/BANDAGES/DRESSINGS) ×2 IMPLANT
TROCAR ENDO BLADELESS 11MM (ENDOMECHANICALS) ×2 IMPLANT
TUBING INSUFFLATION HIGH FLOW (TUBING) ×2 IMPLANT
WARMER LAPAROSCOPE (MISCELLANEOUS) ×2 IMPLANT

## 2013-06-15 NOTE — Transfer of Care (Signed)
Immediate Anesthesia Transfer of Care Note  Patient: [name] [name]  Procedure(s) Performed: Procedure(s): LAPAROSCOPIC BILATERAL TUBAL LIGATION WITH CAUTERY (Bilateral)  Patient Location: PACU  Anesthesia Type:General  Level of Consciousness: awake, alert , oriented and patient cooperative  Airway & Oxygen Therapy: Patient Spontanous Breathing and Patient connected to face mask oxygen  Post-op Assessment: Report given to PACU RN, Post -op Vital signs reviewed and stable and Patient moving all extremities  Post vital signs: Reviewed and stable  Complications: No apparent anesthesia complications

## 2013-06-15 NOTE — Brief Op Note (Signed)
Pt states cefdinir makes her sick   Dr Despina Hidden states okay to infuse cefazolin 2 grams

## 2013-06-15 NOTE — Op Note (Signed)
Preoperative Diagnosis:  Multiparous female desires permanent sterilization  Postoperative Diagnosis:  Same as above  Procedure:  Laparoscopic Bilateral Tubal Ligation using electrocautery  Surgeon:  Rockne Coons MD  Anaesthesia:  LMA  Findings:  Patient had normal pelvic anatomy and no intraperitoneal abnormalities.  Description of Operation:  Patient was taken to the OR and placed into supine position where she underwent LMA anaesthesia.  She was placed in the dorsal lithotomy position and prepped and draped in the usual sterile fashion.  An incision was made in the umbilicus and dissection taken down to the rectus fascia which was incised and opened.  The non bladed trocar was then placed and the peritoneal cavity was insufflated.  The above noted findings were observed.  The Kleppinger electrocautery unit was employed and both tubes were burned to no resistance and beyond in the distal ishtmic and ampullary regions, approximately a 3.5 cm segment bilaterally.  There was good hemostasis.  The fascia, peritoneum and subcutaneous tissue were closed using 0 vicryl.  The skin was closed using staples.  The patient was awakened from anaesthesia and taken to the PACU with all counts being correct x 3.  The patient received Ancef 2 gram and Toradol 30 mg IV preoperatively.  Lillyann Ahart H 06/15/2013 8:22 AM

## 2013-06-15 NOTE — Anesthesia Preprocedure Evaluation (Signed)
Anesthesia Evaluation  Patient identified by MRN, date of birth, ID band Patient awake    Reviewed: Allergy & Precautions, H&P , NPO status , Patient's Chart, lab work & pertinent test results  Airway Mallampati: II TM Distance: >3 FB     Dental  (+) Teeth Intact   Pulmonary former smoker,  breath sounds clear to auscultation        Cardiovascular negative cardio ROS  Rhythm:Regular Rate:Normal     Neuro/Psych  Headaches, PSYCHIATRIC DISORDERS Depression    GI/Hepatic GERD-  Medicated,  Endo/Other    Renal/GU      Musculoskeletal  (+) Fibromyalgia -  Abdominal   Peds  Hematology   Anesthesia Other Findings   Reproductive/Obstetrics                           Anesthesia Physical Anesthesia Plan  ASA: II  Anesthesia Plan: General   Post-op Pain Management:    Induction: Intravenous, Rapid sequence and Cricoid pressure planned  Airway Management Planned: Oral ETT  Additional Equipment:   Intra-op Plan:   Post-operative Plan: Extubation in OR  Informed Consent: I have reviewed the patients History and Physical, chart, labs and discussed the procedure including the risks, benefits and alternatives for the proposed anesthesia with the patient or authorized representative who has indicated his/her understanding and acceptance.     Plan Discussed with:   Anesthesia Plan Comments:         Anesthesia Quick Evaluation

## 2013-06-15 NOTE — Anesthesia Postprocedure Evaluation (Signed)
  Anesthesia Post-op Note  Patient: [name] [name]  Procedure(s) Performed: Procedure(s): LAPAROSCOPIC BILATERAL TUBAL LIGATION WITH CAUTERY (Bilateral)  Patient Location: PACU  Anesthesia Type:General  Level of Consciousness: awake, alert , oriented and patient cooperative  Airway and Oxygen Therapy: Patient Spontanous Breathing  Post-op Pain: none  Post-op Assessment: Post-op Vital signs reviewed, Patient's Cardiovascular Status Stable, Respiratory Function Stable, Patent Airway, No signs of Nausea or vomiting and Pain level controlled  Post-op Vital Signs: Reviewed and stable  Complications: No apparent anesthesia complications

## 2013-06-15 NOTE — Anesthesia Procedure Notes (Addendum)
Spinal  Start time: 06/15/2013 7:41 AM Staffing CRNA/Resident: Despina Hidden  Procedure Name: Intubation Date/Time: 06/15/2013 7:41 AM Performed by: Despina Hidden Pre-anesthesia Checklist: Emergency Drugs available, Suction available, Patient being monitored and Patient identified Patient Re-evaluated:Patient Re-evaluated prior to inductionOxygen Delivery Method: Circle system utilized Preoxygenation: Pre-oxygenation with 100% oxygen Intubation Type: IV induction, Rapid sequence and Cricoid Pressure applied Ventilation: Mask ventilation without difficulty Laryngoscope Size: Mac and 3 Grade View: Grade I Tube type: Oral Tube size: 7.0 mm Number of attempts: 1 Airway Equipment and Method: Stylet Placement Confirmation: ETT inserted through vocal cords under direct vision,  breath sounds checked- equal and bilateral and positive ETCO2 Secured at: 22 cm Tube secured with: Tape Dental Injury: Teeth and Oropharynx as per pre-operative assessment

## 2013-06-15 NOTE — H&P (Signed)
Preoperative History and Physical  [name] [name] is a 32 y.o. G1P1001 with Patient's last menstrual period was Patient's last menstrual period was 06/05/2013. Marland Kitchen admitted for a elective sterilization via laparoscopic bilateral tubal ligation.  PMH:  Past Medical History   Diagnosis  Date   .  Migraine    .  Colitis    .  GERD (gastroesophageal reflux disease)    .  Insomnia    .  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  Fibromyalgia    .  Heart murmur    .  Abnormal Pap smear      Multiple   .  Abnormal uterine bleeding (AUB)  05/11/2013   .  BV (bacterial vaginosis)  05/11/2013   PSH:  Past Surgical History   Procedure  Laterality  Date   .  Wisdom tooth extraction     .  Colposcopy       multiple   POb/GynH:  OB History    Grav  Para  Term  Preterm  Abortions  TAB  SAB  Ect  Mult  Living    1  1  1        1      SH:  History   Substance Use Topics   .  Smoking status:  Former Smoker     Types:  Cigarettes     Quit date:  12/02/2007   .  Smokeless tobacco:  Never Used   .  Alcohol Use:  No   FH:  Family History   Problem  Relation  Age of Onset   .  COPD  Father    .  Diabetes  Father    .  Heart disease  Father    .  Diverticulosis  Father    .  Diabetes  Sister    .  Leukemia  Sister    .  Diabetes  Other    .  Cancer  Other    .  Thyroid disease  Other    Allergies:  Allergies   Allergen  Reactions   .  Cefdinir  Hives and Nausea And Vomiting     Patient reported allergic reaction on 10/08/12.   .  Cortisone      Red face, hoarse voice following cortisone injection   .  Flexeril [Cyclobenzaprine]  Itching   Medications: Current outpatient prescriptions:ALPRAZolam (XANAX) 0.5 MG tablet, Take 0.5 mg by mouth at bedtime as needed for anxiety. For sleep, Disp: , Rfl: ; EPINEPHrine (EPI-PEN) 0.3 mg/0.3 mL DEVI, Inject 0.3 mLs (0.3 mg total) into the muscle once., Disp: 1 Device, Rfl: 2; ibuprofen (ADVIL,MOTRIN) 600 MG tablet, Take 1  tablet (600 mg total) by mouth every 6 (six) hours as needed for pain., Disp: 30 tablet, Rfl: 1  metroNIDAZOLE (FLAGYL) 500 MG tablet, Take 1 tablet (500 mg total) by mouth 2 (two) times daily., Disp: 14 tablet, Rfl: 0; XULANE 150-35 MCG/24HR transdermal patch, Place 1 patch onto the skin once a week. , Disp: , Rfl:  Review of Systems:  Review of Systems  Constitutional: Negative for fever, chills, weight loss, malaise/fatigue and diaphoresis.  HENT: Negative for hearing loss, ear pain, nosebleeds, congestion, sore throat, neck pain, tinnitus and ear discharge.  Eyes: Negative for blurred vision, double vision, photophobia, pain, discharge and redness.  Respiratory: Negative for cough, hemoptysis, sputum production, shortness of breath, wheezing and stridor.  Cardiovascular: Negative for chest pain, palpitations, orthopnea,  claudication, leg swelling and PND.  Gastrointestinal: Negative for abdominal pain. Negative for heartburn, nausea, vomiting, diarrhea, constipation, blood in stool and melena.  Genitourinary: Negative for dysuria, urgency, frequency, hematuria and flank pain.  Musculoskeletal: Negative for myalgias, back pain, joint pain and falls.  Skin: Negative for itching and rash.  Neurological: Negative for dizziness, tingling, tremors, sensory change, speech change, focal weakness, seizures, loss of consciousness, weakness and headaches.  Endo/Heme/Allergies: Negative for environmental allergies and polydipsia. Does not bruise/bleed easily.  Psychiatric/Behavioral: Negative for depression, suicidal ideas, hallucinations, memory loss and substance abuse. The patient is not nervous/anxious and does not have insomnia.  PHYSICAL EXAM:  Blood pressure 108/64, height 5\' 2"  (1.575 m), weight 181 lb (82.101 kg), last menstrual period 04/28/2013.  Vitals reviewed.  Constitutional: She is oriented to person, place, and time. She appears well-developed and well-nourished.  HENT:  Head:  Normocephalic and atraumatic.  Right Ear: External ear normal.  Left Ear: External ear normal.  Nose: Nose normal.  Mouth/Throat: Oropharynx is clear and moist.  Eyes: Conjunctivae and EOM are normal. Pupils are equal, round, and reactive to light. Right eye exhibits no discharge. Left eye exhibits no discharge. No scleral icterus.  Neck: Normal range of motion. Neck supple. No tracheal deviation present. No thyromegaly present.  Cardiovascular: Normal rate, regular rhythm, normal heart sounds and intact distal pulses. Exam reveals no gallop and no friction rub.  No murmur heard.  Respiratory: Effort normal and breath sounds normal. No respiratory distress. She has no wheezes. She has no rales. She exhibits no tenderness.  GI: Soft. Bowel sounds are normal. She exhibits no distension and no mass. There is tenderness. There is no rebound and no guarding.  Genitourinary:  Vulva is normal without lesions Vagina is pink moist without discharge Cervix normal in appearance and pap is normal Uterus is normal by sonogram Adnexa is negative By sonogram Musculoskeletal: Normal range of motion. She exhibits no edema and no tenderness.  Neurological: She is alert and oriented to person, place, and time. She has normal reflexes. She displays normal reflexes. No cranial nerve deficit. She exhibits normal muscle tone. Coordination normal.  Skin: Skin is warm and dry. No rash noted. No erythema. No pallor.  Psychiatric: She has a normal mood and affect. Her behavior is normal. Judgment and thought content normal.  Labs:  Results for orders placed during the hospital encounter of 06/09/13 (from the past 336 hour(s))  URINALYSIS, ROUTINE W REFLEX MICROSCOPIC   Collection Time    06/09/13  8:08 AM      Result Value Range   Color, Urine YELLOW  YELLOW   APPearance CLEAR  CLEAR   Specific Gravity, Urine >1.030 (*) 1.005 - 1.030   pH 6.0  5.0 - 8.0   Glucose, UA NEGATIVE  NEGATIVE mg/dL   Hgb urine  dipstick NEGATIVE  NEGATIVE   Bilirubin Urine NEGATIVE  NEGATIVE   Ketones, ur NEGATIVE  NEGATIVE mg/dL   Protein, ur NEGATIVE  NEGATIVE mg/dL   Urobilinogen, UA 0.2  0.0 - 1.0 mg/dL   Nitrite NEGATIVE  NEGATIVE   Leukocytes, UA TRACE (*) NEGATIVE  URINE MICROSCOPIC-ADD ON   Collection Time    06/09/13  8:08 AM      Result Value Range   Squamous Epithelial / LPF FEW (*) RARE   WBC, UA 0-2  <3 WBC/hpf   Bacteria, UA MANY (*) RARE  CBC   Collection Time    06/09/13  8:30 AM  Result Value Range   WBC 4.3  4.0 - 10.5 K/uL   RBC 4.48  3.87 - 5.11 MIL/uL   Hemoglobin 13.0  12.0 - 15.0 g/dL   HCT 16.1  09.6 - 04.5 %   MCV 85.5  78.0 - 100.0 fL   MCH 29.0  26.0 - 34.0 pg   MCHC 33.9  30.0 - 36.0 g/dL   RDW 40.9  81.1 - 91.4 %   Platelets 253  150 - 400 K/uL  COMPREHENSIVE METABOLIC PANEL   Collection Time    06/09/13  8:30 AM      Result Value Range   Sodium 138  135 - 145 mEq/L   Potassium 4.0  3.5 - 5.1 mEq/L   Chloride 103  96 - 112 mEq/L   CO2 23  19 - 32 mEq/L   Glucose, Bld 88  70 - 99 mg/dL   BUN 12  6 - 23 mg/dL   Creatinine, Ser 7.82  0.50 - 1.10 mg/dL   Calcium 9.7  8.4 - 95.6 mg/dL   Total Protein 7.4  6.0 - 8.3 g/dL   Albumin 3.8  3.5 - 5.2 g/dL   AST 22  0 - 37 U/L   ALT 17  0 - 35 U/L   Alkaline Phosphatase 58  39 - 117 U/L   Total Bilirubin 0.3  0.3 - 1.2 mg/dL   GFR calc non Af Amer >90  >90 mL/min   GFR calc Af Amer >90  >90 mL/min  PREGNANCY, URINE   Collection Time    06/09/13 10:11 AM      Result Value Range   Preg Test, Ur NEGATIVE  NEGATIVE  Results for orders placed in visit on 06/01/13 (from the past 336 hour(s))  POCT URINE PREGNANCY   Collection Time    06/01/13  3:43 PM      Result Value Range   Preg Test, Ur Negative      Imaging Studies:  US Transvaginal Non-ob  05/06/2013 *RADIOLOGY REPORT* Clinical Data: Right-sided pelvic pain and bleeding for 9 days. LMP 04/28/2013 TRANSABDOMINAL AND TRANSVAGINAL ULTRASOUND OF PELVIS Technique:  Both transabdominal and transvaginal ultrasound examinations of the pelvis were performed. Transabdominal technique was performed for global imaging of the pelvis including uterus, ovaries, adnexal regions, and pelvic cul-de-sac. It was necessary to proceed with endovaginal exam following the transabdominal exam to visualize the myometrium, endometrium and adnexa. Comparison: 2010 Findings: Uterus: Is anteverted and anteflexed and demonstrates a sagittal length of 7.7 cm, depth of 3.7 cm and width of 4.9 cm. No focal mural abnormality is seen Endometrium: appears thin and echogenic with a width of 3 mm. No areas of focal thickening or heterogeneity are seen Right ovary: Has a normal appearance measuring 2.9 x 1.4 x 1.8 cm and contains a collapsing corpus luteum Left ovary: Measures 1.6 x 1.2 x 1.1 cm and has a normal appearance. Other findings: No pelvic fluid or separate adnexal masses are seen. IMPRESSION: Normal pelvic ultrasound. Original Report Authenticated By: Rhodia Albright, M.D.  US Pelvis Complete  05/06/2013 *RADIOLOGY REPORT* Clinical Data: Right-sided pelvic pain and bleeding for 9 days. LMP 04/28/2013 TRANSABDOMINAL AND TRANSVAGINAL ULTRASOUND OF PELVIS Technique: Both transabdominal and transvaginal ultrasound examinations of the pelvis were performed. Transabdominal technique was performed for global imaging of the pelvis including uterus, ovaries, adnexal regions, and pelvic cul-de-sac. It was necessary to proceed with endovaginal exam following the transabdominal exam to visualize the myometrium, endometrium and adnexa. Comparison: 2010 Findings: Uterus: Is  anteverted and anteflexed and demonstrates a sagittal length of 7.7 cm, depth of 3.7 cm and width of 4.9 cm. No focal mural abnormality is seen Endometrium: appears thin and echogenic with a width of 3 mm. No areas of focal thickening or heterogeneity are seen Right ovary: Has a normal appearance measuring 2.9 x 1.4 x 1.8 cm and contains a  collapsing corpus luteum Left ovary: Measures 1.6 x 1.2 x 1.1 cm and has a normal appearance. Other findings: No pelvic fluid or separate adnexal masses are seen. IMPRESSION: Normal pelvic ultrasound. Original Report Authenticated By: Rhodia Albright, M.D.  Assessment:  Parous female desires sterilization  Patient Active Problem List    Diagnosis  Date Noted   .  Abnormal uterine bleeding (AUB)  05/11/2013   .  BV (bacterial vaginosis)  05/11/2013   Plan:  Laparoscopic BTL  EURE,LUTHER H 06/15/2013 7:16 AM

## 2013-06-16 ENCOUNTER — Encounter (HOSPITAL_COMMUNITY): Payer: Self-pay | Admitting: Obstetrics & Gynecology

## 2013-06-22 ENCOUNTER — Encounter (INDEPENDENT_AMBULATORY_CARE_PROVIDER_SITE_OTHER): Payer: Self-pay

## 2013-06-22 ENCOUNTER — Encounter: Payer: Self-pay | Admitting: Obstetrics & Gynecology

## 2013-06-22 ENCOUNTER — Ambulatory Visit (INDEPENDENT_AMBULATORY_CARE_PROVIDER_SITE_OTHER): Payer: Self-pay | Admitting: Obstetrics & Gynecology

## 2013-06-22 VITALS — BP 110/60 | Wt 177.0 lb

## 2013-06-22 DIAGNOSIS — Z9889 Other specified postprocedural states: Secondary | ICD-10-CM

## 2013-06-22 NOTE — Progress Notes (Signed)
Patient ID: [name] [name], female   DOB: 03/25/1981, 32 y.o.   MRN: [id number] POD #8 Lap BTL No complaints Incision clean dry intact  sataples removed  Recommend miralax for BM  Follow up prn or yearly

## 2013-07-11 ENCOUNTER — Other Ambulatory Visit: Payer: Self-pay | Admitting: Nurse Practitioner

## 2013-09-12 ENCOUNTER — Other Ambulatory Visit: Payer: Self-pay | Admitting: Family Medicine

## 2013-09-12 NOTE — Telephone Encounter (Signed)
Ok times one needs ov with scott bef further

## 2013-09-16 ENCOUNTER — Encounter: Payer: Self-pay | Admitting: Nurse Practitioner

## 2013-09-16 ENCOUNTER — Ambulatory Visit (INDEPENDENT_AMBULATORY_CARE_PROVIDER_SITE_OTHER): Payer: BC Managed Care – PPO | Admitting: Nurse Practitioner

## 2013-09-16 VITALS — BP 100/60 | Ht 62.0 in | Wt 170.1 lb

## 2013-09-16 DIAGNOSIS — G43109 Migraine with aura, not intractable, without status migrainosus: Secondary | ICD-10-CM

## 2013-09-16 MED ORDER — RIZATRIPTAN BENZOATE 10 MG PO TBDP: 10.0000 mg | ORAL_TABLET | ORAL | Status: DC | PRN

## 2013-09-19 ENCOUNTER — Encounter: Payer: Self-pay | Admitting: Nurse Practitioner

## 2013-09-19 DIAGNOSIS — G43109 Migraine with aura, not intractable, without status migrainosus: Secondary | ICD-10-CM | POA: Insufficient documentation

## 2013-09-19 NOTE — Assessment & Plan Note (Signed)
Refill Maxalt as directed. Unsure at this point if aura and headaches are going to be persistent; if this is the case patient to call back suite can begin daily preventive medication. Continue to keep headache diary and look for any specific triggers. Warning signs reviewed including change in migraine symptomatology or worsening symptoms.

## 2013-09-19 NOTE — Progress Notes (Signed)
Subjective:  Presents for recheck of her migraines. Has had frequent auras at least once a day over the past 2 weeks. Occasional very mild migraine headache. Usually relieved with Excedrin. Has not used her Maxalt. No fever. Frontal area pressure. No relief with sinus medication. No nausea vomiting. Slight visual changes noted during aura. Maxalt has worked very well for her migraines. Had a tubal ligation in August, regular cycles slightly heavier flow. Has not identified any specific triggers in her headache diary. No ear pain. No sore throat. No coughing. Had her worst headache yesterday, was not bad enough to use Maxalt. No change in symptomatology.  Objective:   BP 100/60  Ht 5\' 2"  (1.575 m)  Wt 170 lb 2 oz (77.168 kg)  BMI 31.11 kg/m2 NAD. Alert, oriented. TMs minimal clear effusion, no erythema. Pharynx clear. Neck supple with mild soft adenopathy. Lungs clear. Heart regular rate rhythm.  Assessment:Migraine headache with aura  Plan: Meds ordered this encounter  Medications  . rizatriptan (MAXALT-MLT) 10 MG disintegrating tablet    Sig: Take 1 tablet (10 mg total) by mouth as needed for migraine. May repeat in 2 hours if needed    Dispense:  10 tablet    Refill:  2    Order Specific Question:  Supervising Provider    Answer:  Mikey Kirschner [[id number]]  . HYDROcodone-acetaminophen (NORCO/VICODIN) 5-325 MG per tablet    Sig:   . ibuprofen (ADVIL,MOTRIN) 800 MG tablet    Sig:    Refill Maxalt as directed. Unsure at this point if aura and headaches are going to be persistent; if this is the case patient to call back suite can begin daily preventive medication. Continue to keep headache diary and look for any specific triggers. Warning signs reviewed including change in migraine symptomatology or worsening symptoms.

## 2013-11-14 ENCOUNTER — Ambulatory Visit (INDEPENDENT_AMBULATORY_CARE_PROVIDER_SITE_OTHER): Payer: BC Managed Care – PPO | Admitting: Family Medicine

## 2013-11-14 ENCOUNTER — Encounter: Payer: Self-pay | Admitting: Family Medicine

## 2013-11-14 VITALS — BP 106/78 | Temp 98.2°F | Ht 62.0 in | Wt 168.4 lb

## 2013-11-14 DIAGNOSIS — L709 Acne, unspecified: Secondary | ICD-10-CM

## 2013-11-14 DIAGNOSIS — L708 Other acne: Secondary | ICD-10-CM

## 2013-11-14 DIAGNOSIS — L738 Other specified follicular disorders: Secondary | ICD-10-CM

## 2013-11-14 DIAGNOSIS — L678 Other hair color and hair shaft abnormalities: Secondary | ICD-10-CM

## 2013-11-14 DIAGNOSIS — L739 Follicular disorder, unspecified: Secondary | ICD-10-CM

## 2013-11-14 MED ORDER — ALPRAZOLAM 0.5 MG PO TABS: ORAL_TABLET | ORAL | Status: DC

## 2013-11-14 MED ORDER — DOXYCYCLINE HYCLATE 100 MG PO CAPS: 100.0000 mg | ORAL_CAPSULE | Freq: Two times a day (BID) | ORAL | Status: AC

## 2013-11-14 NOTE — Progress Notes (Signed)
   Subjective:    Patient ID: [name] [name], female    DOB: [birth year], 33 y.o.   MRN: [id number]  HPI Patient arrives with complaint of painful rash on back for 2 weeks-states the area will bleed and scab over. Patient does relate some intermittent acne on the face. She denies any other particular problems currently  Review of Systems No fevers head congestion drainage cough or wheezing    Objective:   Physical Exam Folliculitis noted on her back some acne noted on her face mainly around her chin       Assessment & Plan:  Folliculitis along with adult acne-doxycycline twice a day for 10 days then 1 daily for the next several weeks if reoccurring may need doxycycline again.

## 2013-11-14 NOTE — Patient Instructions (Signed)
One twice daily for 10 days then once daily. Take with a snack and wash it down with a tall glass of water

## 2014-02-08 ENCOUNTER — Encounter: Payer: Self-pay | Admitting: Nurse Practitioner

## 2014-02-08 ENCOUNTER — Ambulatory Visit (INDEPENDENT_AMBULATORY_CARE_PROVIDER_SITE_OTHER): Payer: BC Managed Care – PPO | Admitting: Nurse Practitioner

## 2014-02-08 VITALS — BP 110/68 | Temp 98.4°F | Ht 62.0 in | Wt 161.0 lb

## 2014-02-08 DIAGNOSIS — L0291 Cutaneous abscess, unspecified: Secondary | ICD-10-CM

## 2014-02-08 DIAGNOSIS — L0390 Cellulitis, unspecified: Principal | ICD-10-CM

## 2014-02-08 MED ORDER — HYDROCODONE-ACETAMINOPHEN 5-325 MG PO TABS: 1.0000 | ORAL_TABLET | ORAL | Status: DC | PRN

## 2014-02-08 MED ORDER — SULFAMETHOXAZOLE-TMP DS 800-160 MG PO TABS: 1.0000 | ORAL_TABLET | Freq: Two times a day (BID) | ORAL | Status: DC

## 2014-02-09 ENCOUNTER — Encounter: Payer: Self-pay | Admitting: Nurse Practitioner

## 2014-02-09 NOTE — Progress Notes (Signed)
Subjective:  Presents complaints of painful spot near her left breast that began 4 days ago. Area of tenderness has spread slightly. Slight increase in size. No fever. No other rash.  Objective:   BP 110/68  Temp(Src) 98.4 F (36.9 C) (Oral)  Ht 5\' 2"  (1.575 m)  Wt 161 lb (73.029 kg)  BMI 29.44 kg/m2 Approximately 1/2 cm erythematous raised smooth nodule noted medial aspect outer edge of the left breast. Area is superficial. Tender to palpation. Slight tenderness around the lesion, no erythema in this area. Left breast: No nodules are masses, axillae no adenopathy. No other rash is noted.   Assessment:Cellulitis and abscess  Plan: Meds ordered this encounter  Medications  . sulfamethoxazole-trimethoprim (BACTRIM DS) 800-160 MG per tablet    Sig: Take 1 tablet by mouth 2 (two) times daily.    Dispense:  14 tablet    Refill:  0    Order Specific Question:  Supervising Provider    Answer:  Mikey Kirschner [[id number]]  . HYDROcodone-acetaminophen (NORCO/VICODIN) 5-325 MG per tablet    Sig: Take 1 tablet by mouth every 4 (four) hours as needed.    Dispense:  20 tablet    Refill:  0    Order Specific Question:  Supervising Provider    Answer:  Mikey Kirschner [[id number]]   warm compresses. Warning signs reviewed. Call back in 48 hours if no significant improvement, sooner if worse.

## 2014-02-14 ENCOUNTER — Encounter: Payer: Self-pay | Admitting: Family Medicine

## 2014-02-14 ENCOUNTER — Ambulatory Visit (INDEPENDENT_AMBULATORY_CARE_PROVIDER_SITE_OTHER): Payer: BC Managed Care – PPO | Admitting: Family Medicine

## 2014-02-14 VITALS — BP 110/72 | Temp 98.5°F | Ht 62.0 in | Wt 161.0 lb

## 2014-02-14 DIAGNOSIS — J310 Chronic rhinitis: Secondary | ICD-10-CM

## 2014-02-14 DIAGNOSIS — J329 Chronic sinusitis, unspecified: Secondary | ICD-10-CM

## 2014-02-14 DIAGNOSIS — M549 Dorsalgia, unspecified: Secondary | ICD-10-CM

## 2014-02-14 LAB — POCT URINALYSIS DIPSTICK: pH, UA: 7

## 2014-02-14 MED ORDER — DOXYCYCLINE HYCLATE 100 MG PO TABS: 100.0000 mg | ORAL_TABLET | Freq: Two times a day (BID) | ORAL | Status: DC

## 2014-02-14 NOTE — Progress Notes (Signed)
   Subjective:    Patient ID: MARYSUE FAIT, female    DOB: 05/19/1981, 33 y.o.   MRN: [id number]  Fever  This is a new problem. The current episode started yesterday. Associated symptoms include headaches. Associated symptoms comments: Right lower back pain and fatigue. She has tried acetaminophen for the symptoms.    tmax 102 felt bad  Diminished energy achey  freq urination  Had zofran on hand   Sig headache  Review of Systems  Constitutional: Positive for fever.  Neurological: Positive for headaches.       Objective:   Physical Exam Alert mild malaise. HEENT normal. No true CVA tenderness. Abdomen benign. Lungs clear.  Urinalysis within normal limits.       Assessment & Plan:  Impression viral syndrome question rhinosinusitis component. Plan antibiotics prescribed. Symptomatic care discussed. Warning signs discussed. WSL

## 2014-06-06 ENCOUNTER — Encounter: Payer: Self-pay | Admitting: Family Medicine

## 2014-06-06 ENCOUNTER — Ambulatory Visit (INDEPENDENT_AMBULATORY_CARE_PROVIDER_SITE_OTHER): Payer: BC Managed Care – PPO | Admitting: Family Medicine

## 2014-06-06 VITALS — BP 110/74 | Ht 62.0 in | Wt 159.0 lb

## 2014-06-06 DIAGNOSIS — N6310 Unspecified lump in the right breast, unspecified quadrant: Secondary | ICD-10-CM

## 2014-06-06 DIAGNOSIS — N63 Unspecified lump in unspecified breast: Secondary | ICD-10-CM

## 2014-06-06 MED ORDER — DOXYCYCLINE HYCLATE 100 MG PO CAPS: 100.0000 mg | ORAL_CAPSULE | Freq: Two times a day (BID) | ORAL | Status: AC

## 2014-06-06 MED ORDER — HYDROCODONE-ACETAMINOPHEN 5-325 MG PO TABS: 1.0000 | ORAL_TABLET | ORAL | Status: DC | PRN

## 2014-06-06 NOTE — Progress Notes (Signed)
   Subjective:    Patient ID: [name] [name], female    DOB: 10-08-1980, 33 y.o.   MRN: [id number]  HPI Patient states it's her Right breast that has pressure  & tenderness @ the same time. Patient states this has been going on for the last 4 days. She states that she has always been asleep or on her abdomen and she noticed pain and discomfort along the right side of the breast over the past 4 days. Difficult to lay on it she denies fever chills sweats nausea or vomiting. She denies any family history of breast disease. She denies any injury to this area. No weight loss denies high fever sweats or chills  Review of Systems Please see above    Objective:   Physical Exam Her lungs are clear heart is regular breast exam bilateral was completed with nurse present. Left breast was nontender no masses felt right breast had firm area all in the 9:00 position on the lateral aspect of the breast. No other masses were felt. There was no redness or dimpling of the skin.       Assessment & Plan:  Breast tenderness with possible growth I recommend diagnostic mammogram as well as an ultrasound. Also recommend antibiotics over the next 10 days along with warm compresses in addition to this I recommend a recheck in 10 days. If persistent pain, persistent abnormal finding, or abnormal mammogram/ultrasound and referral to breast surgeon in [place]

## 2014-06-19 ENCOUNTER — Encounter: Payer: Self-pay | Admitting: Family Medicine

## 2014-06-19 ENCOUNTER — Ambulatory Visit (INDEPENDENT_AMBULATORY_CARE_PROVIDER_SITE_OTHER): Payer: BC Managed Care – PPO | Admitting: Family Medicine

## 2014-06-19 VITALS — BP 124/68 | Temp 98.6°F | Ht 62.0 in | Wt 157.0 lb

## 2014-06-19 DIAGNOSIS — N6310 Unspecified lump in the right breast, unspecified quadrant: Secondary | ICD-10-CM

## 2014-06-19 DIAGNOSIS — N63 Unspecified lump in breast: Secondary | ICD-10-CM

## 2014-06-19 NOTE — Progress Notes (Signed)
   Subjective:    Patient ID: [name] [name], female    DOB: 07/03/1981, 33 y.o.   MRN: [id number]  HPIFollow up on right breast mass. Ultrasound and mammogram is scheduled for tomorrow. Pt finished antibiotitics.doxy.  She relates ongoing pain and discomfort tenderness. She denies nausea vomiting or other problems.  Review of Systems She relates pain discomfort denies any drainage    Objective:   Physical Exam Breasts exam was completed but she does have tenderness along the right lateral aspect that has moderate findings of tenderness in possible lump. No sign of infection.       Assessment & Plan:  Await the mammogram and ultrasound I believe this patient would be best served by being seen by a breast surgeon await the results of the mammogram and ultrasound

## 2014-06-20 ENCOUNTER — Encounter (HOSPITAL_COMMUNITY): Payer: BC Managed Care – PPO

## 2014-06-20 ENCOUNTER — Ambulatory Visit (HOSPITAL_COMMUNITY)
Admission: RE | Admit: 2014-06-20 | Discharge: 2014-06-20 | Disposition: A | Discharge status: Home / Self Care | Payer: BC Managed Care – PPO | Source: Ambulatory Visit | Attending: Family Medicine | Admitting: Family Medicine

## 2014-06-20 DIAGNOSIS — N63 Unspecified lump in breast: Secondary | ICD-10-CM | POA: Insufficient documentation

## 2014-06-20 IMAGING — US US BREAST COMPLETE UNI RIGHT INC AXILLA
1 series · 4 of 4 positions shown · non-contrast
Comparison: None.

CLINICAL DATA: 33-year-old female complaining of a palpable
abnormality in the right breast.

EXAM:
DIGITAL DIAGNOSTIC  BILATERAL MAMMOGRAM WITH CAD
ULTRASOUND RIGHT BREAST

[Series 1: us breast complete uni right inc axilla · 0.04mm/px · 4 of 4 slices shown]
[im 1/4]
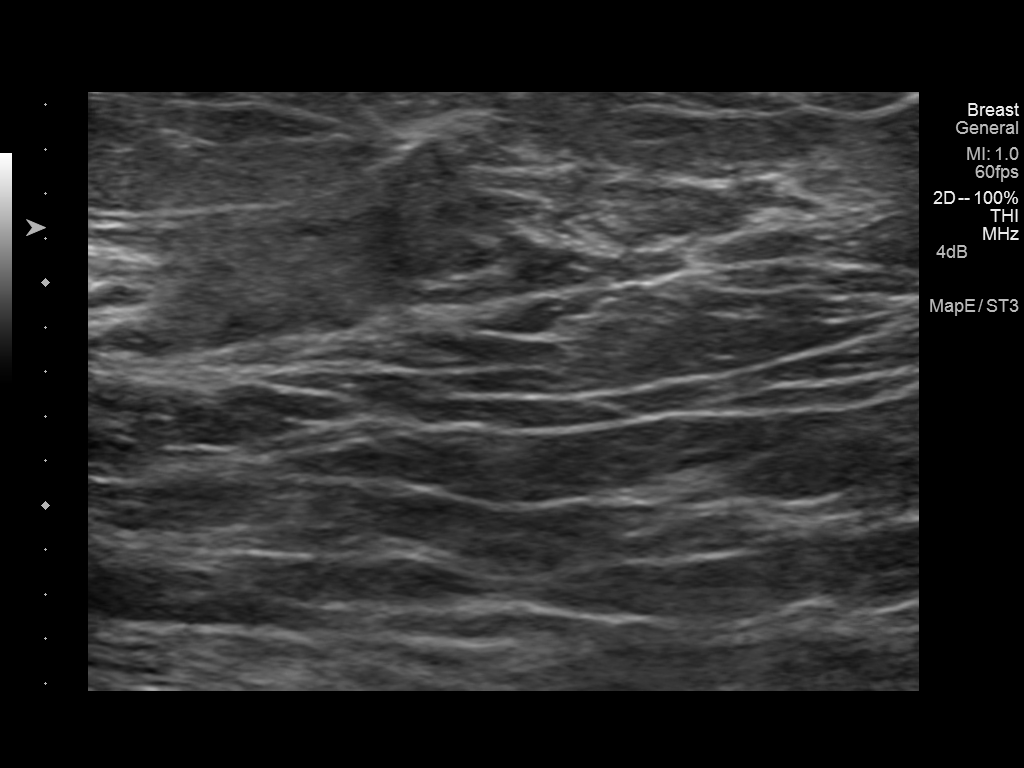
[im 2/4]
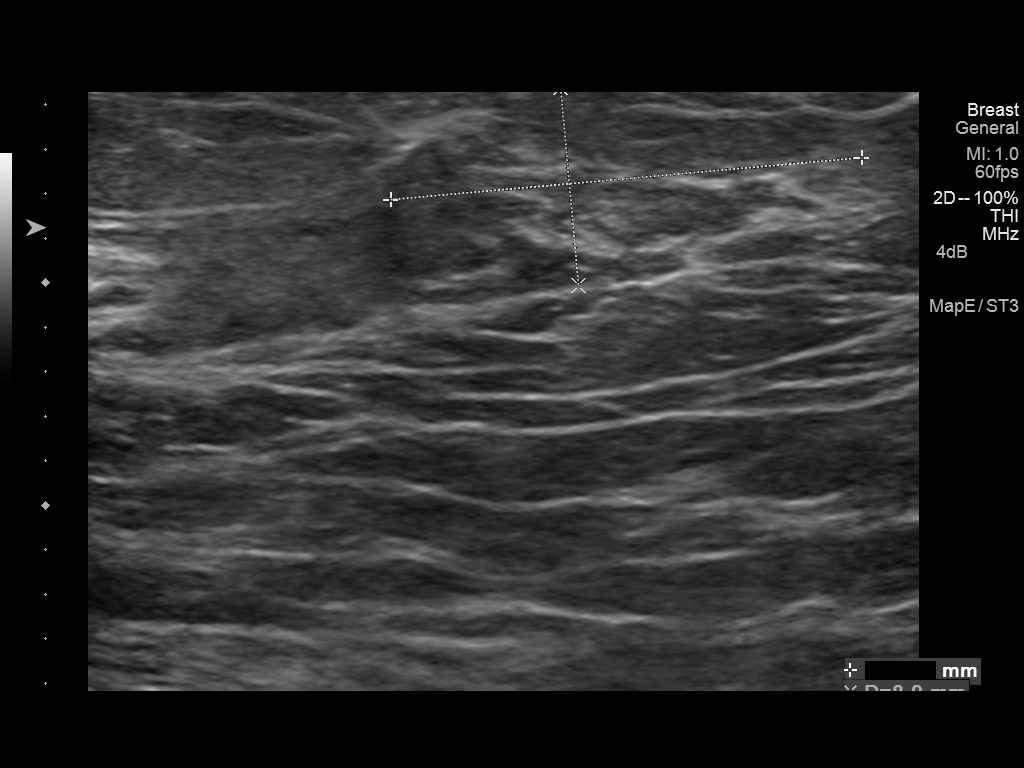
[im 3/4]
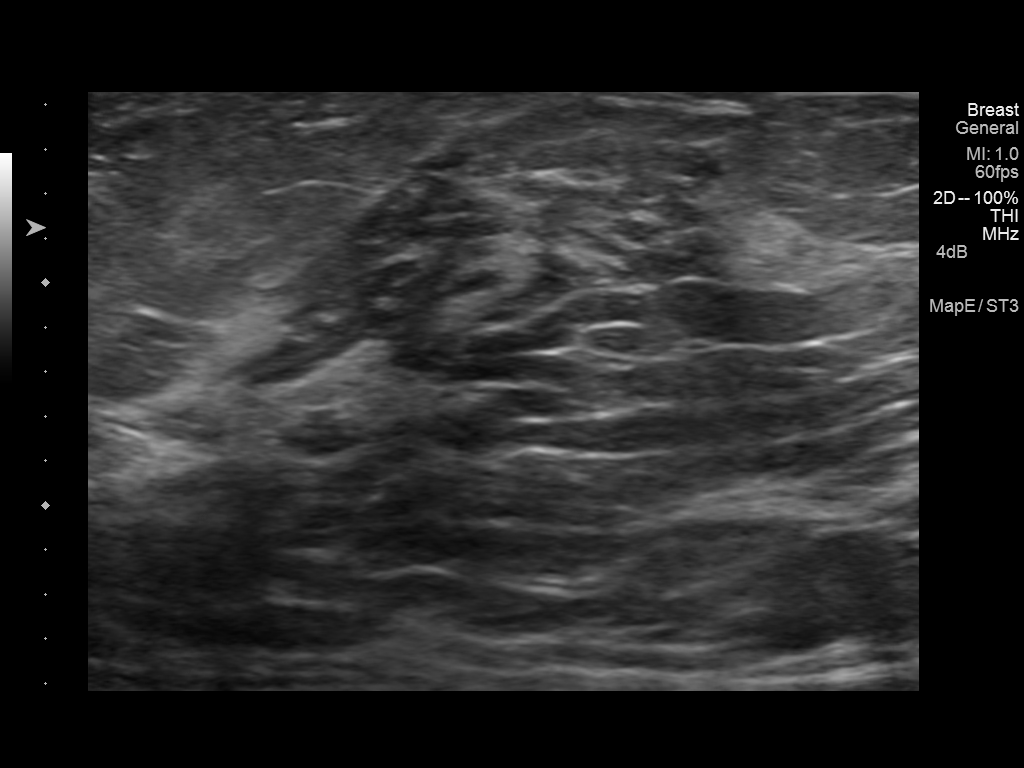
[im 4/4]
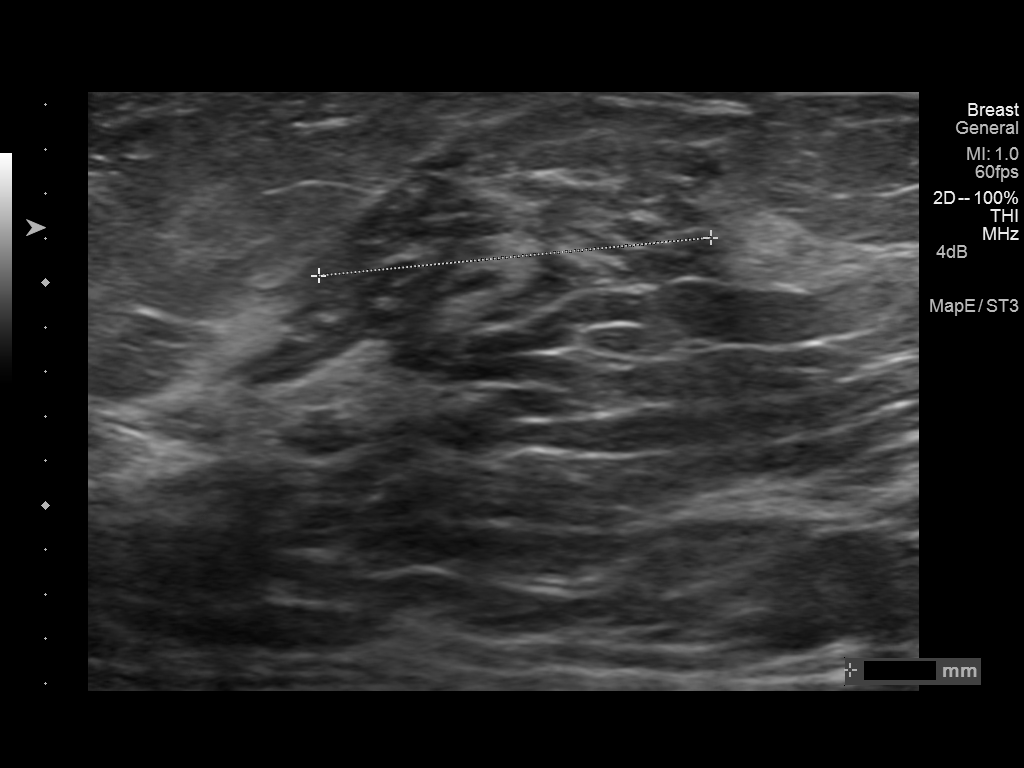

[4 of 4 positions shown; findings below may reference images not displayed]

ACR Breast Density Category b: There are scattered areas of
fibroglandular density.
FINDINGS: A radiopaque BB marks the palpable abnormality in the upper-outer
quadrant of the right breast. In the upper-outer quadrant of the
breast there is a 3.6 x 2.7 x 2.0 cm lesion with internal fat. It is
thought to be a hamartoma/fibroadenolipoma. There are no malignant
type microcalcifications. The left breast is negative.

Mammographic images were processed with CAD.

On physical exam, I palpate mild thickening in the left breast fat
10 o'clock 6 cm from the nipple.

Ultrasound is performed, showing a mixed echogenic lesion with
internal fat measuring 2.1 x 0.9 x 1.8 cm. It has the appearance of
a fibroadenolipoma.
IMPRESSION: Probable fibroadenolipoma in the right breast.

RECOMMENDATION:
Short-term interval followup right mammogram and possible ultrasound
in 6 months is recommended. The importance of self-breast
examination was discussed with the patient.

I have discussed the findings and recommendations with the patient.
Results were also provided in writing at the conclusion of the
visit. If applicable, a reminder letter will be sent to the patient
regarding the next appointment.

BI-RADS CATEGORY  3: Probably benign.

## 2014-06-20 IMAGING — MG MM DIGITAL DIAGNOSTIC BILAT
8 of 10 series · 8 of 10 positions shown · non-contrast
Comparison: None.

CLINICAL DATA: 33-year-old female complaining of a palpable
abnormality in the right breast.

EXAM:
DIGITAL DIAGNOSTIC  BILATERAL MAMMOGRAM WITH CAD
ULTRASOUND RIGHT BREAST

[L CC]
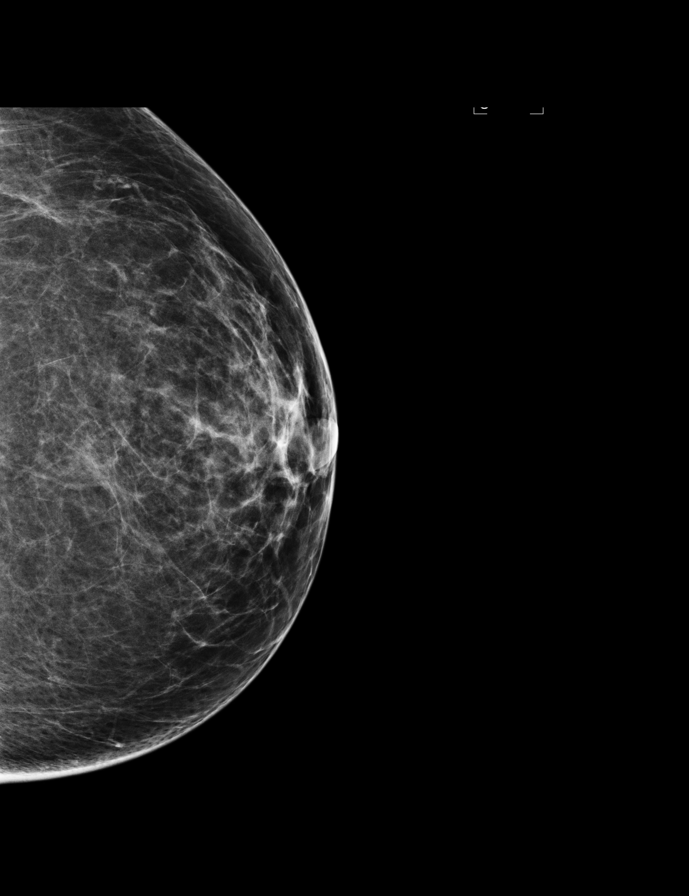

[L MLO]
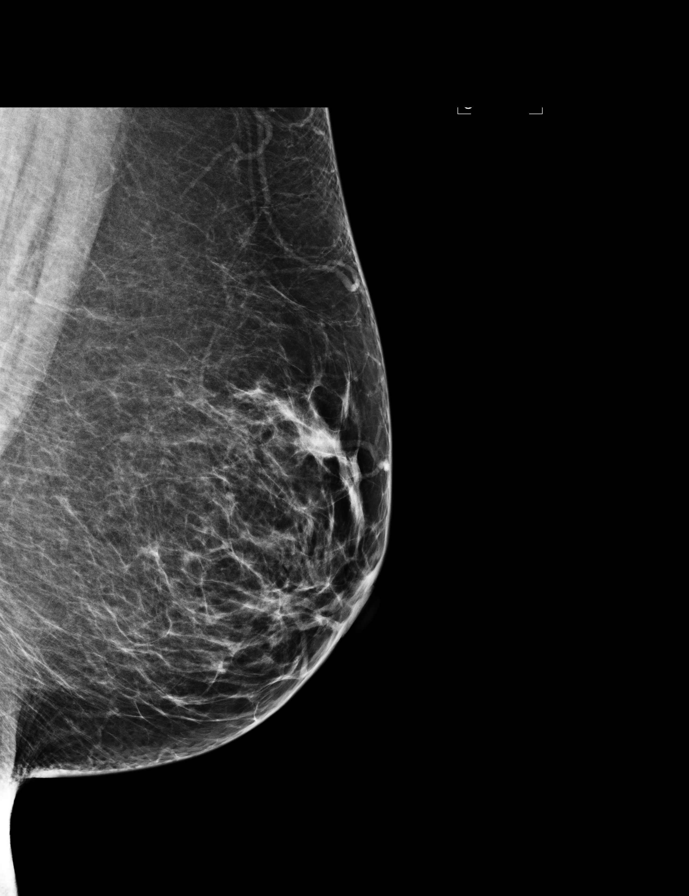

[R CC (1 of 3)]
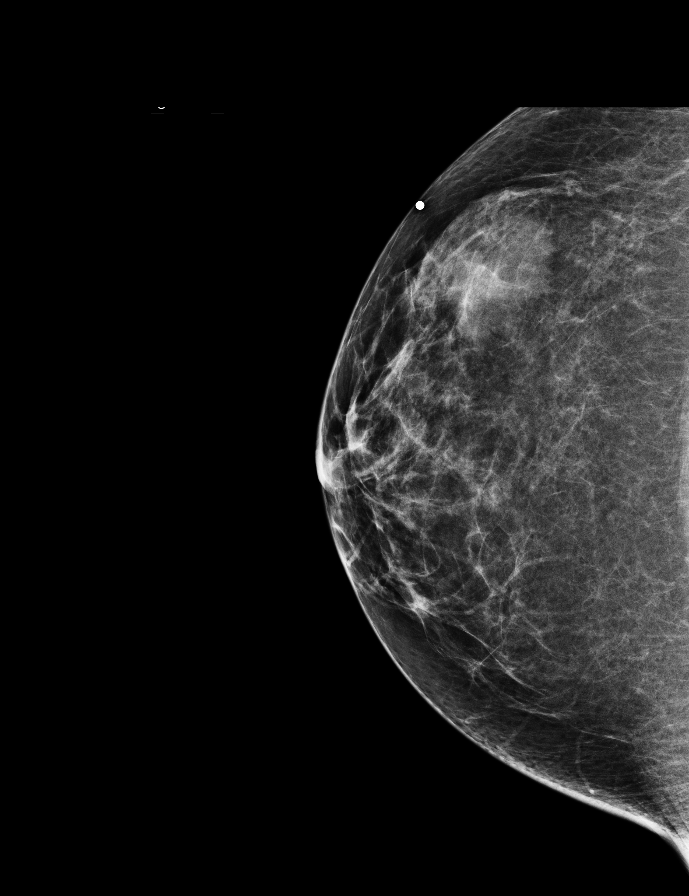

[R MLO]
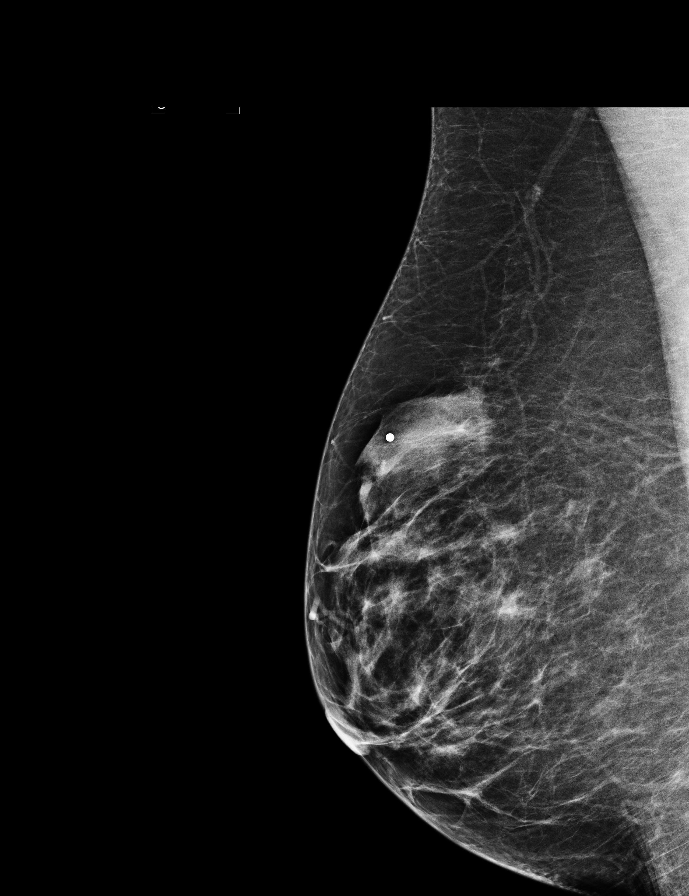

[R TAN]
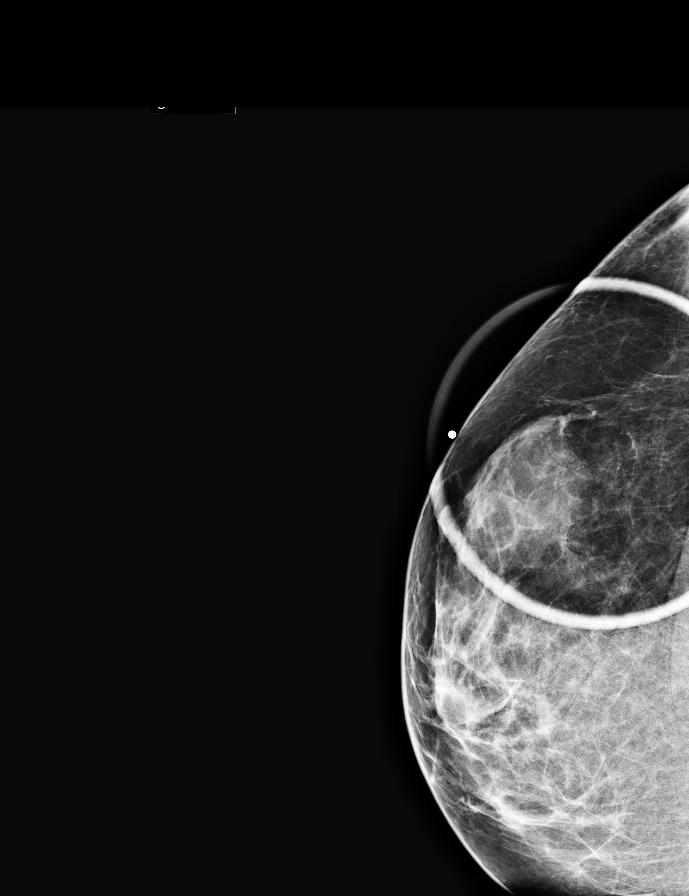

[R CC (2 of 3)]
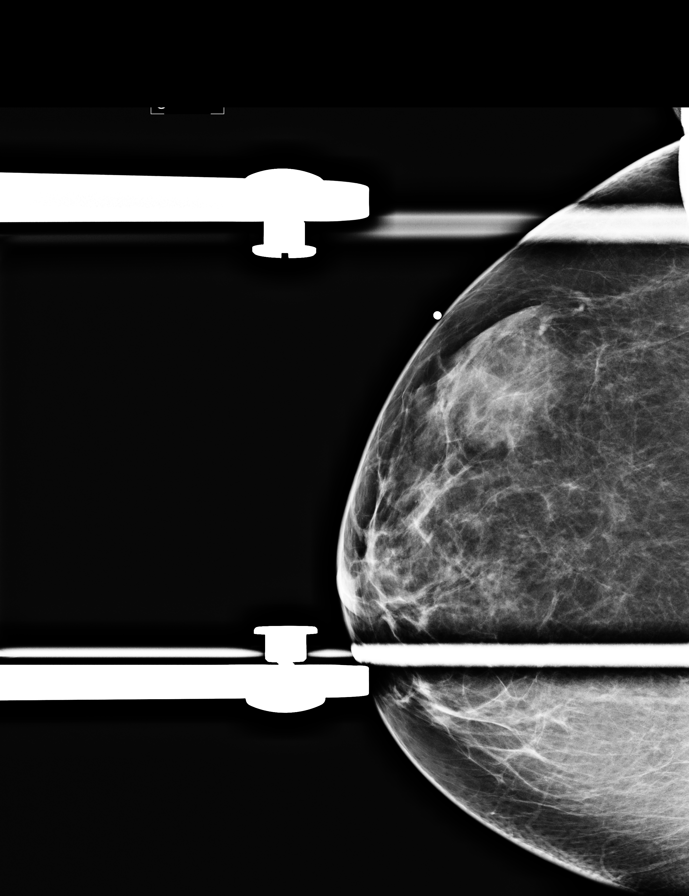

[R ML]
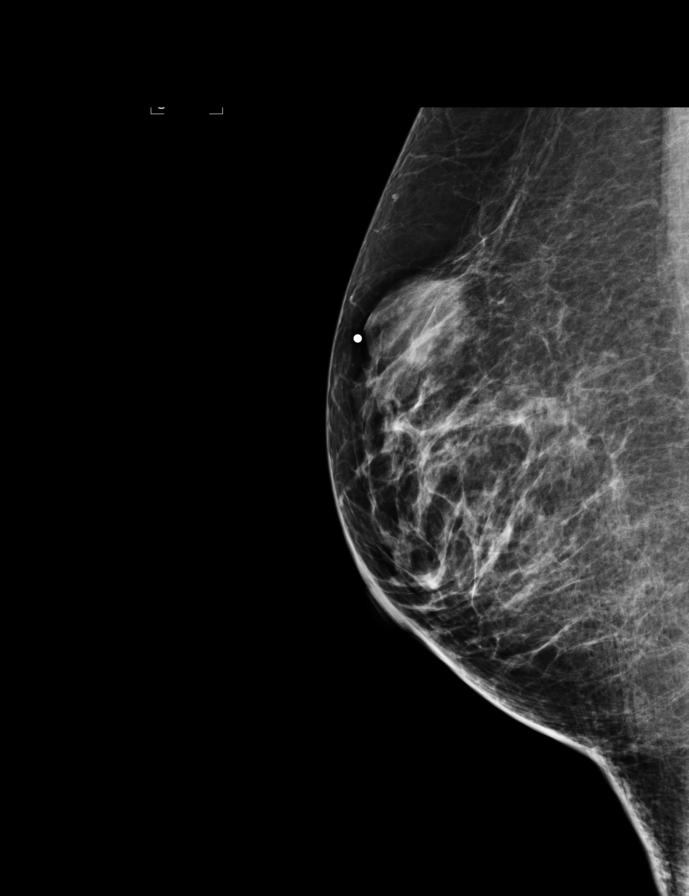

[R CC (3 of 3)]
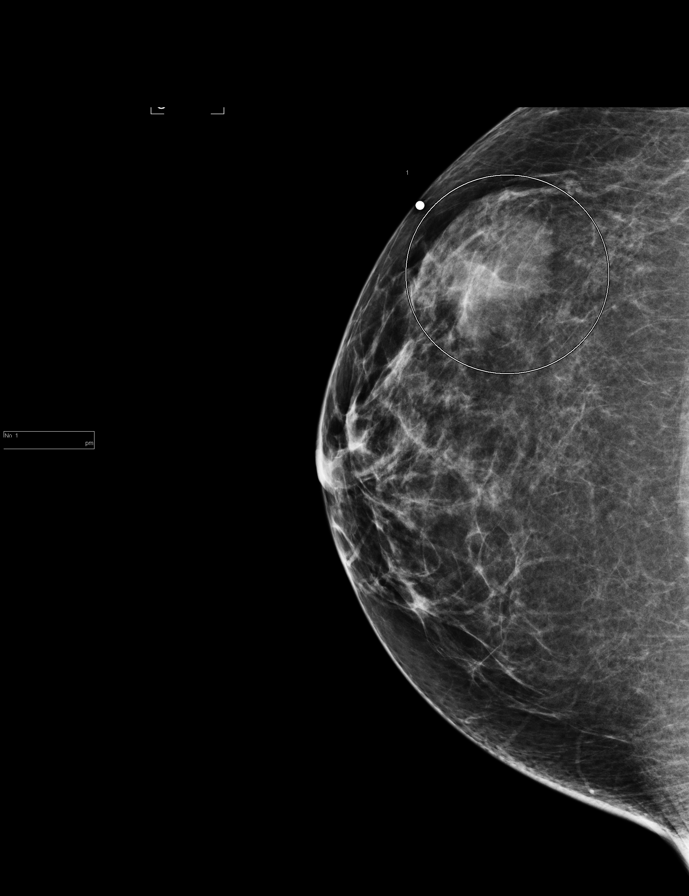

[8 of 10 positions shown; findings below may reference images not displayed]

ACR Breast Density Category b: There are scattered areas of
fibroglandular density.
FINDINGS: A radiopaque BB marks the palpable abnormality in the upper-outer
quadrant of the right breast. In the upper-outer quadrant of the
breast there is a 3.6 x 2.7 x 2.0 cm lesion with internal fat. It is
thought to be a hamartoma/fibroadenolipoma. There are no malignant
type microcalcifications. The left breast is negative.

Mammographic images were processed with CAD.

On physical exam, I palpate mild thickening in the left breast fat
10 o'clock 6 cm from the nipple.

Ultrasound is performed, showing a mixed echogenic lesion with
internal fat measuring 2.1 x 0.9 x 1.8 cm. It has the appearance of
a fibroadenolipoma.
IMPRESSION: Probable fibroadenolipoma in the right breast.

RECOMMENDATION:
Short-term interval followup right mammogram and possible ultrasound
in 6 months is recommended. The importance of self-breast
examination was discussed with the patient.

I have discussed the findings and recommendations with the patient.
Results were also provided in writing at the conclusion of the
visit. If applicable, a reminder letter will be sent to the patient
regarding the next appointment.

BI-RADS CATEGORY  3: Probably benign.

## 2014-06-23 ENCOUNTER — Other Ambulatory Visit: Payer: Self-pay | Admitting: Family Medicine

## 2014-06-23 DIAGNOSIS — N63 Unspecified lump in unspecified breast: Secondary | ICD-10-CM

## 2014-06-26 ENCOUNTER — Encounter: Payer: Self-pay | Admitting: Family Medicine

## 2014-06-26 ENCOUNTER — Other Ambulatory Visit: Payer: BC Managed Care – PPO | Admitting: Obstetrics & Gynecology

## 2014-06-27 ENCOUNTER — Ambulatory Visit (INDEPENDENT_AMBULATORY_CARE_PROVIDER_SITE_OTHER): Payer: BC Managed Care – PPO | Admitting: Obstetrics & Gynecology

## 2014-06-27 ENCOUNTER — Encounter: Payer: Self-pay | Admitting: Obstetrics & Gynecology

## 2014-06-27 ENCOUNTER — Other Ambulatory Visit (HOSPITAL_COMMUNITY)
Admission: RE | Admit: 2014-06-27 | Discharge: 2014-06-27 | Disposition: A | Discharge status: Home / Self Care | Payer: BC Managed Care – PPO | Source: Ambulatory Visit | Attending: Obstetrics & Gynecology | Admitting: Obstetrics & Gynecology

## 2014-06-27 VITALS — BP 110/80 | Ht 62.0 in | Wt 157.0 lb

## 2014-06-27 DIAGNOSIS — Z01419 Encounter for gynecological examination (general) (routine) without abnormal findings: Secondary | ICD-10-CM | POA: Insufficient documentation

## 2014-06-27 DIAGNOSIS — B3731 Acute candidiasis of vulva and vagina: Secondary | ICD-10-CM

## 2014-06-27 DIAGNOSIS — Z1151 Encounter for screening for human papillomavirus (HPV): Secondary | ICD-10-CM | POA: Diagnosis present

## 2014-06-27 DIAGNOSIS — B373 Candidiasis of vulva and vagina: Secondary | ICD-10-CM

## 2014-06-27 MED ORDER — FLUCONAZOLE 150 MG PO TABS: 150.0000 mg | ORAL_TABLET | Freq: Once | ORAL | Status: DC

## 2014-06-27 NOTE — Progress Notes (Signed)
Patient ID: [name] [name], female   DOB: 1980/12/31, 33 y.o.   MRN: [id number] Subjective:     [name] [name] is a 33 y.o. female here for a routine exam.  Patient's last menstrual period was 06/15/2014. G1P1001 Birth Control Method:  BTL Menstrual Calendar(currently): regular  Current complaints: right breast tender.   Current acute medical issues:  none   Recent Gynecologic History Patient's last menstrual period was 06/15/2014. Last Pap: 0214,  normal Last mammogram: 06/2104,  abnormal  Past Medical History  Diagnosis Date  . Migraine   . Colitis   . GERD (gastroesophageal reflux disease)   . Insomnia   . 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 Fibromyalgia   . Heart murmur   . Abnormal Pap smear     Multiple  . Abnormal uterine bleeding (AUB) 05/11/2013  . BV (bacterial vaginosis) 05/11/2013    Past Surgical History  Procedure Laterality Date  . Wisdom tooth extraction    . Colposcopy      multiple  . Laparoscopic tubal ligation Bilateral 06/15/2013    Procedure: LAPAROSCOPIC BILATERAL TUBAL LIGATION WITH CAUTERY;  Surgeon: Florian Buff, MD;  Location: [facility];  Service: Gynecology;  Laterality: Bilateral;    OB History   Grav Para Term Preterm Abortions TAB SAB Ect Mult Living   1 1 1       1       History   Social History  . Marital Status: Single    Spouse Name: N/A    Number of Children: 1  . Years of Education: N/A   Occupational History  . TRAVEL    Social History Main Topics  . Smoking status: Former Smoker -- 0.25 packs/day for 4 years    Types: Cigarettes    Quit date: 12/02/2007  . Smokeless tobacco: Never Used  . Alcohol Use: No  . Drug Use: No  . Sexual Activity: Yes    Birth Control/ Protection: None   Other Topics Concern  . None   Social History Narrative  . None    Family History  Problem Relation Age of Onset  . COPD Father   . Diabetes Father   . Heart disease Father   . Diverticulosis Father    . Diabetes Sister   . Leukemia Sister   . Diabetes Other   . Cancer Other   . Thyroid disease Other     Current outpatient prescriptions:ALPRAZolam (XANAX) 0.5 MG tablet, TAKE ONE to two TABLETs  BY MOUTH AT BEDTIME AS NEEDED, Disp: 60 tablet, Rfl: 1;  HYDROcodone-acetaminophen (NORCO/VICODIN) 5-325 MG per tablet, Take 1 tablet by mouth every 4 (four) hours as needed., Disp: 28 tablet, Rfl: 0;  ondansetron (ZOFRAN) 8 MG tablet, Take 1 tablet (8 mg total) by mouth every 8 (eight) hours as needed for nausea., Disp: 12 tablet, Rfl: 0 rizatriptan (MAXALT-MLT) 10 MG disintegrating tablet, Take 1 tablet (10 mg total) by mouth as needed for migraine. May repeat in 2 hours if needed, Disp: 10 tablet, Rfl: 2  Review of Systems  Review of Systems  Constitutional: Negative for fever, chills, weight loss, malaise/fatigue and diaphoresis.  HENT: Negative for hearing loss, ear pain, nosebleeds, congestion, sore throat, neck pain, tinnitus and ear discharge.   Eyes: Negative for blurred vision, double vision, photophobia, pain, discharge and redness.  Respiratory: Negative for cough, hemoptysis, sputum production, shortness of breath, wheezing and stridor.   Cardiovascular: Negative for chest pain, palpitations, orthopnea, claudication,  leg swelling and PND.  Gastrointestinal: negative for abdominal pain. Negative for heartburn, nausea, vomiting, diarrhea, constipation, blood in stool and melena.  Genitourinary: Negative for dysuria, urgency, frequency, hematuria and flank pain.  Musculoskeletal: Negative for myalgias, back pain, joint pain and falls.  Skin: Negative for itching and rash.  Neurological: Negative for dizziness, tingling, tremors, sensory change, speech change, focal weakness, seizures, loss of consciousness, weakness and headaches.  Endo/Heme/Allergies: Negative for environmental allergies and polydipsia. Does not bruise/bleed easily.  Psychiatric/Behavioral: Negative for depression,  suicidal ideas, hallucinations, memory loss and substance abuse. The patient is not nervous/anxious and does not have insomnia.        Objective:  Blood pressure 110/80, height 5\' 2"  (1.575 m), weight 157 lb (71.215 kg), last menstrual period 06/15/2014.   Physical Exam  Vitals reviewed. Constitutional: She is oriented to person, place, and time. She appears well-developed and well-nourished.  HENT:  Head: Normocephalic and atraumatic.        Right Ear: External ear normal.  Left Ear: External ear normal.  Nose: Nose normal.  Mouth/Throat: Oropharynx is clear and moist.  Eyes: Conjunctivae and EOM are normal. Pupils are equal, round, and reactive to light. Right eye exhibits no discharge. Left eye exhibits no discharge. No scleral icterus.  Neck: Normal range of motion. Neck supple. No tracheal deviation present. No thyromegaly present.  Cardiovascular: Normal rate, regular rhythm, normal heart sounds and intact distal pulses.  Exam reveals no gallop and no friction rub.   No murmur heard. Respiratory: Effort normal and breath sounds normal. No respiratory distress. She has no wheezes. She has no rales. She exhibits no tenderness.  GI: Soft. Bowel sounds are normal. She exhibits no distension and no mass. There is no tenderness. There is no rebound and no guarding.  Genitourinary:  Breasts no masses skin changes or nipple changes bilaterally      Vulva is normal without lesions Vagina is pink moist + yeast Cervix normal in appearance and pap is done Uterus is normal size shape and contour Adnexa is negative with normal sized ovaries   Musculoskeletal: Normal range of motion. She exhibits no edema and no tenderness.  Neurological: She is alert and oriented to person, place, and time. She has normal reflexes. She displays normal reflexes. No cranial nerve deficit. She exhibits normal muscle tone. Coordination normal.  Skin: Skin is warm and dry. No rash noted. No erythema. No pallor.   Psychiatric: She has a normal mood and affect. Her behavior is normal. Judgment and thought content normal.       Assessment:    Healthy female exam.    Plan:    Contraception: tubal ligation. Follow up in: 1 year.

## 2014-06-27 NOTE — Addendum Note (Signed)
Addended by: Doyne Keel on: 06/27/2014 04:51 PM   Modules accepted: Orders

## 2014-06-29 LAB — CYTOLOGY - PAP

## 2014-07-16 DIAGNOSIS — N6310 Unspecified lump in the right breast, unspecified quadrant: Secondary | ICD-10-CM

## 2014-07-16 HISTORY — DX: Unspecified lump in the right breast, unspecified quadrant: N63.10

## 2014-07-17 ENCOUNTER — Encounter: Payer: Self-pay | Admitting: Obstetrics & Gynecology

## 2014-07-17 ENCOUNTER — Other Ambulatory Visit (INDEPENDENT_AMBULATORY_CARE_PROVIDER_SITE_OTHER): Payer: Self-pay | Admitting: General Surgery

## 2014-07-24 ENCOUNTER — Other Ambulatory Visit (INDEPENDENT_AMBULATORY_CARE_PROVIDER_SITE_OTHER): Payer: Self-pay | Admitting: General Surgery

## 2014-07-24 DIAGNOSIS — R928 Other abnormal and inconclusive findings on diagnostic imaging of breast: Secondary | ICD-10-CM

## 2014-07-31 ENCOUNTER — Encounter (HOSPITAL_BASED_OUTPATIENT_CLINIC_OR_DEPARTMENT_OTHER): Payer: Self-pay | Admitting: *Deleted

## 2014-07-31 NOTE — Pre-Procedure Instructions (Signed)
To come for BMET and CBC, diff 

## 2014-08-01 ENCOUNTER — Ambulatory Visit
Admission: RE | Admit: 2014-08-01 | Discharge: 2014-08-01 | Disposition: A | Discharge status: Home / Self Care | Payer: BC Managed Care – PPO | Source: Ambulatory Visit | Attending: General Surgery | Admitting: General Surgery

## 2014-08-01 ENCOUNTER — Encounter (HOSPITAL_BASED_OUTPATIENT_CLINIC_OR_DEPARTMENT_OTHER)
Admission: RE | Admit: 2014-08-01 | Discharge: 2014-08-01 | Disposition: A | Discharge status: Home / Self Care | Payer: BC Managed Care – PPO | Source: Ambulatory Visit | Attending: General Surgery | Admitting: General Surgery

## 2014-08-01 DIAGNOSIS — R928 Other abnormal and inconclusive findings on diagnostic imaging of breast: Secondary | ICD-10-CM

## 2014-08-01 DIAGNOSIS — K219 Gastro-esophageal reflux disease without esophagitis: Secondary | ICD-10-CM | POA: Diagnosis not present

## 2014-08-01 DIAGNOSIS — D241 Benign neoplasm of right breast: Secondary | ICD-10-CM | POA: Diagnosis not present

## 2014-08-01 DIAGNOSIS — Z888 Allergy status to other drugs, medicaments and biological substances status: Secondary | ICD-10-CM | POA: Diagnosis not present

## 2014-08-01 DIAGNOSIS — Z8249 Family history of ischemic heart disease and other diseases of the circulatory system: Secondary | ICD-10-CM | POA: Diagnosis not present

## 2014-08-01 DIAGNOSIS — G43909 Migraine, unspecified, not intractable, without status migrainosus: Secondary | ICD-10-CM | POA: Diagnosis not present

## 2014-08-01 DIAGNOSIS — R011 Cardiac murmur, unspecified: Secondary | ICD-10-CM | POA: Diagnosis not present

## 2014-08-01 DIAGNOSIS — K5190 Ulcerative colitis, unspecified, without complications: Secondary | ICD-10-CM | POA: Diagnosis not present

## 2014-08-01 DIAGNOSIS — Z836 Family history of other diseases of the respiratory system: Secondary | ICD-10-CM | POA: Diagnosis not present

## 2014-08-01 DIAGNOSIS — Z800 Family history of malignant neoplasm of digestive organs: Secondary | ICD-10-CM | POA: Diagnosis not present

## 2014-08-01 DIAGNOSIS — Z833 Family history of diabetes mellitus: Secondary | ICD-10-CM | POA: Diagnosis not present

## 2014-08-01 DIAGNOSIS — N63 Unspecified lump in breast: Secondary | ICD-10-CM | POA: Diagnosis present

## 2014-08-01 DIAGNOSIS — Z87891 Personal history of nicotine dependence: Secondary | ICD-10-CM | POA: Diagnosis not present

## 2014-08-01 LAB — BASIC METABOLIC PANEL
Anion gap: 10 (ref 5–15)
BUN: 6 mg/dL (ref 6–23)
CHLORIDE: 101 meq/L (ref 96–112)
CO2: 26 mEq/L (ref 19–32)
Calcium: 9.4 mg/dL (ref 8.4–10.5)
Creatinine, Ser: 0.7 mg/dL (ref 0.50–1.10)
GFR calc non Af Amer: >90 mL/min (ref >90)
Glucose, Bld: 104 mg/dL — ABNORMAL HIGH (ref 70–99)
Potassium: 4.3 mEq/L (ref 3.7–5.3)
Sodium: 137 mEq/L (ref 137–147)

## 2014-08-01 LAB — CBC WITH DIFFERENTIAL/PLATELET
BASOS PCT: 0 % (ref 0–1)
Basophils Absolute: 0 10*3/uL (ref 0.0–0.1)
EOS ABS: 0 10*3/uL (ref 0.0–0.7)
EOS PCT: 0 % (ref 0–5)
HCT: 40 % (ref 36.0–46.0)
Hemoglobin: 13.8 g/dL (ref 12.0–15.0)
Lymphocytes Relative: 48 % — ABNORMAL HIGH (ref 12–46)
Lymphs Abs: 2.3 10*3/uL (ref 0.7–4.0)
MCH: 29.4 pg (ref 26.0–34.0)
MCHC: 34.5 g/dL (ref 30.0–36.0)
MCV: 85.1 fL (ref 78.0–100.0)
MONO ABS: 0.4 10*3/uL (ref 0.1–1.0)
MONOS PCT: 7 % (ref 3–12)
NEUTROS PCT: 45 % (ref 43–77)
Neutro Abs: 2.2 10*3/uL (ref 1.7–7.7)
Platelets: 201 10*3/uL (ref 150–400)
RBC: 4.7 MIL/uL (ref 3.87–5.11)
RDW: 12 % (ref 11.5–15.5)
WBC: 4.8 10*3/uL (ref 4.0–10.5)

## 2014-08-01 IMAGING — MG MM DIGITAL DIAGNOSTIC UNILAT*R*
2 series · 2 of 2 positions shown · non-contrast
Comparison: Previous exam(s)

CLINICAL DATA: Patient is a 33-year-old female who presents for
preoperative radioactive seed localization of a palpable mass in the
right breast.

EXAM:
ULTRASOUND GUIDED RADIOACTIVE SEED LOCALIZATION OF THE RIGHT BREAST

[R ML]
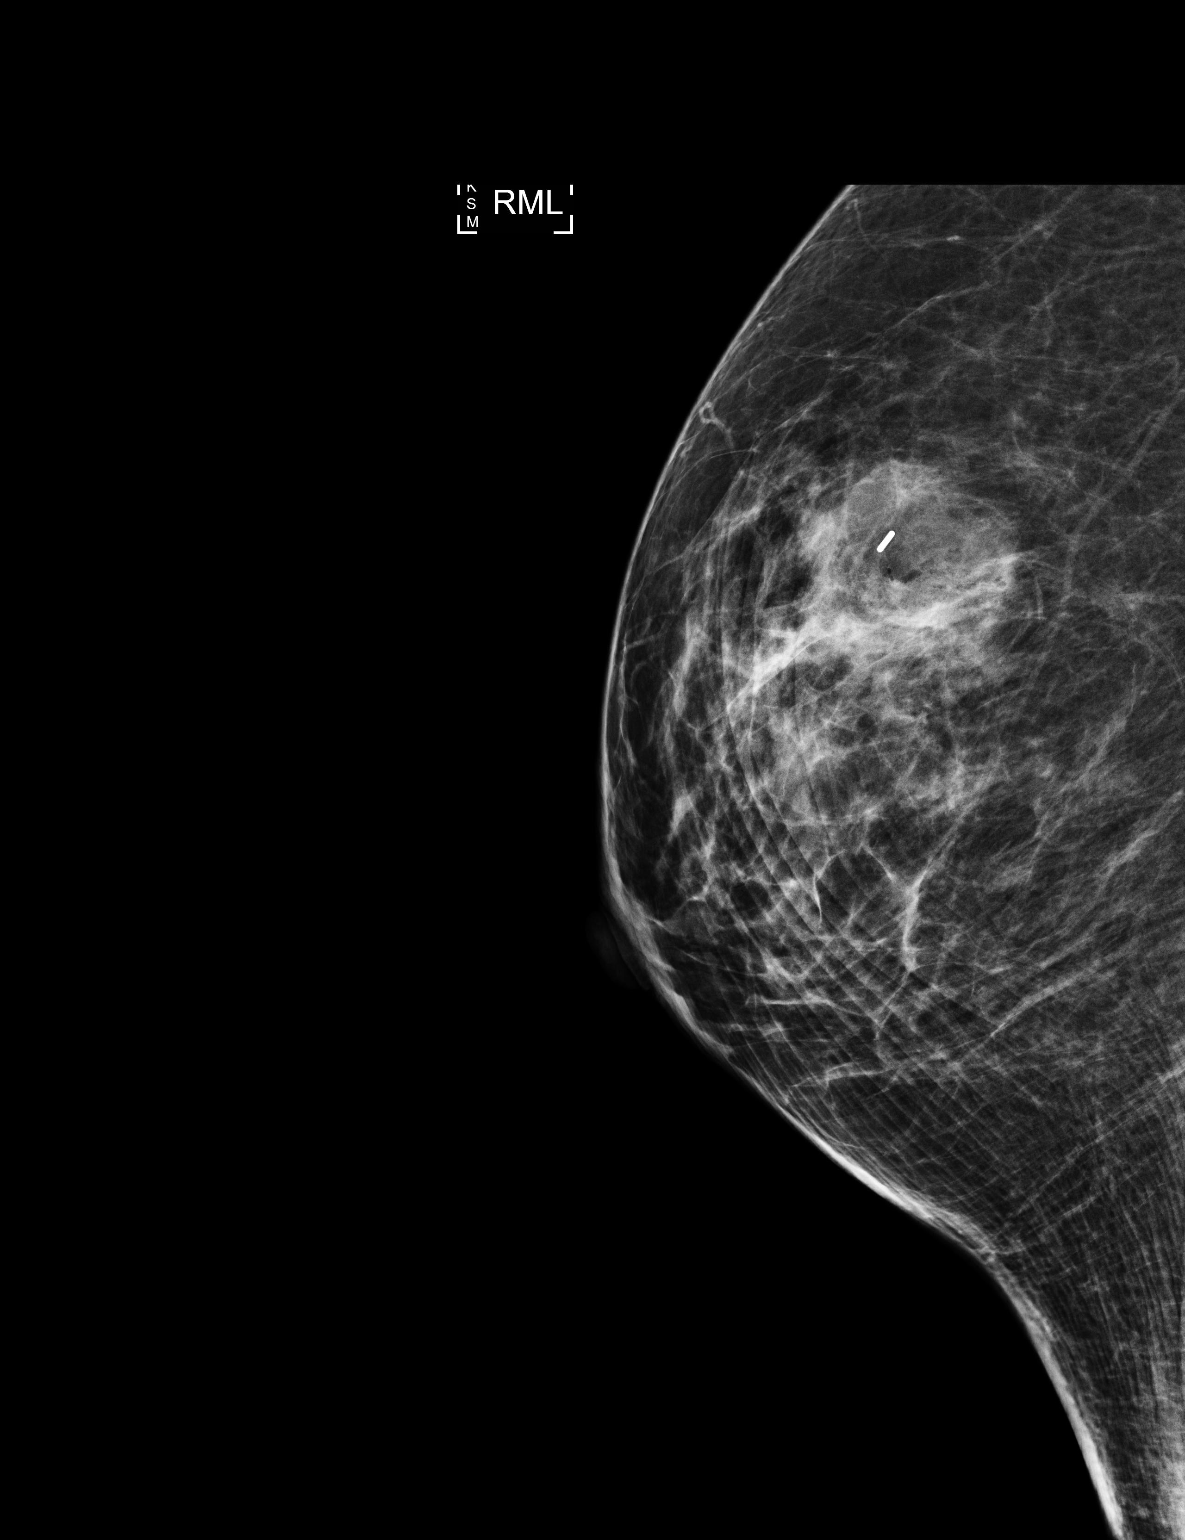

[R CC]
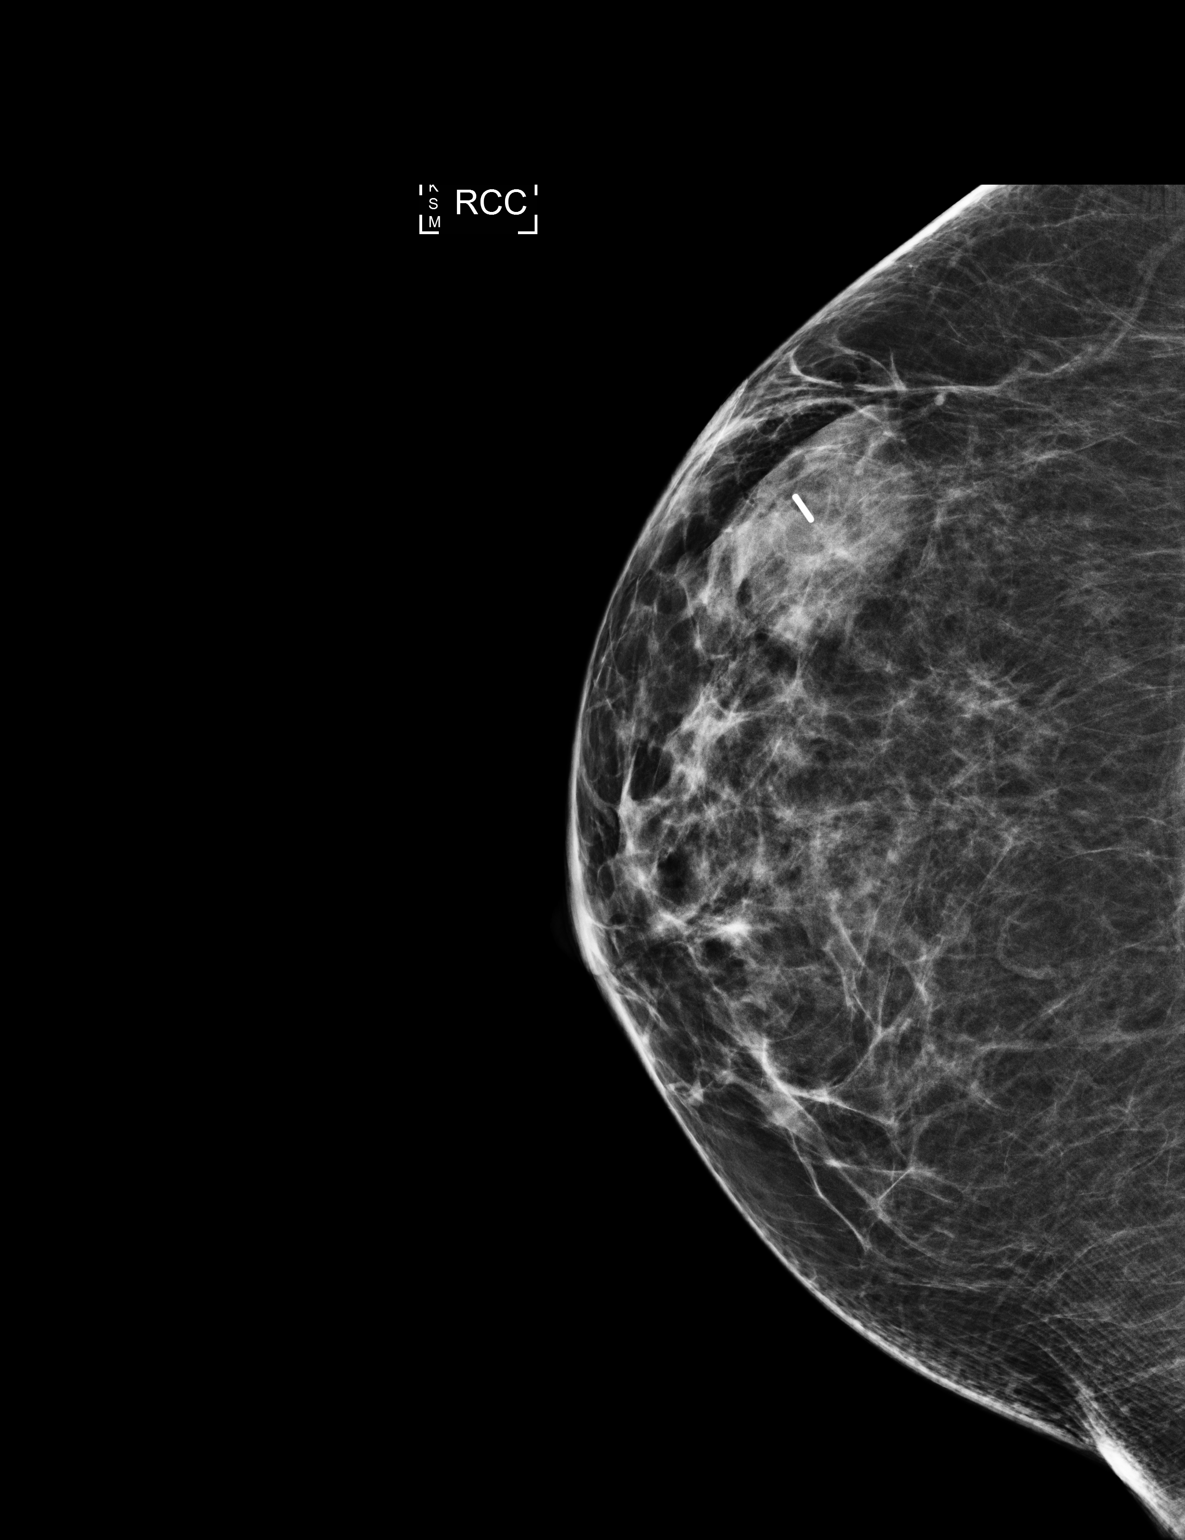

[2 of 2 positions shown; findings below may reference images not displayed]

FINDINGS: Patient presents for radioactive seed localization prior to surgical
excision of a palpable mass. I met with the patient and we discussed
the procedure of seed localization including benefits and
alternatives. We discussed the high likelihood of a successful
procedure. We discussed the risks of the procedure including
infection, bleeding, tissue injury and further surgery. We discussed
the low dose of radioactivity involved in the procedure. Informed,
written consent was given.

The usual time-out protocol was performed immediately prior to the
procedure.

Using ultrasound guidance, sterile technique, 2% lidocaine and an
[29] radioactive seed, the mass was localized using a lateral
approach. The follow-up mammogram images confirm the seed in the
expected location.

Follow-up survey of the patient confirms presence of radioactive
seed.

Order number of [29] seed:  [PHONE_NUMBER].

Dose of [29] seed:  0.246 mCi.

The patient tolerated the procedure well and was released from the
[REDACTED]. She was given instructions regarding seed removal.
IMPRESSION: Radioactive seed localization right breast. No apparent
complications.

## 2014-08-03 ENCOUNTER — Ambulatory Visit (HOSPITAL_BASED_OUTPATIENT_CLINIC_OR_DEPARTMENT_OTHER): Payer: BC Managed Care – PPO | Admitting: Anesthesiology

## 2014-08-03 ENCOUNTER — Ambulatory Visit (HOSPITAL_BASED_OUTPATIENT_CLINIC_OR_DEPARTMENT_OTHER)
Admission: RE | Admit: 2014-08-03 | Discharge: 2014-08-03 | Disposition: A | Discharge status: Home / Self Care | Payer: BC Managed Care – PPO | Source: Ambulatory Visit | Attending: General Surgery | Admitting: General Surgery

## 2014-08-03 ENCOUNTER — Encounter (HOSPITAL_BASED_OUTPATIENT_CLINIC_OR_DEPARTMENT_OTHER): Payer: Self-pay | Admitting: Anesthesiology

## 2014-08-03 ENCOUNTER — Encounter (HOSPITAL_BASED_OUTPATIENT_CLINIC_OR_DEPARTMENT_OTHER)
Admission: RE | Disposition: A | Discharge status: Home / Self Care | Payer: Self-pay | Source: Ambulatory Visit | Attending: General Surgery

## 2014-08-03 ENCOUNTER — Ambulatory Visit
Admission: RE | Admit: 2014-08-03 | Discharge: 2014-08-03 | Disposition: A | Discharge status: Home / Self Care | Payer: BC Managed Care – PPO | Source: Ambulatory Visit | Attending: General Surgery | Admitting: General Surgery

## 2014-08-03 DIAGNOSIS — G43909 Migraine, unspecified, not intractable, without status migrainosus: Secondary | ICD-10-CM | POA: Insufficient documentation

## 2014-08-03 DIAGNOSIS — Z8249 Family history of ischemic heart disease and other diseases of the circulatory system: Secondary | ICD-10-CM | POA: Insufficient documentation

## 2014-08-03 DIAGNOSIS — R011 Cardiac murmur, unspecified: Secondary | ICD-10-CM | POA: Insufficient documentation

## 2014-08-03 DIAGNOSIS — Z833 Family history of diabetes mellitus: Secondary | ICD-10-CM | POA: Insufficient documentation

## 2014-08-03 DIAGNOSIS — Z836 Family history of other diseases of the respiratory system: Secondary | ICD-10-CM | POA: Insufficient documentation

## 2014-08-03 DIAGNOSIS — K5190 Ulcerative colitis, unspecified, without complications: Secondary | ICD-10-CM | POA: Insufficient documentation

## 2014-08-03 DIAGNOSIS — Z87891 Personal history of nicotine dependence: Secondary | ICD-10-CM | POA: Insufficient documentation

## 2014-08-03 DIAGNOSIS — D241 Benign neoplasm of right breast: Secondary | ICD-10-CM | POA: Diagnosis not present

## 2014-08-03 DIAGNOSIS — R928 Other abnormal and inconclusive findings on diagnostic imaging of breast: Secondary | ICD-10-CM

## 2014-08-03 DIAGNOSIS — Z800 Family history of malignant neoplasm of digestive organs: Secondary | ICD-10-CM | POA: Insufficient documentation

## 2014-08-03 DIAGNOSIS — K219 Gastro-esophageal reflux disease without esophagitis: Secondary | ICD-10-CM | POA: Insufficient documentation

## 2014-08-03 DIAGNOSIS — Z888 Allergy status to other drugs, medicaments and biological substances status: Secondary | ICD-10-CM | POA: Insufficient documentation

## 2014-08-03 HISTORY — DX: Unspecified lump in the right breast, unspecified quadrant: N63.10

## 2014-08-03 HISTORY — PX: RADIOACTIVE SEED GUIDED EXCISIONAL BREAST BIOPSY: SHX6490

## 2014-08-03 IMAGING — MG MAMMOGRAPHY
1 series · 1 of 1 positions shown · non-contrast
Comparison: none

[R]
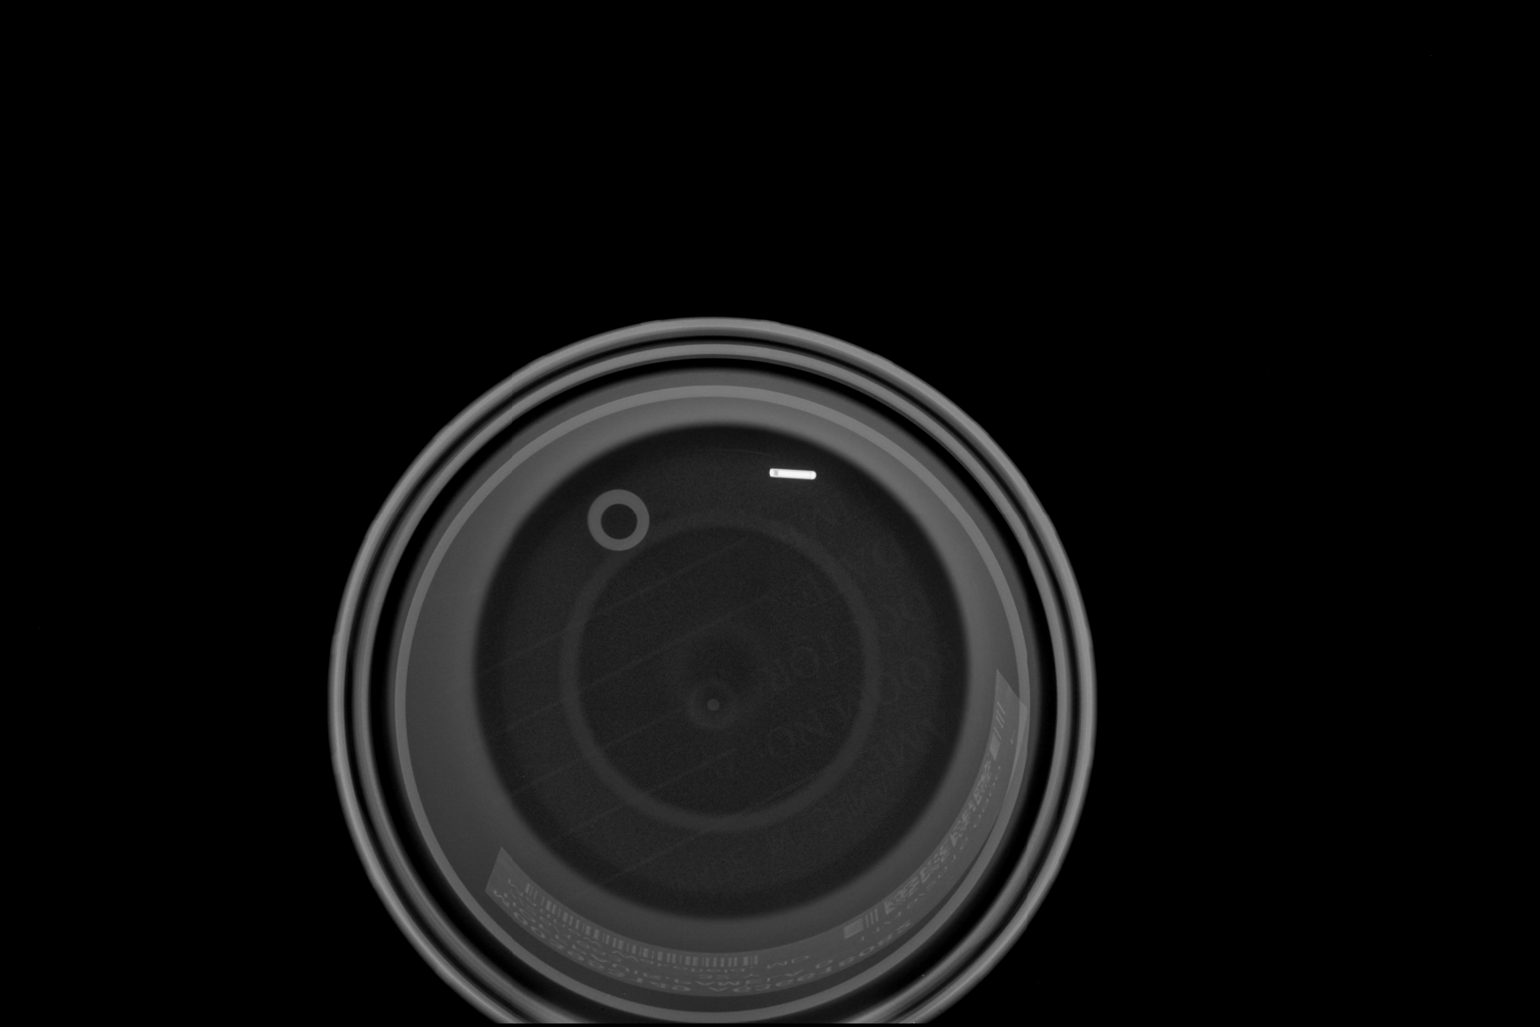

[1 of 1 positions shown; findings below may reference images not displayed]

OBSTETRICS REPORT
                      (Signed Final [DATE] [DATE])

Procedures

 US UA Cord Doppler                                    [0S]
 [HOSPITAL]                                         [0S]
Indications

 Pre-eclampsia
 Assess fetal well being
Fetal Evaluation

 Fetal Heart Rate:  151                          bpm
 Cardiac Activity:  Observed
 Presentation:      Frank breech
 Placenta:          Posterior, above cervical
                    os

 Amniotic Fluid
 AFI FV:      Subjectively low-normal
 AFI Sum:     10.39   cm       16  %Tile     Larg Pckt:     3.3  cm
 RUQ:   2.33    cm   RLQ:    2.15   cm    LUQ:   3.3     cm   LLQ:    2.61   cm
Biophysical Evaluation

 Amniotic F.V:   Within normal limits       F. Tone:        Observed
 F. Movement:    Observed                   Score:          [DATE]
 F. Breathing:   Observed
Gestational Age

 Best:          29w 3d     Det. By:  Previous Ultrasound      EDD:   [DATE]
Doppler - Fetal Vessels

 Umbilical Artery
 S/D:   2.41           24  %tile       RI:
                                       PSV:       -       cm/s


Cervix Uterus Adnexa

 Cervix:       Not visualized
Myomas

 Site                     L(cm)      W(cm)       D(cm)      Location
 Posterior
 Blood Flow                  RI       PI       Comments
                                               Central degeneration seen
Comments

 See inpatient consult note.
Impression

 Single living intrauterine pregnancy at 29 weeks 3 days.
 BPP [DATE].
 Normal umbilical artery dopplers.
 Breech presentation.
Recommendations

 Delivery via LTCS due to severe preeclampsia and breech
 presentation.

 questions or concerns.
                ARINOV

## 2014-08-03 IMAGING — MG MAMMOGRAPHY
1 series · 1 of 1 positions shown · non-contrast
Comparison: none

[R]
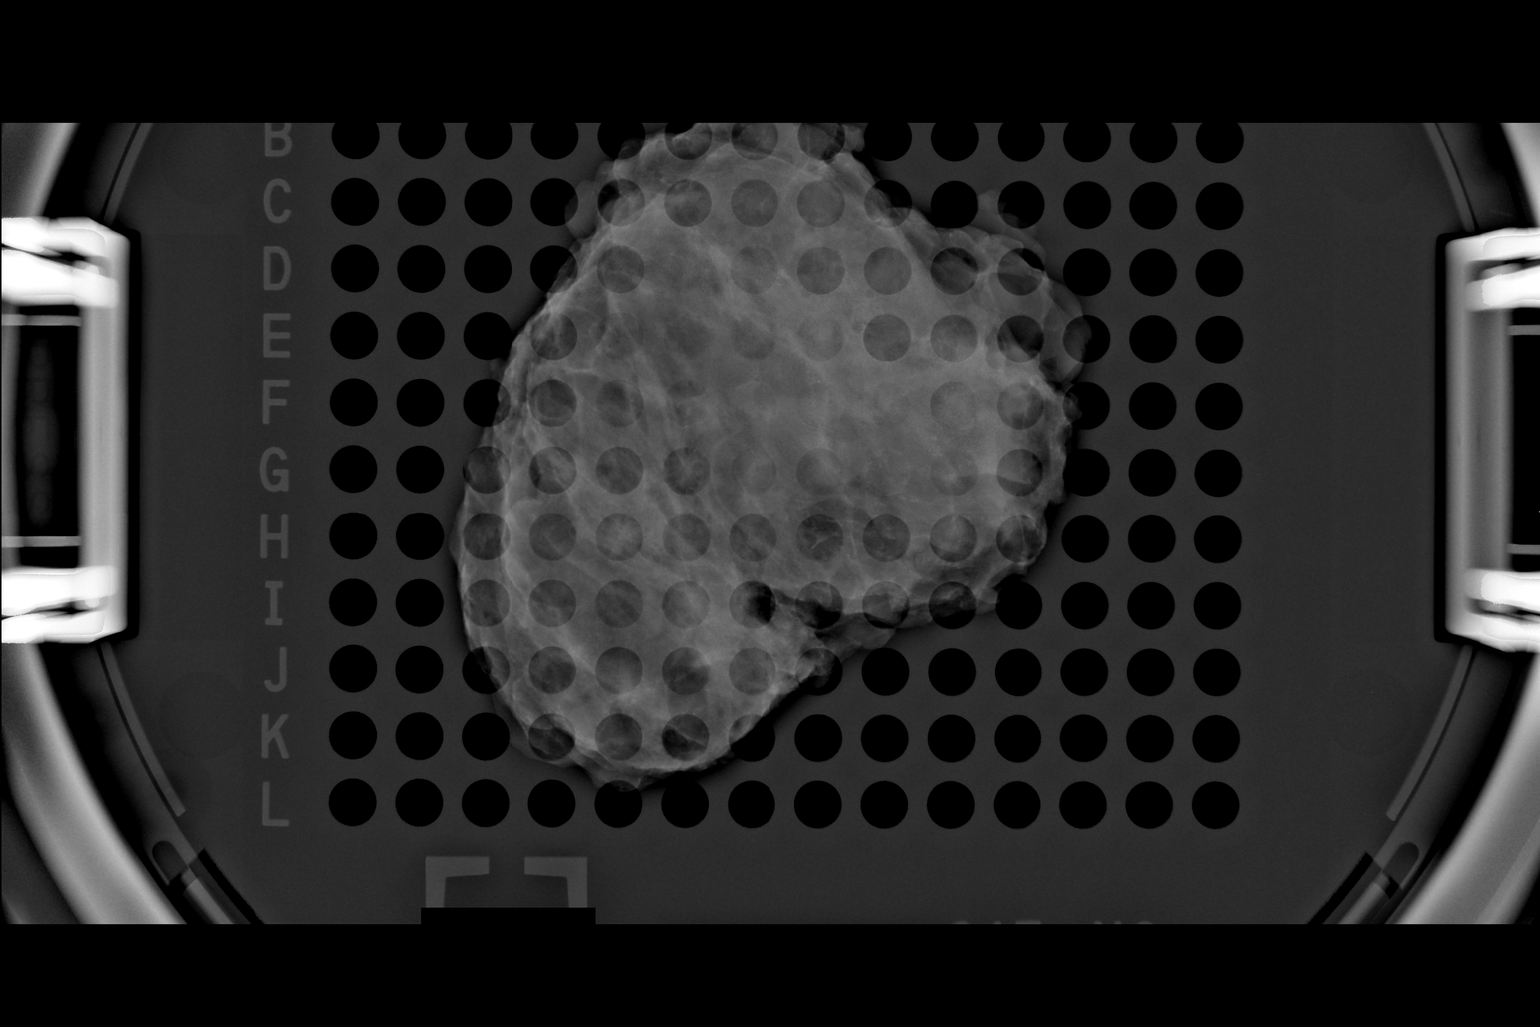

[1 of 1 positions shown; findings below may reference images not displayed]

OBSTETRICS REPORT
                      (Signed Final [DATE] [DATE])

Procedures

 US UA Cord Doppler                                    [0S]
 [HOSPITAL]                                         [0S]
Indications

 Pre-eclampsia
 Assess fetal well being
Fetal Evaluation

 Fetal Heart Rate:  151                          bpm
 Cardiac Activity:  Observed
 Presentation:      Frank breech
 Placenta:          Posterior, above cervical
                    os

 Amniotic Fluid
 AFI FV:      Subjectively low-normal
 AFI Sum:     10.39   cm       16  %Tile     Larg Pckt:     3.3  cm
 RUQ:   2.33    cm   RLQ:    2.15   cm    LUQ:   3.3     cm   LLQ:    2.61   cm
Biophysical Evaluation

 Amniotic F.V:   Within normal limits       F. Tone:        Observed
 F. Movement:    Observed                   Score:          [DATE]
 F. Breathing:   Observed
Gestational Age

 Best:          29w 3d     Det. By:  Previous Ultrasound      EDD:   [DATE]
Doppler - Fetal Vessels

 Umbilical Artery
 S/D:   2.41           24  %tile       RI:
                                       PSV:       -       cm/s


Cervix Uterus Adnexa

 Cervix:       Not visualized
Myomas

 Site                     L(cm)      W(cm)       D(cm)      Location
 Posterior
 Blood Flow                  RI       PI       Comments
                                               Central degeneration seen
Comments

 See inpatient consult note.
Impression

 Single living intrauterine pregnancy at 29 weeks 3 days.
 BPP [DATE].
 Normal umbilical artery dopplers.
 Breech presentation.
Recommendations

 Delivery via LTCS due to severe preeclampsia and breech
 presentation.

 questions or concerns.
                ARINOV

## 2014-08-03 SURGERY — RADIOACTIVE SEED GUIDED BREAST BIOPSY
Anesthesia: General | Site: Breast | Laterality: Right

## 2014-08-03 MED ORDER — OXYCODONE-ACETAMINOPHEN 10-325 MG PO TABS: 1.0000 | ORAL_TABLET | Freq: Four times a day (QID) | ORAL | Status: DC | PRN

## 2014-08-03 MED ORDER — ONDANSETRON HCL 4 MG/2ML IJ SOLN
INTRAMUSCULAR | Status: DC | PRN
  Administered 2014-08-03: 4 mg via INTRAVENOUS

## 2014-08-03 MED ORDER — MIDAZOLAM HCL 2 MG/ML PO SYRP: 12.0000 mg | ORAL_SOLUTION | Freq: Once | ORAL | Status: DC | PRN

## 2014-08-03 MED ORDER — FENTANYL CITRATE 0.05 MG/ML IJ SOLN: 50.0000 ug | INTRAMUSCULAR | Status: DC | PRN

## 2014-08-03 MED ORDER — VANCOMYCIN HCL IN DEXTROSE 1-5 GM/200ML-% IV SOLN
INTRAVENOUS | Status: AC
  Filled 2014-08-03: qty 200

## 2014-08-03 MED ORDER — OXYCODONE HCL 5 MG PO TABS
ORAL_TABLET | ORAL | Status: AC
  Filled 2014-08-03: qty 1

## 2014-08-03 MED ORDER — VANCOMYCIN HCL 1000 MG IV SOLR
1000.0000 mg | INTRAVENOUS | Status: DC | PRN
  Administered 2014-08-03: 1000 mg via INTRAVENOUS

## 2014-08-03 MED ORDER — HYDROMORPHONE HCL 1 MG/ML IJ SOLN
0.2500 mg | INTRAMUSCULAR | Status: DC | PRN
  Administered 2014-08-03: 0.5 mg via INTRAVENOUS

## 2014-08-03 MED ORDER — LACTATED RINGERS IV SOLN
INTRAVENOUS | Status: DC
  Administered 2014-08-03: 09:00:00 via INTRAVENOUS

## 2014-08-03 MED ORDER — SCOPOLAMINE 1 MG/3DAYS TD PT72
1.0000 | MEDICATED_PATCH | TRANSDERMAL | Status: DC
  Administered 2014-08-03: 1.5 mg via TRANSDERMAL

## 2014-08-03 MED ORDER — FENTANYL CITRATE 0.05 MG/ML IJ SOLN
INTRAMUSCULAR | Status: DC | PRN
  Administered 2014-08-03 (×2): 50 ug via INTRAVENOUS

## 2014-08-03 MED ORDER — ONDANSETRON HCL 4 MG/2ML IJ SOLN: 4.0000 mg | Freq: Once | INTRAMUSCULAR | Status: DC | PRN

## 2014-08-03 MED ORDER — FENTANYL CITRATE 0.05 MG/ML IJ SOLN
INTRAMUSCULAR | Status: AC
  Filled 2014-08-03: qty 6

## 2014-08-03 MED ORDER — PROPOFOL 10 MG/ML IV BOLUS
INTRAVENOUS | Status: DC | PRN
  Administered 2014-08-03: 200 mg via INTRAVENOUS

## 2014-08-03 MED ORDER — VANCOMYCIN HCL IN DEXTROSE 1-5 GM/200ML-% IV SOLN
1000.0000 mg | INTRAVENOUS | Status: AC
  Administered 2014-08-03: 1000 mg via INTRAVENOUS

## 2014-08-03 MED ORDER — MIDAZOLAM HCL 2 MG/2ML IJ SOLN
INTRAMUSCULAR | Status: AC
  Filled 2014-08-03: qty 2

## 2014-08-03 MED ORDER — BUPIVACAINE HCL (PF) 0.25 % IJ SOLN
INTRAMUSCULAR | Status: DC | PRN
  Administered 2014-08-03: 20 mL

## 2014-08-03 MED ORDER — LIDOCAINE HCL (CARDIAC) 20 MG/ML IV SOLN
INTRAVENOUS | Status: DC | PRN
  Administered 2014-08-03: 50 mg via INTRAVENOUS

## 2014-08-03 MED ORDER — OXYCODONE HCL 5 MG PO TABS
5.0000 mg | ORAL_TABLET | Freq: Once | ORAL | Status: AC | PRN
  Administered 2014-08-03: 5 mg via ORAL

## 2014-08-03 MED ORDER — MIDAZOLAM HCL 5 MG/5ML IJ SOLN
INTRAMUSCULAR | Status: DC | PRN
  Administered 2014-08-03: 2 mg via INTRAVENOUS

## 2014-08-03 MED ORDER — MIDAZOLAM HCL 2 MG/2ML IJ SOLN: 1.0000 mg | INTRAMUSCULAR | Status: DC | PRN

## 2014-08-03 MED ORDER — SCOPOLAMINE 1 MG/3DAYS TD PT72
MEDICATED_PATCH | TRANSDERMAL | Status: AC
  Filled 2014-08-03: qty 1

## 2014-08-03 MED ORDER — OXYCODONE HCL 5 MG/5ML PO SOLN: 5.0000 mg | Freq: Once | ORAL | Status: AC | PRN

## 2014-08-03 MED ORDER — HYDROMORPHONE HCL 1 MG/ML IJ SOLN
INTRAMUSCULAR | Status: AC
  Filled 2014-08-03: qty 1

## 2014-08-03 SURGICAL SUPPLY — 57 items
APPLIER CLIP 9.375 MED OPEN (MISCELLANEOUS)
BENZOIN TINCTURE PRP APPL 2/3 (GAUZE/BANDAGES/DRESSINGS) IMPLANT
BINDER BREAST LRG (GAUZE/BANDAGES/DRESSINGS) ×3 IMPLANT
BINDER BREAST MEDIUM (GAUZE/BANDAGES/DRESSINGS) IMPLANT
BINDER BREAST XLRG (GAUZE/BANDAGES/DRESSINGS) IMPLANT
BINDER BREAST XXLRG (GAUZE/BANDAGES/DRESSINGS) IMPLANT
BLADE SURG 15 STRL LF DISP TIS (BLADE) ×1 IMPLANT
BLADE SURG 15 STRL SS (BLADE) ×2
CANISTER SUC SOCK COL 7IN (MISCELLANEOUS) IMPLANT
CANISTER SUCT 1200ML W/VALVE (MISCELLANEOUS) IMPLANT
CHLORAPREP W/TINT 26ML (MISCELLANEOUS) ×3 IMPLANT
CLIP APPLIE 9.375 MED OPEN (MISCELLANEOUS) IMPLANT
CLOSURE WOUND 1/2 X4 (GAUZE/BANDAGES/DRESSINGS) ×1
COVER BACK TABLE 60X90IN (DRAPES) ×3 IMPLANT
COVER MAYO STAND STRL (DRAPES) ×3 IMPLANT
COVER PROBE W GEL 5X96 (DRAPES) ×3 IMPLANT
DECANTER SPIKE VIAL GLASS SM (MISCELLANEOUS) IMPLANT
DEVICE DUBIN W/COMP PLATE 8390 (MISCELLANEOUS) ×3 IMPLANT
DRAPE PED LAPAROTOMY (DRAPES) ×3 IMPLANT
DRSG TEGADERM 4X4.75 (GAUZE/BANDAGES/DRESSINGS) IMPLANT
ELECT COATED BLADE 2.86 ST (ELECTRODE) ×3 IMPLANT
ELECT REM PT RETURN 9FT ADLT (ELECTROSURGICAL) ×3
ELECTRODE REM PT RTRN 9FT ADLT (ELECTROSURGICAL) ×1 IMPLANT
GLOVE BIO SURGEON STRL SZ7 (GLOVE) ×6 IMPLANT
GLOVE BIOGEL PI IND STRL 6.5 (GLOVE) ×1 IMPLANT
GLOVE BIOGEL PI IND STRL 7.5 (GLOVE) ×1 IMPLANT
GLOVE BIOGEL PI INDICATOR 6.5 (GLOVE) ×2
GLOVE BIOGEL PI INDICATOR 7.5 (GLOVE) ×2
GLOVE ECLIPSE 6.5 STRL STRAW (GLOVE) ×3 IMPLANT
GOWN STRL REUS W/ TWL LRG LVL3 (GOWN DISPOSABLE) ×2 IMPLANT
GOWN STRL REUS W/TWL LRG LVL3 (GOWN DISPOSABLE) ×4
KIT MARKER MARGIN INK (KITS) ×3 IMPLANT
LIQUID BAND (GAUZE/BANDAGES/DRESSINGS) ×3 IMPLANT
NEEDLE HYPO 25X1 1.5 SAFETY (NEEDLE) ×3 IMPLANT
NS IRRIG 1000ML POUR BTL (IV SOLUTION) IMPLANT
PACK BASIN DAY SURGERY FS (CUSTOM PROCEDURE TRAY) ×3 IMPLANT
PENCIL BUTTON HOLSTER BLD 10FT (ELECTRODE) ×3 IMPLANT
SHEET MEDIUM DRAPE 40X70 STRL (DRAPES) IMPLANT
SLEEVE SCD COMPRESS KNEE MED (MISCELLANEOUS) ×3 IMPLANT
SPONGE GAUZE 4X4 12PLY STER LF (GAUZE/BANDAGES/DRESSINGS) IMPLANT
SPONGE LAP 4X18 X RAY DECT (DISPOSABLE) ×3 IMPLANT
STAPLER VISISTAT 35W (STAPLE) IMPLANT
STRIP CLOSURE SKIN 1/2X4 (GAUZE/BANDAGES/DRESSINGS) ×2 IMPLANT
SUT MNCRL AB 4-0 PS2 18 (SUTURE) ×3 IMPLANT
SUT MON AB 5-0 PS2 18 (SUTURE) IMPLANT
SUT SILK 2 0 SH (SUTURE) IMPLANT
SUT VIC AB 2-0 SH 27 (SUTURE) ×2
SUT VIC AB 2-0 SH 27XBRD (SUTURE) ×1 IMPLANT
SUT VIC AB 3-0 SH 27 (SUTURE) ×2
SUT VIC AB 3-0 SH 27X BRD (SUTURE) ×1 IMPLANT
SUT VIC AB 5-0 PS2 18 (SUTURE) IMPLANT
SYR CONTROL 10ML LL (SYRINGE) ×3 IMPLANT
TOWEL OR 17X24 6PK STRL BLUE (TOWEL DISPOSABLE) ×3 IMPLANT
TOWEL OR NON WOVEN STRL DISP B (DISPOSABLE) ×3 IMPLANT
TUBE CONNECTING 20'X1/4 (TUBING)
TUBE CONNECTING 20X1/4 (TUBING) IMPLANT
YANKAUER SUCT BULB TIP NO VENT (SUCTIONS) IMPLANT

## 2014-08-03 NOTE — Anesthesia Procedure Notes (Signed)
Procedure Name: LMA Insertion Date/Time: 08/03/2014 9:23 AM Performed by: Toula Moos L Pre-anesthesia Checklist: Patient identified, Emergency Drugs available, Suction available, Patient being monitored and Timeout performed Patient Re-evaluated:Patient Re-evaluated prior to inductionOxygen Delivery Method: Circle System Utilized Preoxygenation: Pre-oxygenation with 100% oxygen Intubation Type: IV induction Ventilation: Mask ventilation without difficulty LMA: LMA inserted LMA Size: 4.0 Number of attempts: 1 Airway Equipment and Method: bite block Placement Confirmation: positive ETCO2 Tube secured with: Tape Dental Injury: Teeth and Oropharynx as per pre-operative assessment

## 2014-08-03 NOTE — Transfer of Care (Signed)
Immediate Anesthesia Transfer of Care Note  Patient: [name] [name]  Procedure(s) Performed: Procedure(s): RIGHT BREAST SEED GUIDED EXCISION (Right)  Patient Location: PACU  Anesthesia Type:General  Level of Consciousness: awake, oriented and patient cooperative  Airway & Oxygen Therapy: Patient Spontanous Breathing and Patient connected to face mask oxygen  Post-op Assessment: Report given to PACU RN and Post -op Vital signs reviewed and stable  Post vital signs: Reviewed and stable  Complications: No apparent anesthesia complications

## 2014-08-03 NOTE — Interval H&P Note (Signed)
History and Physical Interval Note:  08/03/2014 8:42 AM  [name] [name]  has presented today for surgery, with the diagnosis of right breast mass  The various methods of treatment have been discussed with the patient and family. After consideration of risks, benefits and other options for treatment, the patient has consented to  Procedure(s): RIGHT BREAST SEED GUIDED EXCISION (Right) as a surgical intervention .  The patient's history has been reviewed, patient examined, no change in status, stable for surgery.  I have reviewed the patient's chart and labs.  Questions were answered to the patient's satisfaction.     Giovannina Mun

## 2014-08-03 NOTE — Discharge Instructions (Signed)
[facility] °Office Phone Number [phone] ° °BREAST BIOPSY/ PARTIAL MASTECTOMY: POST OP INSTRUCTIONS ° °Always review your discharge instruction sheet given to you by the facility where your surgery was performed. ° °IF YOU HAVE DISABILITY OR FAMILY LEAVE FORMS, YOU MUST BRING THEM TO THE OFFICE FOR PROCESSING.  DO NOT GIVE THEM TO YOUR DOCTOR. ° °1. A prescription for pain medication may be given to you upon discharge.  Take your pain medication as prescribed, if needed.  If narcotic pain medicine is not needed, then you may take acetaminophen (Tylenol), naprosyn (Alleve) or ibuprofen (Advil) as needed. °2. Take your usually prescribed medications unless otherwise directed °3. If you need a refill on your pain medication, please contact your pharmacy.  They will contact our office to request authorization.  Prescriptions will not be filled after 5pm or on week-ends. °4. You should eat very light the first 24 hours after surgery, such as soup, crackers, pudding, etc.  Resume your normal diet the day after surgery. °5. Most patients will experience some swelling and bruising in the breast.  Ice packs and a good support bra will help.  Wear the breast binder provided or a sports bra for 72 hours day and night.  After that wear a sports bra during the day until you return to the office. Swelling and bruising can take several days to resolve.  °6. It is common to experience some constipation if taking pain medication after surgery.  Increasing fluid intake and taking a stool softener will usually help or prevent this problem from occurring.  A mild laxative (Milk of Magnesia or Miralax) should be taken according to package directions if there are no bowel movements after 48 hours. °7. Unless discharge instructions indicate otherwise, you may remove your bandages 48 hours after surgery and you may shower at that time.  You may have steri-strips (small skin tapes) in place directly over the incision.   These strips should be left on the skin for 7-10 days and will come off on their own.  If your surgeon used skin glue on the incision, you may shower in 24 hours.  The glue will flake off over the next 2-3 weeks.  Any sutures or staples will be removed at the office during your follow-up visit. °8. ACTIVITIES:  You may resume regular daily activities (gradually increasing) beginning the next day.  Wearing a good support bra or sports bra minimizes pain and swelling.  You may have sexual intercourse when it is comfortable. °a. You may drive when you no longer are taking prescription pain medication, you can comfortably wear a seatbelt, and you can safely maneuver your car and apply brakes. °b. RETURN TO WORK:  ______________________________________________________________________________________ °9. You should see your doctor in the office for a follow-up appointment approximately two weeks after your surgery.  Your doctor’s nurse will typically make your follow-up appointment when she calls you with your pathology report.  Expect your pathology report 3-4 business days after your surgery.  You may call to check if you do not hear from us after three days. °10. OTHER INSTRUCTIONS: _______________________________________________________________________________________________ _____________________________________________________________________________________________________________________________________ °_____________________________________________________________________________________________________________________________________ °_____________________________________________________________________________________________________________________________________ ° °WHEN TO CALL DR WAKEFIELD: °1. Fever over 101.0 °2. Nausea and/or vomiting. °3. Extreme swelling or bruising. °4. Continued bleeding from incision. °5. Increased pain, redness, or drainage from the incision. ° °The clinic staff is available to  answer your questions during regular business hours.  Please don’t hesitate to call and ask to speak to one of the nurses for   clinical concerns.  If you have a medical emergency, go to the nearest emergency room or call 911.  A surgeon from [facility] is always on call at the hospital. ° °For further questions, please visit [email or web address] ° °Post Anesthesia Home Care Instructions ° °Activity: °Get plenty of rest for the remainder of the day. A responsible adult should stay with you for 24 hours following the procedure.  °For the next 24 hours, DO NOT: °-Drive a car °-Operate machinery °-Drink alcoholic beverages °-Take any medication unless instructed by your physician °-Make any legal decisions or sign important papers. ° °Meals: °Start with liquid foods such as gelatin or soup. Progress to regular foods as tolerated. Avoid greasy, spicy, heavy foods. If nausea and/or vomiting occur, drink only clear liquids until the nausea and/or vomiting subsides. Call your physician if vomiting continues. ° °Special Instructions/Symptoms: °Your throat may feel dry or sore from the anesthesia or the breathing tube placed in your throat during surgery. If this causes discomfort, gargle with warm salt water. The discomfort should disappear within 24 hours. ° °

## 2014-08-03 NOTE — Anesthesia Preprocedure Evaluation (Signed)
Anesthesia Evaluation  Patient identified by MRN, date of birth, ID band Patient awake    Reviewed: Allergy & Precautions, H&P , NPO status , Patient's Chart, lab work & pertinent test results  Airway Mallampati: I  TM Distance: >3 FB Neck ROM: Full    Dental  (+) Teeth Intact, Dental Advisory Given   Pulmonary former smoker,  breath sounds clear to auscultation        Cardiovascular Rhythm:Regular Rate:Normal     Neuro/Psych    GI/Hepatic GERD-  Medicated and Controlled,  Endo/Other    Renal/GU      Musculoskeletal   Abdominal   Peds  Hematology   Anesthesia Other Findings   Reproductive/Obstetrics                             Anesthesia Physical Anesthesia Plan  ASA: II  Anesthesia Plan: General   Post-op Pain Management:    Induction: Intravenous  Airway Management Planned: LMA  Additional Equipment:   Intra-op Plan:   Post-operative Plan: Extubation in OR  Informed Consent: I have reviewed the patients History and Physical, chart, labs and discussed the procedure including the risks, benefits and alternatives for the proposed anesthesia with the patient or authorized representative who has indicated his/her understanding and acceptance.   Dental advisory given  Plan Discussed with: CRNA, Anesthesiologist and Surgeon  Anesthesia Plan Comments:         Anesthesia Quick Evaluation

## 2014-08-03 NOTE — H&P (Signed)
33 yo healthy female who presents with a right breast mass for the past couple weeks. This area is tender and is hurting constantly. especially when sleeping at night. She has no nipple dc. She has no prior breast history. No FH of breast or ovarian cancer. She underwent mm and [place] below and this looks benign. She is interested in excision due to her concern about what it is. EXAM: DIGITAL DIAGNOSTIC BILATERAL MAMMOGRAM WITH CAD ULTRASOUND RIGHT BREAST COMPARISON: None. ACR Breast Density Category b: There are scattered areas of fibroglandular density. FINDINGS: A radiopaque BB marks the palpable abnormality in the upper-outer quadrant of the right breast. In the upper-outer quadrant of the breast there is a 3.6 x 2.7 x 2.0 cm lesion with internal fat. 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 There are no malignant type microcalcifications. The left breast is negative. Mammographic images were processed with CAD. On physical exam, I palpate mild thickening in the left breast fat 10 o'clock 6 cm from the nipple. Ultrasound is performed, showing a mixed echogenic lesion with internal fat measuring 2.1 x 0.9 x 1.8 cm. It has the appearance of a fibroadenolipoma. 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 RECOMMENDATION: Short-term interval followup right mammogram and possible ultrasound in 6 months is recommended. The importance of self-breast examination was discussed with the patient.   Other Problems Briant Cedar, CMA; 07/11/2014 1:58 PM) Gastroesophageal Reflux Disease Heart murmur Migraine Headache Ulcerative Colitis  Past Surgical History Briant Cedar, [place]; 07/11/2014 1:58 PM) Oral Surgery  Diagnostic Studies History Briant Cedar, [place]; 07/11/2014 1:58 PM) Colonoscopy never Mammogram within last year Pap Smear 1-5 years ago  Allergies Briant Cedar, [place]; 07/11/2014 2:00 PM) Cefdinir *CEPHALOSPORINS* Cortisone *CORTICOSTEROIDS*  Medication  History Briant Cedar, CMA; 07/11/2014 2:00 PM) Hydrocodone-Acetaminophen (5-325MG  Tablet, Oral) Active. ALPRAZolam (0.5MG  Tablet, Oral) Active. Doxycycline Hyclate (100MG  Capsule, Oral) Active. Fluconazole (150MG  Tablet, Oral) Active. MetroNIDAZOLE (500MG  Tablet, Oral) Active. Sulfamethoxazole-TMP DS (800-160MG  Tablet, Oral) Active.  Social History Briant Cedar, [place]; 07/11/2014 1:58 PM) Alcohol use Occasional alcohol use. Caffeine use Carbonated beverages. No drug use Tobacco use Former smoker.  Family History Briant Cedar, [place]; 07/11/2014 1:58 PM) Cancer Sister. Colon Cancer Father. Diabetes Mellitus Father. Heart Disease Father. Ischemic Bowel Disease Father. Respiratory Condition Father.  Pregnancy / Birth History Briant Cedar, [place]; 07/11/2014 1:58 PM) Age at menarche 74 years. Gravida 1 Maternal age 63-25 Para 1 Regular periods  Review of Systems Briant Cedar CMA; 07/11/2014 1:58 PM) General Not Present- Appetite Loss, Chills, Fatigue, Fever, Night Sweats, Weight Gain and Weight Loss. Skin Not Present- Change in Wart/Mole, Dryness, Hives, Jaundice, New Lesions, Non-Healing Wounds, Rash and Ulcer. HEENT Present- Earache. Not Present- Hearing Loss, Hoarseness, Nose Bleed, Oral Ulcers, Ringing in the Ears, Seasonal Allergies, Sinus Pain, Sore Throat, Visual Disturbances, Wears glasses/contact lenses and Yellow Eyes. Respiratory Not Present- Bloody sputum, Chronic Cough, Difficulty Breathing, Snoring and Wheezing. Breast Present- Breast Mass and Breast Pain. Not Present- Nipple Discharge and Skin Changes. Cardiovascular Present- Swelling of Extremities. Not Present- Chest Pain, Difficulty Breathing Lying Down, Leg Cramps, Palpitations, Rapid Heart Rate and Shortness of Breath. Gastrointestinal Present- Bloating and Indigestion. Not Present- Abdominal Pain, Bloody Stool, Change in Bowel Habits, Chronic diarrhea, Constipation, Difficulty  Swallowing, Excessive gas, Gets full quickly at meals, Hemorrhoids, Nausea, Rectal Pain and Vomiting. Female Genitourinary Not Present- Frequency, Nocturia, Painful Urination, Pelvic Pain and Urgency. Musculoskeletal Not Present- Back Pain, Joint Pain, Joint Stiffness, Muscle Pain, Muscle Weakness and Swelling of Extremities. Neurological Not Present- Decreased Memory,  Fainting, Headaches, Numbness, Seizures, Tingling, Tremor, Trouble walking and Weakness. Psychiatric Not Present- Anxiety, Bipolar, Change in Sleep Pattern, Depression, Fearful and Frequent crying. Endocrine Not Present- Cold Intolerance, Excessive Hunger, Hair Changes, Heat Intolerance, Hot flashes and New Diabetes. Hematology Present- Easy Bruising. Not Present- Excessive bleeding, Gland problems, HIV and Persistent Infections.   Vitals Briant Cedar CMA; 07/11/2014 2:01 PM) 07/11/2014 2:00 PM Weight: 157.5 lb Height: 62in Body Surface Area: 1.77 m Body Mass Index: 28.81 kg/m Temp.: 98.70F  Pulse: 64 (Regular)  BP: 120/70 (Sitting, Left Arm, Standard)    Physical Exam Rolm Bookbinder MD; 07/11/2014 2:23 PM) General Mental Status-Alert. Orientation-Oriented X3.  Chest and Lung Exam Chest and lung exam reveals -normal excursion with symmetric chest walls, quiet, even and easy respiratory effort with no use of accessory muscles and on auscultation, normal breath sounds, no adventitious sounds and normal vocal resonance.  Breast Nipples Discharge - Bilateral - None. Breast - Left-Normal.   Cardiovascular Cardiovascular examination reveals -normal heart sounds, regular rate and rhythm with no murmurs.  Lymphatic Head & Neck  General Head & Neck Lymphatics: Bilateral - Description - Normal. Axillary  General Axillary Region: Bilateral - Description - Normal.    Assessment & Plan Rolm Bookbinder MD; 07/11/2014 2:24 PM) BREAST MASS, RIGHT (611.72  N63) Impression: I think this  is benign. she is very concerned about it though. She would like to proceed with excision. We discussed observation, observation with core biopsy also. She does not want to do this. I told her this may not be source of pain and pain may still continue. This is palpable but vague so will do with radioactive seed guidance. procedure and recovery discussed.

## 2014-08-03 NOTE — Anesthesia Postprocedure Evaluation (Signed)
  Anesthesia Post-op Note  Patient: [name] [name]  Procedure(s) Performed: Procedure(s): RIGHT BREAST SEED GUIDED EXCISION (Right)  Patient Location: PACU  Anesthesia Type: General   Level of Consciousness: awake, alert  and oriented  Airway and Oxygen Therapy: Patient Spontanous Breathing  Post-op Pain: mild  Post-op Assessment: Post-op Vital signs reviewed  Post-op Vital Signs: Reviewed  Last Vitals:  Filed Vitals:   08/03/14 1045  BP: 114/74  Pulse: 61  Temp:   Resp: 14    Complications: No apparent anesthesia complications

## 2014-08-03 NOTE — Op Note (Signed)
Preoperative diagnosis: Right breast mass Postoperative diagnosis: Same as above Procedure: Right breast radioactive seed guided excisional biopsy Surgeon: Dr. Serita Grammes Anesthesia: Gen. Specimens: Right breast tissue marked with paint, radioactive seed Estimated blood loss: Minimal Complications: None Drains: None Sponge and needle count correct at completion Disposition to recovery in stable condition  Indications: This is a 34 year old female who has a right breast mass that is vaguely palpable. This area appears benign. We discussed the options of following this, core biopsy, or excising. She would like to have this area excised. I discussed doing this with radioactive seed guidance. She had the radioactive seed placed prior to beginning.  Procedure: After informed consent was obtained the patient was taken to the operating room. She had been given antibiotics. She had sequential compression devices on her legs. She was then placed under general anesthesia with an LMA. Her right breast was prepped and draped in the standard sterile surgical fashion. A surgical timeout was then performed. I had her mammograms in the operating room.  I used the neoprobe to identify the location of the radioactive seed in the right upper outer quadrant. I then made a curvilinear incision. I used the neoprobe to guide the excision of the mass. This was palpable once I had gotten into the breast tissue. The seed was passed off the table as a separate specimen. The mass was marked with paint. Mammograms are taken of both of these to confirm removal of the seed as well as the mass. These were then sent to pathology. I then obtained hemostasis. I closed this with 2-0 Vicryl, 3-0 Vicryl, and 4-0 Monocryl. Dermabond was placed over the wound. A breast binder was placed. She tolerated this well was extubated and transferred to the recovery room in stable condition.

## 2014-08-04 ENCOUNTER — Encounter (HOSPITAL_BASED_OUTPATIENT_CLINIC_OR_DEPARTMENT_OTHER): Payer: Self-pay | Admitting: General Surgery

## 2014-10-02 ENCOUNTER — Ambulatory Visit (HOSPITAL_COMMUNITY)
Admission: RE | Admit: 2014-10-02 | Discharge: 2014-10-02 | Disposition: A | Discharge status: Home / Self Care | Payer: BLUE CROSS/BLUE SHIELD | Source: Ambulatory Visit | Attending: Family Medicine | Admitting: Family Medicine

## 2014-10-02 ENCOUNTER — Ambulatory Visit (INDEPENDENT_AMBULATORY_CARE_PROVIDER_SITE_OTHER): Payer: BLUE CROSS/BLUE SHIELD | Admitting: Family Medicine

## 2014-10-02 ENCOUNTER — Encounter: Payer: Self-pay | Admitting: Family Medicine

## 2014-10-02 VITALS — BP 110/80 | Ht 62.0 in | Wt 158.2 lb

## 2014-10-02 DIAGNOSIS — M25562 Pain in left knee: Secondary | ICD-10-CM | POA: Insufficient documentation

## 2014-10-02 IMAGING — CR DG KNEE COMPLETE 4+V*L*
4 series · 4 of 4 positions shown · non-contrast
Comparison: [DATE]

CLINICAL DATA: 33-year-old female with knee pain behind patella. No
injury. Subsequent encounter.

EXAM:
LEFT KNEE - COMPLETE 4+ VIEW

[view not recorded (1 of 4)]
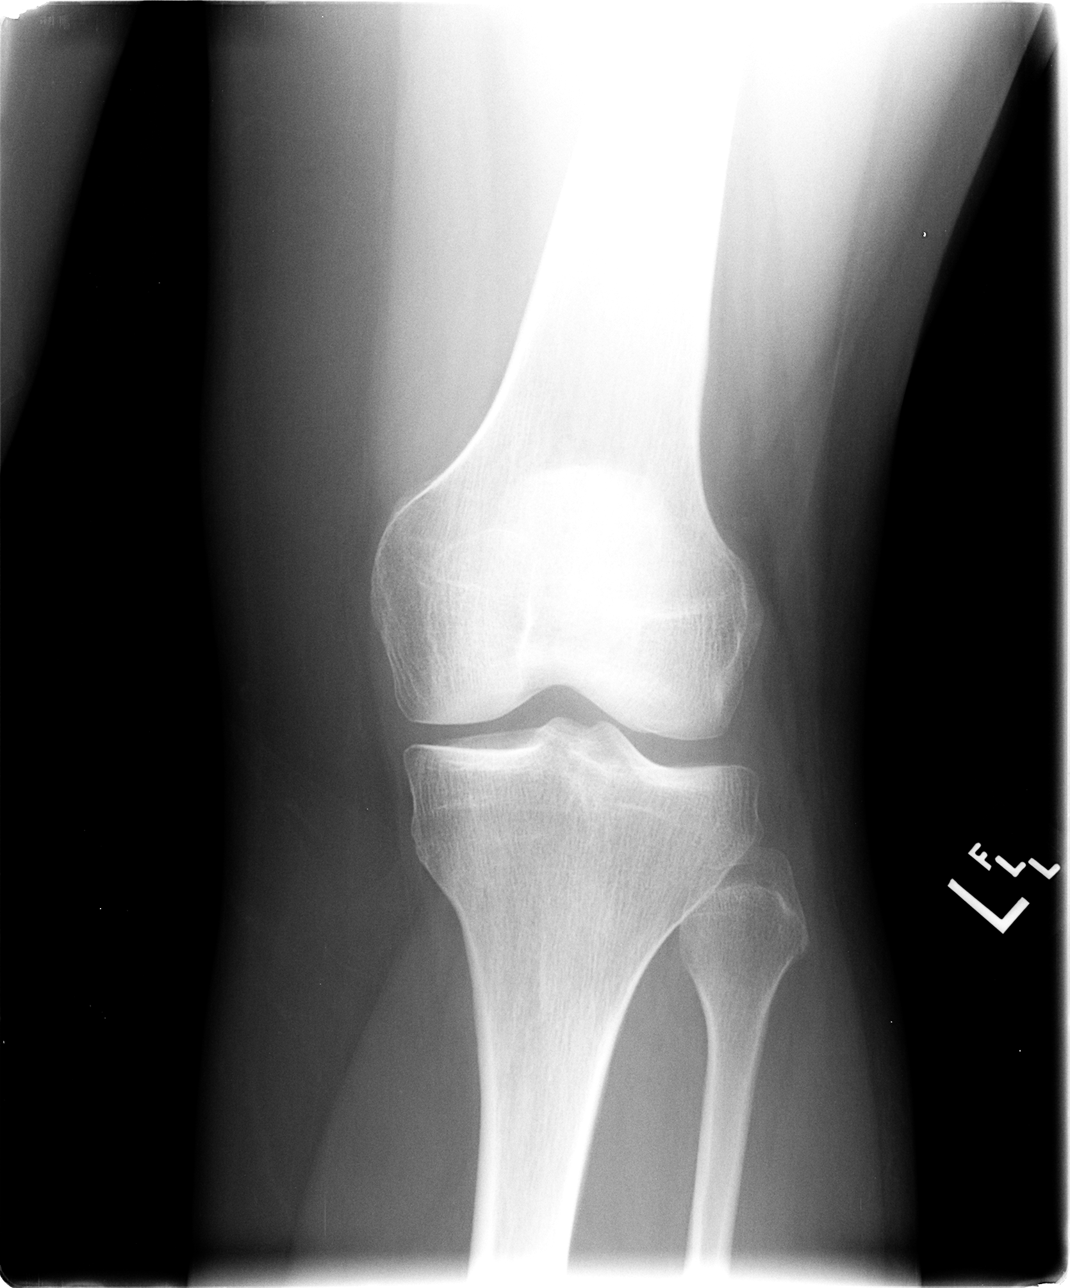

[view not recorded (2 of 4)]
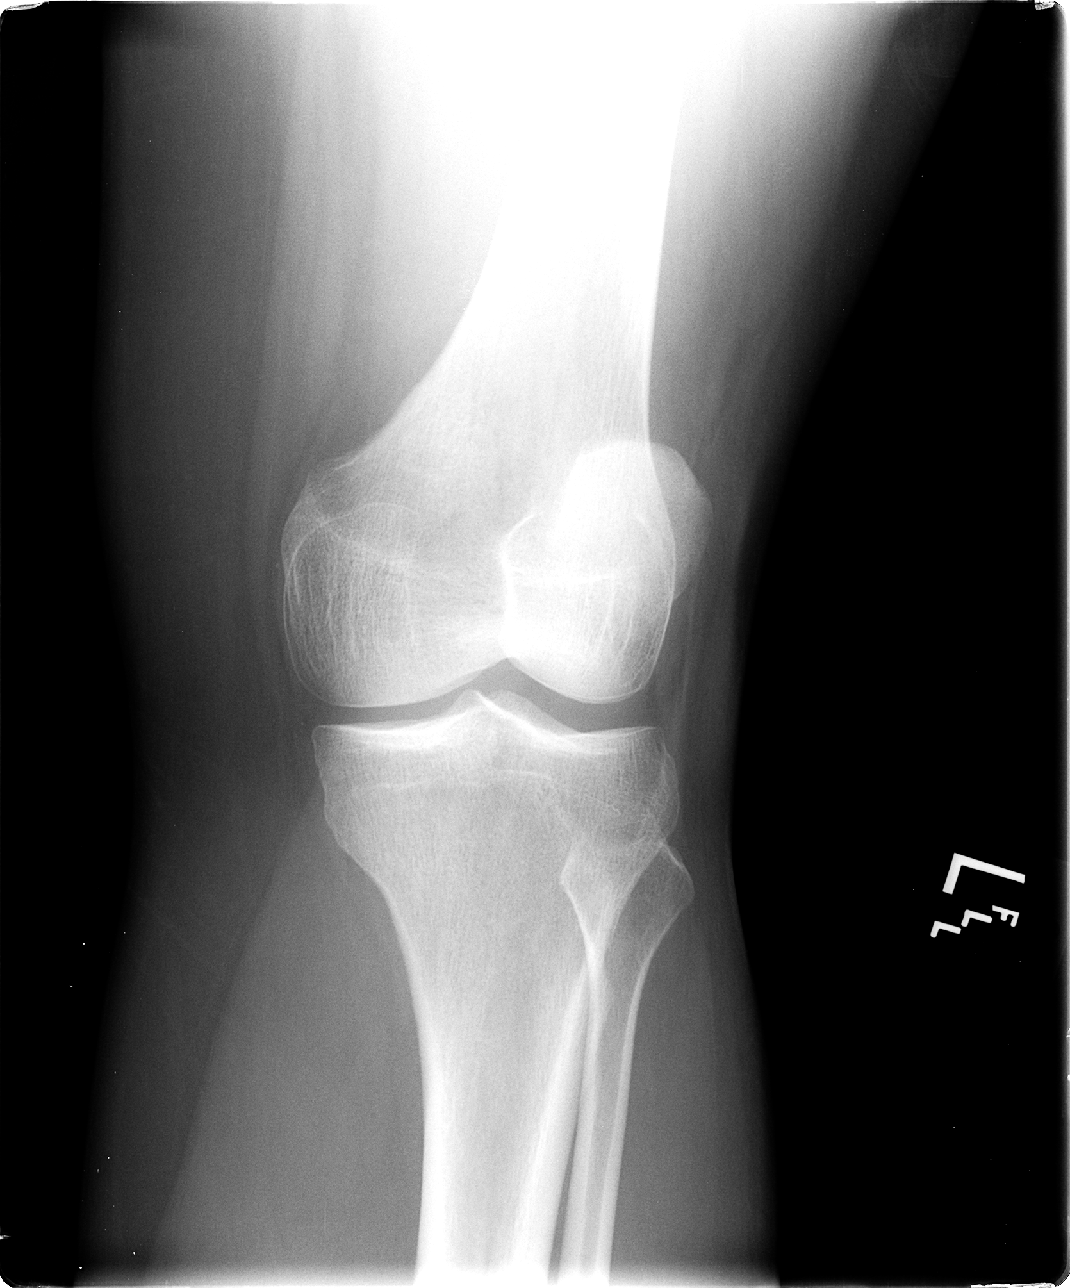

[view not recorded (3 of 4)]
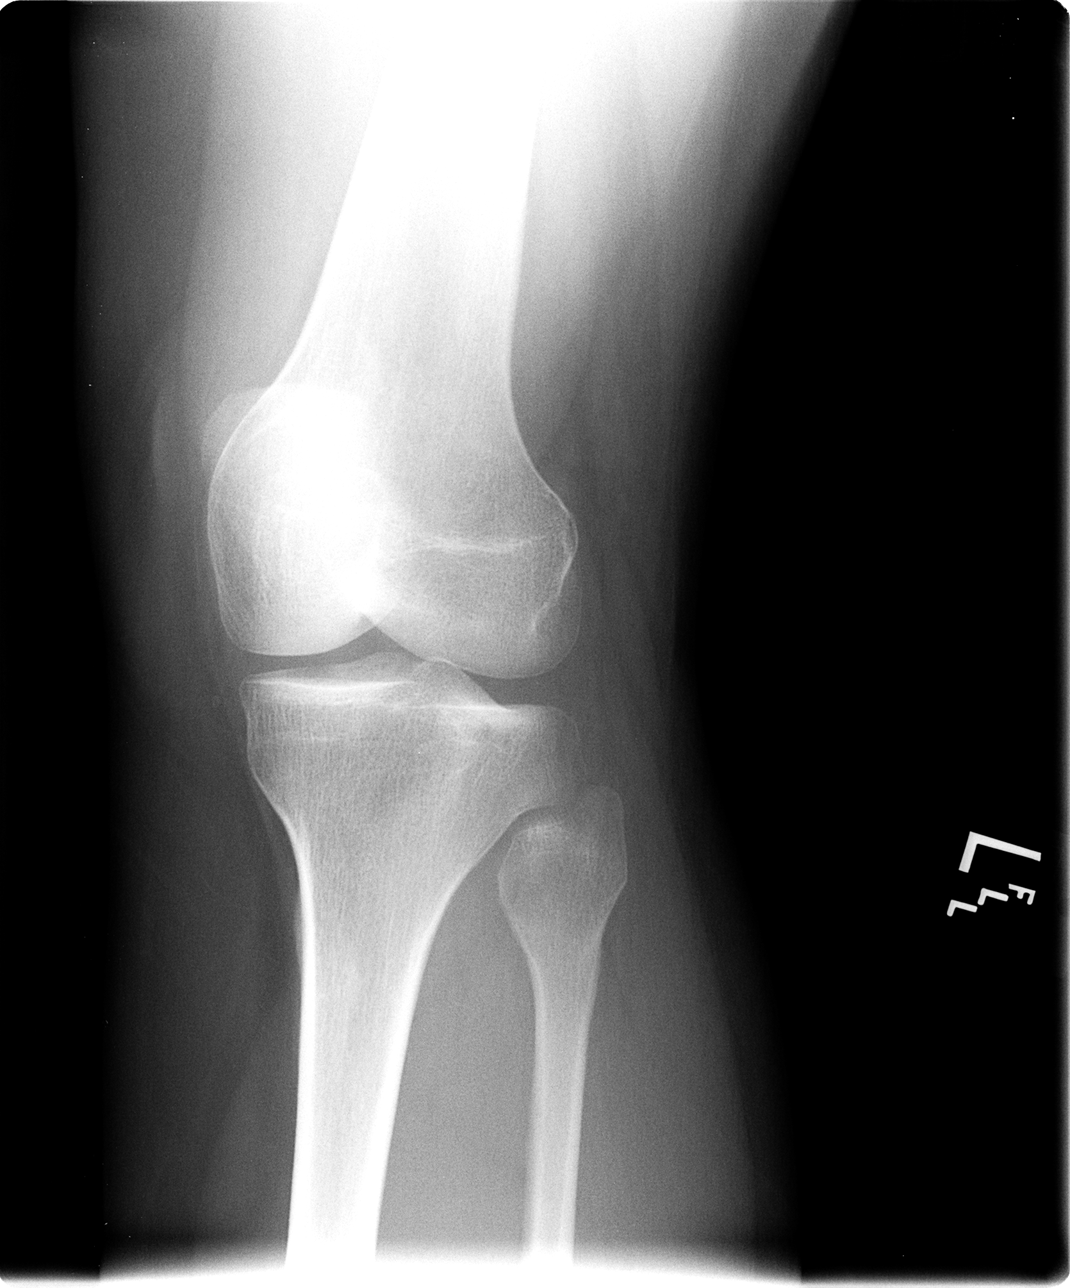

[view not recorded (4 of 4)]
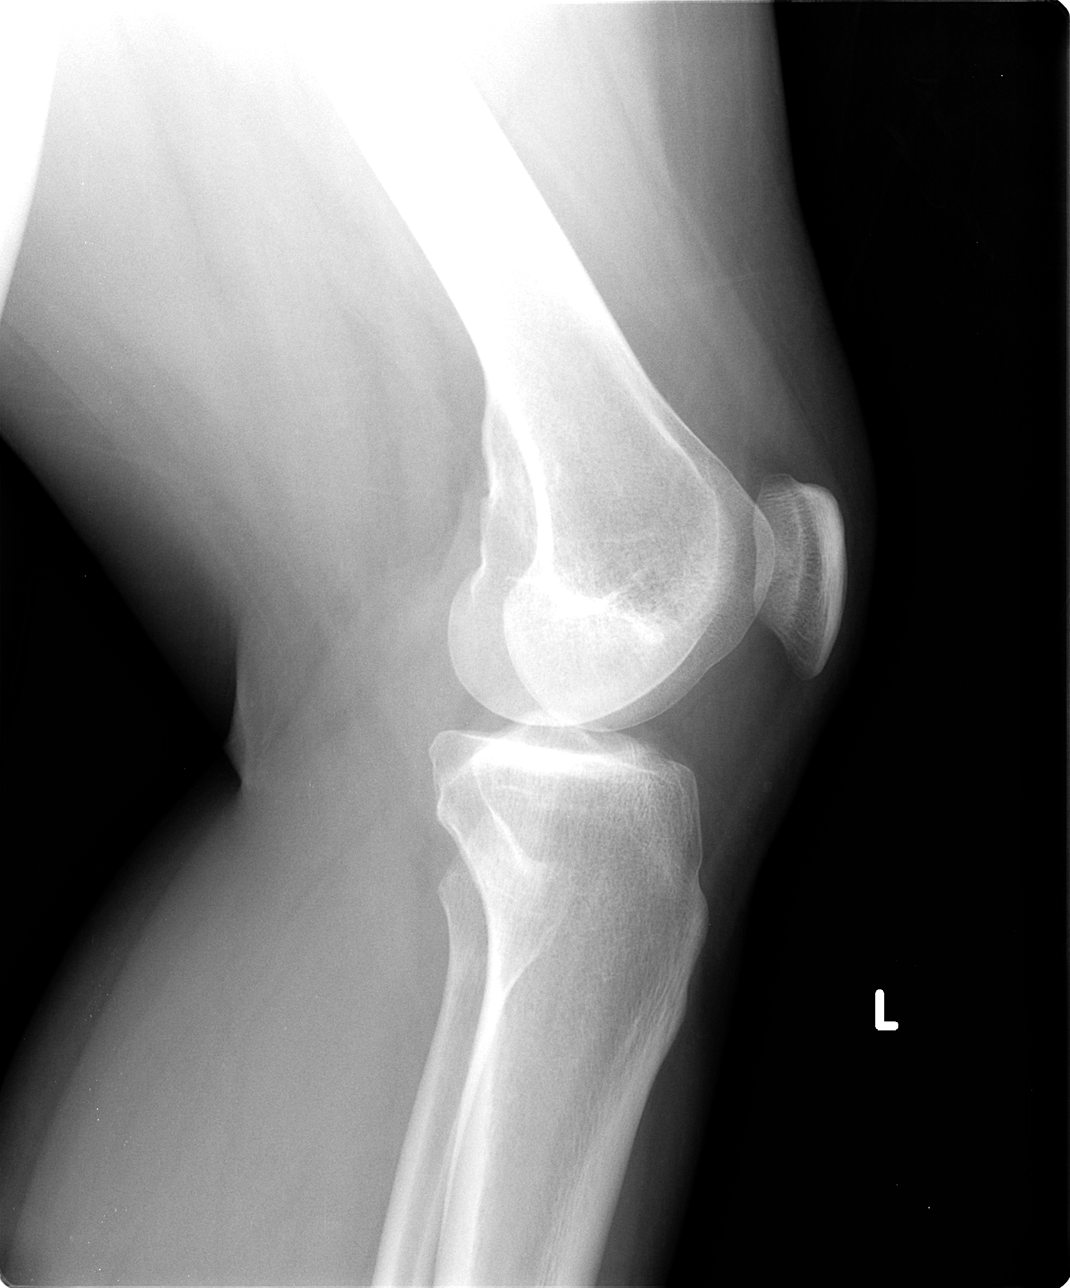

[4 of 4 positions shown; findings below may reference images not displayed]

FINDINGS: Minimal medial tibiofemoral joint space narrowing.

No plain film evidence of joint effusion.

No fracture or dislocation.
IMPRESSION: Minimal medial tibial femoral joint space narrowing.

## 2014-10-02 MED ORDER — ETODOLAC 400 MG PO TABS: 400.0000 mg | ORAL_TABLET | Freq: Two times a day (BID) | ORAL | Status: DC | PRN

## 2014-10-02 NOTE — Progress Notes (Signed)
   Subjective:    Patient ID: [name] [name], female    DOB: [birth year], 34 y.o.   MRN: [id number]  Knee Pain  The incident occurred more than 1 week ago. There was no injury mechanism. The pain is present in the left knee. The quality of the pain is described as aching. The pain is moderate. The pain has been intermittent since onset. Associated symptoms include numbness. She reports no foreign bodies present. The symptoms are aggravated by movement. She has tried NSAIDs for the symptoms. The treatment provided no relief.   Patient states that she has no other concerns at this time.   Does a lot of traveling in the car,  oit in nov for surgery  Was sent to therapy, and had a cortisone injec, with allergic rxn to it.  Takes three ibu off and on, and tylenol and took tramadol   Review of Systems  Neurological: Positive for numbness.   no headache no chest pain no joint pain elsewhere ROS otherwise negative     Objective:   Physical Exam  Alert no apparent distress vital stable. Lungs clear heart regular in rhythm. Left knee anterior pain and deep palpation no effusion no crepitations      Assessment & Plan:  Impression flare of chronic knee pain. Curiously patient cannot handle cortisone injections. Plan Lodine twice a day with food when necessary. Local measures discussed. X-ray involved knee. Orthopedic referral rationale discussed. WSL

## 2014-11-20 ENCOUNTER — Encounter: Payer: Self-pay | Admitting: Adult Health

## 2014-11-20 ENCOUNTER — Ambulatory Visit (INDEPENDENT_AMBULATORY_CARE_PROVIDER_SITE_OTHER): Payer: BLUE CROSS/BLUE SHIELD | Admitting: Adult Health

## 2014-11-20 VITALS — BP 118/78 | HR 69 | Ht 62.0 in | Wt 160.5 lb

## 2014-11-20 DIAGNOSIS — L723 Sebaceous cyst: Secondary | ICD-10-CM

## 2014-11-20 DIAGNOSIS — L0292 Furuncle, unspecified: Secondary | ICD-10-CM | POA: Diagnosis not present

## 2014-11-20 HISTORY — DX: Sebaceous cyst: L72.3

## 2014-11-20 HISTORY — DX: Furuncle, unspecified: L02.92

## 2014-11-20 MED ORDER — SULFAMETHOXAZOLE-TRIMETHOPRIM 800-160 MG PO TABS: 1.0000 | ORAL_TABLET | Freq: Two times a day (BID) | ORAL | Status: DC

## 2014-11-20 MED ORDER — HYDROCODONE-ACETAMINOPHEN 5-325 MG PO TABS: 1.0000 | ORAL_TABLET | Freq: Four times a day (QID) | ORAL | Status: DC | PRN

## 2014-11-20 NOTE — Patient Instructions (Signed)
Abscess An abscess is an infected area that contains a collection of pus and debris.It can occur in almost any part of the body. An abscess is also known as a furuncle or boil. CAUSES  An abscess occurs when tissue gets infected. This can occur from blockage of oil or sweat glands, infection of hair follicles, or a minor injury to the skin. As the body tries to fight the infection, pus collects in the area and creates pressure under the skin. This pressure causes pain. People with weakened immune systems have difficulty fighting infections and get certain abscesses more often.  SYMPTOMS Usually an abscess develops on the skin and becomes a painful mass that is red, warm, and tender. If the abscess forms under the skin, you may feel a moveable soft area under the skin. Some abscesses break open (rupture) on their own, but most will continue to get worse without care. The infection can spread deeper into the body and eventually into the bloodstream, causing you to feel ill.  DIAGNOSIS  Your caregiver will take your medical history and perform a physical exam. A sample of fluid may also be taken from the abscess to determine what is causing your infection. TREATMENT  Your caregiver may prescribe antibiotic medicines to fight the infection. However, taking antibiotics alone usually does not cure an abscess. Your caregiver may need to make a small cut (incision) in the abscess to drain the pus. In some cases, gauze is packed into the abscess to reduce pain and to continue draining the area. HOME CARE INSTRUCTIONS   Only take over-the-counter or prescription medicines for pain, discomfort, or fever as directed by your caregiver.  If you were prescribed antibiotics, take them as directed. Finish them even if you start to feel better.  If gauze is used, follow your caregiver's directions for changing the gauze.  To avoid spreading the infection:  Keep your draining abscess covered with a  bandage.  Wash your hands well.  Do not share personal care items, towels, or whirlpools with others.  Avoid skin contact with others.  Keep your skin and clothes clean around the abscess.  Keep all follow-up appointments as directed by your caregiver. SEEK MEDICAL CARE IF:   You have increased pain, swelling, redness, fluid drainage, or bleeding.  You have muscle aches, chills, or a general ill feeling.  You have a fever. MAKE SURE YOU:   Understand these instructions.  Will watch your condition.  Will get help right away if you are not doing well or get worse. Document Released: 06/11/2005 Document Revised: 03/02/2012 Document Reviewed: 11/14/2011 [facility] Patient Information 2015 [place], [place]. This information is not intended to replace advice given to you by your health care provider. Make sure you discuss any questions you have with your health care provider. Use warm compress Recheck in 2 week Take septra ds 1 bid x 2 weeks

## 2014-11-20 NOTE — Progress Notes (Signed)
Subjective:     Patient ID: [name] [name], female   DOB: [birth year], 34 y.o.   MRN: [id number]  HPI [name] [name] is a 34 year old white female in complaining of pain and cyst in left groin.She had one on right that was opened in past. She travels a lot and sits in meetings.  Review of Systems  Patient denies any headaches, hearing loss, fatigue, blurred vision, shortness of breath, chest pain, abdominal pain, problems with bowel movements, urination, or intercourse. No joint pain or mood swings.+pain and cyst in left groin  Reviewed past medical,surgical, social and family history. Reviewed medications and allergies.     Objective:   Physical Exam BP 118/78 mmHg  Pulse 69  Ht 5\' 2"  (1.575 m)  Wt 160 lb 8 oz (72.802 kg)  BMI 29.35 kg/m2  LMP 02/09/2016skin warm and dry, has 1 cm boil left labia and 1 x  2 cm sebaceous cyst in left groin, that is tender to touch.    Assessment:    Boil Sebaceous cyst    Plan:    Rx norco 5-325 mg #30 1 every 4-6 hours prn pain with no refills Rx septra ds #28 take 1 bid x 14 days with 1 refill Use warm compresses Wear loose clothes Recheck in 2 weeks may need I&D Review handout on boils

## 2014-11-21 ENCOUNTER — Encounter (HOSPITAL_BASED_OUTPATIENT_CLINIC_OR_DEPARTMENT_OTHER): Payer: Self-pay | Admitting: *Deleted

## 2014-11-21 ENCOUNTER — Emergency Department (HOSPITAL_BASED_OUTPATIENT_CLINIC_OR_DEPARTMENT_OTHER): Payer: BLUE CROSS/BLUE SHIELD

## 2014-11-21 ENCOUNTER — Emergency Department (HOSPITAL_BASED_OUTPATIENT_CLINIC_OR_DEPARTMENT_OTHER)
Admission: EM | Admit: 2014-11-21 | Discharge: 2014-11-21 | Disposition: A | Discharge status: Home / Self Care | Payer: BLUE CROSS/BLUE SHIELD | Attending: Emergency Medicine | Admitting: Emergency Medicine

## 2014-11-21 DIAGNOSIS — J189 Pneumonia, unspecified organism: Secondary | ICD-10-CM

## 2014-11-21 DIAGNOSIS — Z79899 Other long term (current) drug therapy: Secondary | ICD-10-CM | POA: Diagnosis not present

## 2014-11-21 DIAGNOSIS — J159 Unspecified bacterial pneumonia: Secondary | ICD-10-CM | POA: Insufficient documentation

## 2014-11-21 DIAGNOSIS — Z792 Long term (current) use of antibiotics: Secondary | ICD-10-CM | POA: Insufficient documentation

## 2014-11-21 DIAGNOSIS — R011 Cardiac murmur, unspecified: Secondary | ICD-10-CM | POA: Insufficient documentation

## 2014-11-21 DIAGNOSIS — K219 Gastro-esophageal reflux disease without esophagitis: Secondary | ICD-10-CM | POA: Diagnosis not present

## 2014-11-21 DIAGNOSIS — L0292 Furuncle, unspecified: Secondary | ICD-10-CM | POA: Diagnosis not present

## 2014-11-21 DIAGNOSIS — Z3202 Encounter for pregnancy test, result negative: Secondary | ICD-10-CM | POA: Insufficient documentation

## 2014-11-21 DIAGNOSIS — Z8742 Personal history of other diseases of the female genital tract: Secondary | ICD-10-CM | POA: Insufficient documentation

## 2014-11-21 DIAGNOSIS — R509 Fever, unspecified: Secondary | ICD-10-CM | POA: Diagnosis present

## 2014-11-21 DIAGNOSIS — Z87891 Personal history of nicotine dependence: Secondary | ICD-10-CM | POA: Diagnosis not present

## 2014-11-21 LAB — URINALYSIS, ROUTINE W REFLEX MICROSCOPIC
BILIRUBIN URINE: NEGATIVE
Glucose, UA: NEGATIVE mg/dL
HGB URINE DIPSTICK: NEGATIVE
Ketones, ur: NEGATIVE mg/dL
NITRITE: NEGATIVE
PH: 7 (ref 5.0–8.0)
PROTEIN: NEGATIVE mg/dL
SPECIFIC GRAVITY, URINE: 1.021 (ref 1.005–1.030)
Urobilinogen, UA: 0.2 mg/dL (ref 0.0–1.0)

## 2014-11-21 LAB — COMPREHENSIVE METABOLIC PANEL
ALBUMIN: 4 g/dL (ref 3.5–5.2)
ALK PHOS: 39 U/L (ref 39–117)
ALT: 19 U/L (ref 0–35)
ANION GAP: 5 (ref 5–15)
AST: 28 U/L (ref 0–37)
BILIRUBIN TOTAL: 0.6 mg/dL (ref 0.3–1.2)
BUN: 12 mg/dL (ref 6–23)
CHLORIDE: 106 mmol/L (ref 96–112)
CO2: 22 mmol/L (ref 19–32)
Calcium: 8.7 mg/dL (ref 8.4–10.5)
Creatinine, Ser: 0.85 mg/dL (ref 0.50–1.10)
GFR calc Af Amer: >90 mL/min (ref >90)
GFR calc non Af Amer: 90 mL/min — ABNORMAL LOW (ref >90)
Glucose, Bld: 124 mg/dL — ABNORMAL HIGH (ref 70–99)
POTASSIUM: 3.3 mmol/L — AB (ref 3.5–5.1)
Sodium: 133 mmol/L — ABNORMAL LOW (ref 135–145)
TOTAL PROTEIN: 7.5 g/dL (ref 6.0–8.3)

## 2014-11-21 LAB — CBC WITH DIFFERENTIAL/PLATELET
BASOS ABS: 0 10*3/uL (ref 0.0–0.1)
Basophils Relative: 0 % (ref 0–1)
Eosinophils Absolute: 0 10*3/uL (ref 0.0–0.7)
Eosinophils Relative: 0 % (ref 0–5)
HEMATOCRIT: 38.9 % (ref 36.0–46.0)
Hemoglobin: 13.4 g/dL (ref 12.0–15.0)
LYMPHS ABS: 0.3 10*3/uL — AB (ref 0.7–4.0)
Lymphocytes Relative: 4 % — ABNORMAL LOW (ref 12–46)
MCH: 30.1 pg (ref 26.0–34.0)
MCHC: 34.4 g/dL (ref 30.0–36.0)
MCV: 87.4 fL (ref 78.0–100.0)
MONO ABS: 0.4 10*3/uL (ref 0.1–1.0)
MONOS PCT: 5 % (ref 3–12)
NEUTROS ABS: 7.3 10*3/uL (ref 1.7–7.7)
NEUTROS PCT: 91 % — AB (ref 43–77)
Platelets: 225 10*3/uL (ref 150–400)
RBC: 4.45 MIL/uL (ref 3.87–5.11)
RDW: 12.5 % (ref 11.5–15.5)
WBC: 8 10*3/uL (ref 4.0–10.5)

## 2014-11-21 LAB — I-STAT CG4 LACTIC ACID, ED
LACTIC ACID, VENOUS: 0.76 mmol/L (ref 0.5–2.0)
Lactic Acid, Venous: 2.94 mmol/L (ref 0.5–2.0)

## 2014-11-21 LAB — URINE MICROSCOPIC-ADD ON

## 2014-11-21 LAB — PREGNANCY, URINE: Preg Test, Ur: NEGATIVE

## 2014-11-21 IMAGING — CR DG CHEST 2V
2 series · 2 of 2 positions shown · non-contrast
Comparison: [DATE] and earlier.

CLINICAL DATA: 33-year-old female with fever nausea and vomiting
after taking Bactrim. Initial encounter.

EXAM:
CHEST  2 VIEW

[w chest pa]
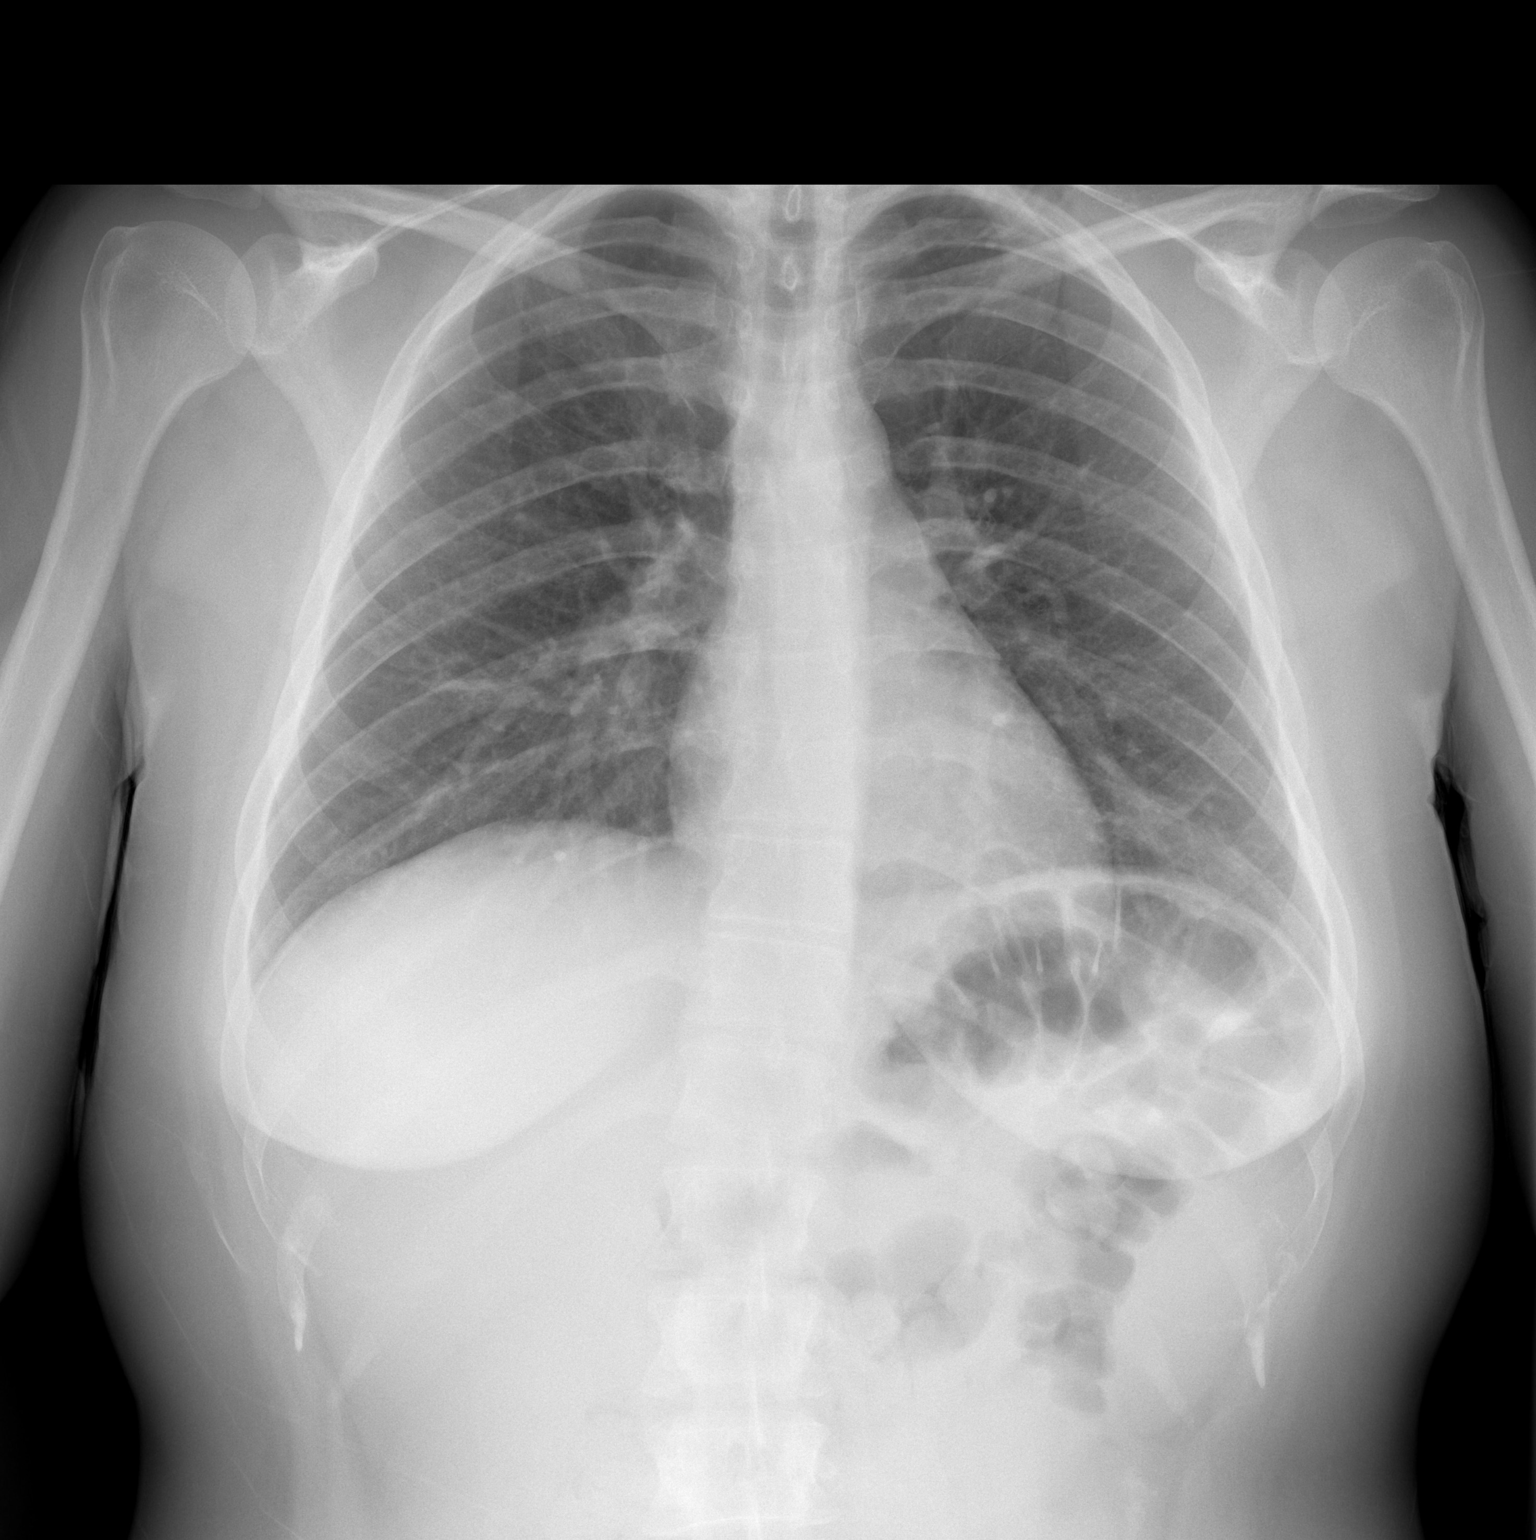

[w chest lat]
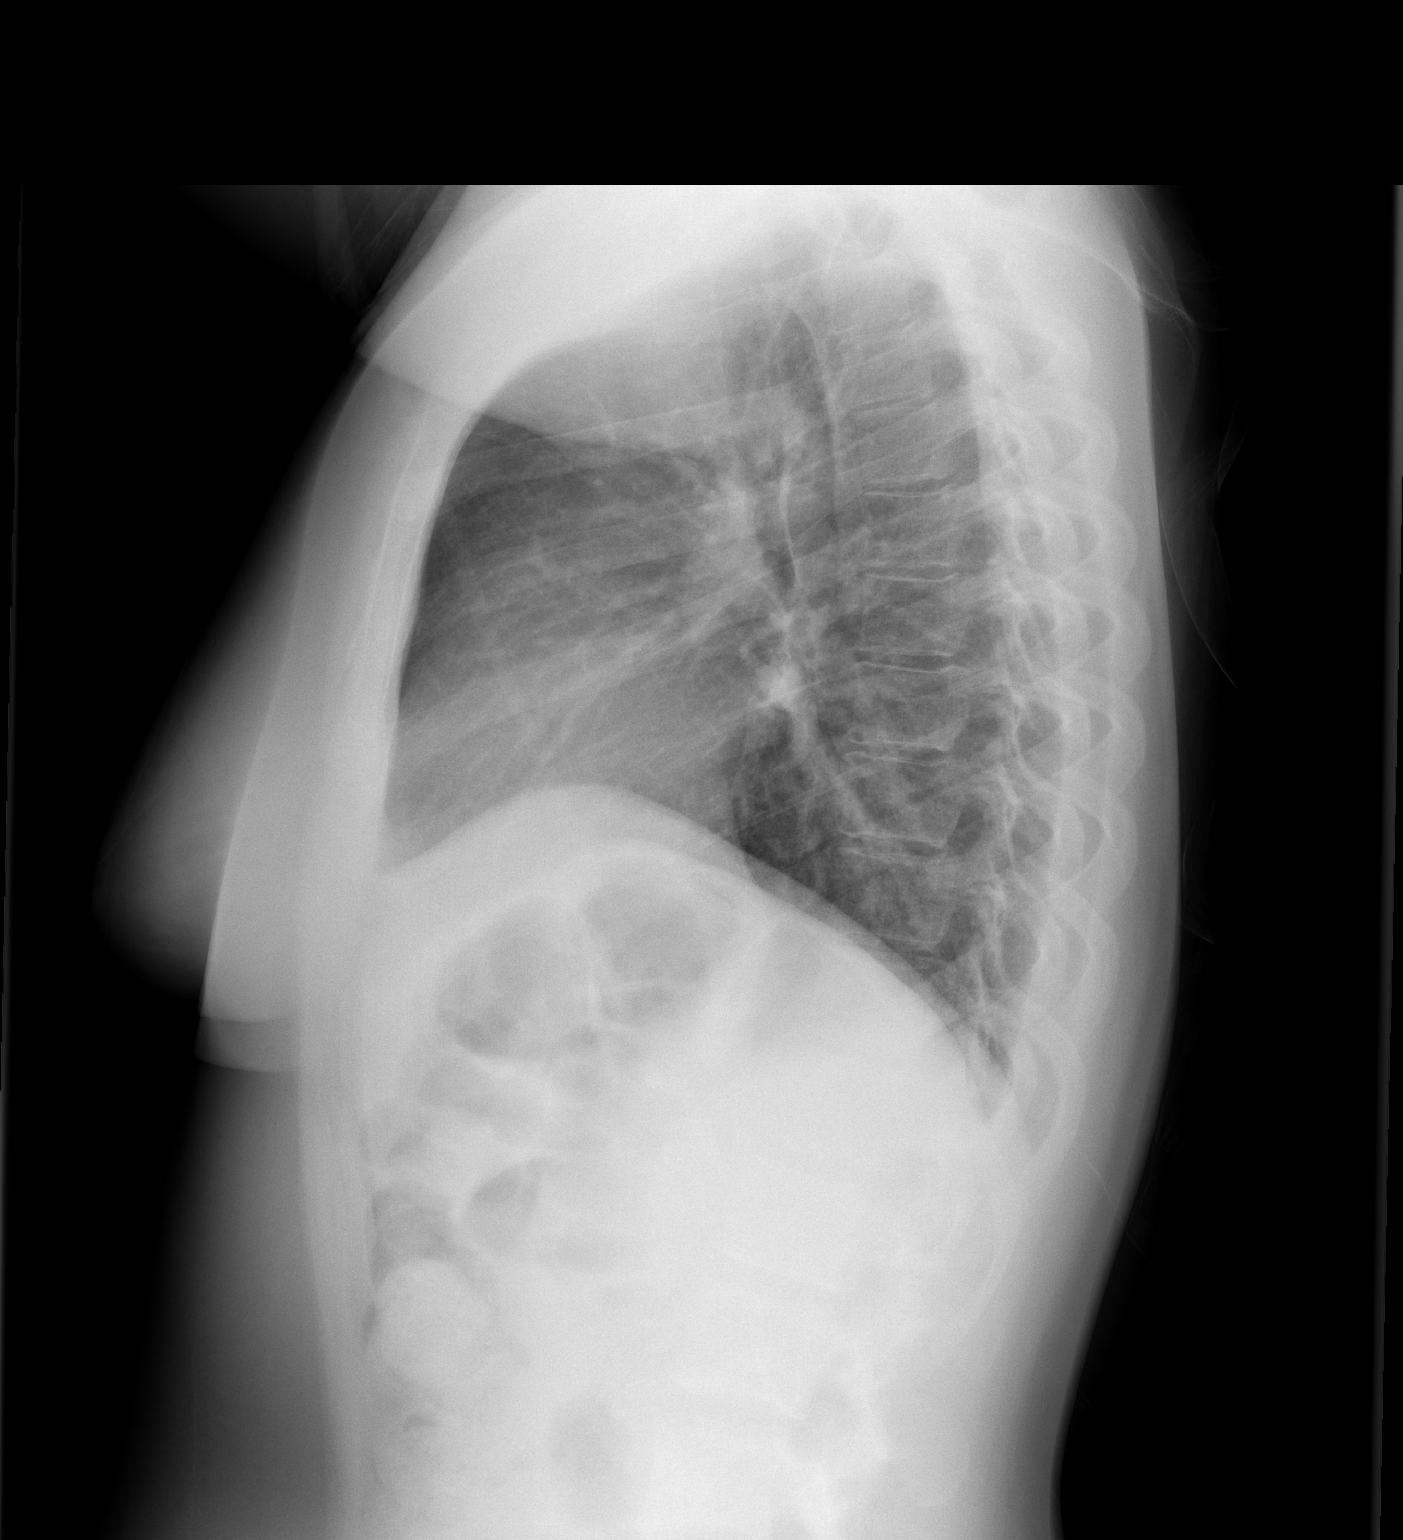

[2 of 2 positions shown; findings below may reference images not displayed]

FINDINGS: Lung volumes remain normal. Normal cardiac size and mediastinal
contours. Visualized tracheal air column is within normal limits. No
pneumothorax, pulmonary edema, pleural effusion. Mild streaky
opacity at the left lung base seen only on the frontal view. No
other confluent pulmonary opacity. No acute osseous abnormality
identified.
IMPRESSION: Mild streaky opacity at the left lung base seen only on the frontal
view. Top considerations in this setting are atelectasis and
bronchopneumonia.

## 2014-11-21 MED ORDER — LEVOFLOXACIN IN D5W 750 MG/150ML IV SOLN
750.0000 mg | Freq: Once | INTRAVENOUS | Status: AC
  Administered 2014-11-21: 750 mg via INTRAVENOUS
  Filled 2014-11-21: qty 150

## 2014-11-21 MED ORDER — ONDANSETRON HCL 4 MG/2ML IJ SOLN
4.0000 mg | Freq: Once | INTRAMUSCULAR | Status: AC
  Administered 2014-11-21: 4 mg via INTRAVENOUS
  Filled 2014-11-21: qty 2

## 2014-11-21 MED ORDER — LEVOFLOXACIN 750 MG PO TABS: 750.0000 mg | ORAL_TABLET | Freq: Every day | ORAL | Status: DC

## 2014-11-21 MED ORDER — ACETAMINOPHEN 500 MG PO TABS
1000.0000 mg | ORAL_TABLET | Freq: Once | ORAL | Status: AC
  Administered 2014-11-21: 1000 mg via ORAL
  Filled 2014-11-21: qty 2

## 2014-11-21 MED ORDER — SODIUM CHLORIDE 0.9 % IV BOLUS (SEPSIS)
1000.0000 mL | Freq: Once | INTRAVENOUS | Status: AC
  Administered 2014-11-21: 1000 mL via INTRAVENOUS

## 2014-11-21 MED ORDER — IBUPROFEN 800 MG PO TABS
800.0000 mg | ORAL_TABLET | Freq: Once | ORAL | Status: AC
  Administered 2014-11-21: 800 mg via ORAL
  Filled 2014-11-21: qty 1

## 2014-11-21 NOTE — ED Notes (Signed)
Patient states that she started taking bactrim last night and started experiencing nausea/vomiting, flushed face and chest around 4am. Took pepto-bismal. Had a fever last night 102

## 2014-11-21 NOTE — ED Notes (Signed)
Lactic Acid 0.76. MD given results

## 2014-11-21 NOTE — ED Notes (Signed)
Dr Darl Householder aware of BP.

## 2014-11-21 NOTE — ED Provider Notes (Signed)
CSN: [id number]     Arrival date & time 11/21/14  0719 History   First MD Initiated Contact with Patient 11/21/14 [phone]     Chief Complaint  Patient presents with  . Allergic Reaction     (Consider location/radiation/quality/duration/timing/severity/associated sxs/prior Treatment) The history is provided by the patient.  [name] [name] is a 34 y.o. female hx of fibromyalgia, L groin cyst here with fever, vomiting. She saw her primary doctor yesterday about her cysts in the left groin and was prescribed a course of Bactrim. Took a dose of Bactrim last night and starts developing fever as well as flushing in her chest. She had fever 102 at home. She also took some Pepto-Bismol with minimal relief. Has an episode of vomiting this morning but no diarrhea or urinary symptoms.   Past Medical History  Diagnosis Date  . Colitis     no current problems  . GERD (gastroesophageal reflux disease)   . Fibromyalgia   . Heart murmur     states no known problem  . Breast mass, right 07/2014  . Boil 11/20/2014  . Sebaceous cyst 11/20/2014   Past Surgical History  Procedure Laterality Date  . Wisdom tooth extraction    . Colposcopy      x 2  . Laparoscopic tubal ligation Bilateral 06/15/2013    Procedure: LAPAROSCOPIC BILATERAL TUBAL LIGATION WITH CAUTERY;  Surgeon: Florian Buff, MD;  Location: [facility];  Service: Gynecology;  Laterality: Bilateral;  . Radioactive seed guided excisional breast biopsy Right 08/03/2014    Procedure: RIGHT BREAST SEED GUIDED EXCISION;  Surgeon: Rolm Bookbinder, MD;  Location: [facility];  Service: General;  Laterality: Right;  . Tubal ligation     Family History  Problem Relation Age of Onset  . COPD Father   . Diabetes Father   . Heart disease Father   . Diverticulosis Father   . Diabetes Sister   . Leukemia Sister   . Diabetes Other   . Cancer Other   . Thyroid disease Other   . Other Mother     shingles   History  Substance Use Topics   . Smoking status: Former Smoker -- 0.00 packs/day for 0 years    Types: Cigarettes    Quit date: 12/02/2007  . Smokeless tobacco: Never Used  . Alcohol Use: No   OB History    Gravida Para Term Preterm AB TAB SAB Ectopic Multiple Living   1 1 1       1      Review of Systems  Constitutional: Positive for fever and chills.  Gastrointestinal: Positive for vomiting.  All other systems reviewed and are negative.     Allergies  Cortisone; Cefdinir; and Flexeril  Home Medications   Prior to Admission medications   Medication Sig Start Date End Date Taking? Authorizing Provider  EPINEPHrine (EPIPEN 2-PAK) 0.3 mg/0.3 mL IJ SOAJ injection Inject into the muscle once.    Historical Provider, MD  HYDROcodone-acetaminophen (NORCO/VICODIN) 5-325 MG per tablet Take 1 tablet by mouth every 6 (six) hours as needed. 11/20/14   Estill Dooms, NP  ibuprofen (ADVIL,MOTRIN) 200 MG tablet Take 200 mg by mouth every 6 (six) hours as needed.    Historical Provider, MD  Melatonin 1 MG TABS Take by mouth as needed.     Historical Provider, MD  ranitidine (ZANTAC) 150 MG tablet Take 150 mg by mouth 2 (two) times daily.    Historical Provider, MD  sulfamethoxazole-trimethoprim (BACTRIM DS,SEPTRA DS)  800-160 MG per tablet Take 1 tablet by mouth 2 (two) times daily. 11/20/14   Estill Dooms, NP   BP 85/42 mmHg  Pulse 83  Temp(Src) 99.8 F (37.7 C) (Oral)  Resp 16  SpO2 100%  LMP 10/24/2014 Physical Exam  Constitutional: She is oriented to person, place, and time. She appears well-developed.  HENT:  Head: Normocephalic.  Slightly dehydrated   Eyes: Conjunctivae are normal. Pupils are equal, round, and reactive to light.  Neck: Normal range of motion. Neck supple.  Cardiovascular: Regular rhythm and normal heart sounds.   Slightly tachy   Pulmonary/Chest: Effort normal and breath sounds normal. No respiratory distress. She has no wheezes. She has no rales.  Abdominal: Soft. Bowel sounds are  normal. She exhibits no distension. There is no tenderness. There is no rebound.  Musculoskeletal: Normal range of motion. She exhibits no edema or tenderness.  Neurological: She is alert and oriented to person, place, and time. No cranial nerve deficit. Coordination normal.  Skin:  Small cyst L groin not involving vaginal area. Not fluctuance, ? Small cellulitis   Psychiatric: She has a normal mood and affect. Her behavior is normal. Thought content normal.  Nursing note and vitals reviewed.   ED Course  Procedures (including critical care time) Labs Review Labs Reviewed  CBC WITH DIFFERENTIAL/PLATELET - Abnormal; Notable for the following:    Neutrophils Relative % 91 (*)    Lymphocytes Relative 4 (*)    Lymphs Abs 0.3 (*)    All other components within normal limits  COMPREHENSIVE METABOLIC PANEL - Abnormal; Notable for the following:    Sodium 133 (*)    Potassium 3.3 (*)    Glucose, Bld 124 (*)    GFR calc non Af Amer 90 (*)    All other components within normal limits  URINALYSIS, ROUTINE W REFLEX MICROSCOPIC - Abnormal; Notable for the following:    Color, Urine YELLOW (*)    APPearance CLEAR (*)    Leukocytes, UA SMALL (*)    All other components within normal limits  URINE MICROSCOPIC-ADD ON - Abnormal; Notable for the following:    Bacteria, UA FEW (*)    All other components within normal limits  I-STAT CG4 LACTIC ACID, ED - Abnormal; Notable for the following:    Lactic Acid, Venous 2.94 (*)    All other components within normal limits  PREGNANCY, URINE  I-STAT CG4 LACTIC ACID, ED    Imaging Review Dg Chest 2 View  11/21/2014   CLINICAL DATA:  34 year old female with fever nausea and vomiting after taking Bactrim. Initial encounter.  EXAM: CHEST  2 VIEW  COMPARISON:  08/11/2011 and earlier.  FINDINGS: Lung volumes remain normal. Normal cardiac size and mediastinal contours. Visualized tracheal air column is within normal limits. No pneumothorax, pulmonary edema,  pleural effusion. Mild streaky opacity at the left lung base seen only on the frontal view. No other confluent pulmonary opacity. No acute osseous abnormality identified.  IMPRESSION: Mild streaky opacity at the left lung base seen only on the frontal view. Top considerations in this setting are atelectasis and bronchopneumonia.   Electronically Signed   By: Genevie Ann M.D.   On: 11/21/2014 08:05     EKG Interpretation None      MDM   Final diagnoses:  None    JOANELL CRESSLER is a 34 y.o. female here with fever, vomiting after taking bactrim. Likely viral syndrome that is exacerbate by abx. I don't think she has  a true allergic reaction to bactrim. No rash on my exam. Will check labs, UA, CXR. Will hydrate and reassess.   12:35 PM Patient's CXR showed pneumonia. Lactate 2.9. After 3L NS lactate became normal. WBC nl. Was tachy initially, improved with IVF. Briefly became hypotensive to 80s but was 103/50 on discharge. Will dc with course of levaquin. Will discharge.     Wandra Arthurs, MD 11/21/14 1239

## 2014-11-21 NOTE — Discharge Instructions (Signed)
Take levaquin daily for a week.   Stay hydrated.   Follow up with your doctor.   Stop bactrim.   Return to ER if you have fever, worse shortness of breath, passing out.

## 2014-12-04 ENCOUNTER — Ambulatory Visit (INDEPENDENT_AMBULATORY_CARE_PROVIDER_SITE_OTHER): Payer: BLUE CROSS/BLUE SHIELD | Admitting: Adult Health

## 2014-12-04 ENCOUNTER — Encounter: Payer: Self-pay | Admitting: Adult Health

## 2014-12-04 VITALS — BP 104/62 | HR 64 | Ht 62.0 in | Wt 160.0 lb

## 2014-12-04 DIAGNOSIS — L723 Sebaceous cyst: Secondary | ICD-10-CM | POA: Diagnosis not present

## 2014-12-04 NOTE — Progress Notes (Signed)
Subjective:     Patient ID: [name] [name], female   DOB: 1981-04-19, 34 y.o.   MRN: [id number]  HPI  [name] [name] is a 34 year old white female,back  in follow up of boil and sebaceous cysts.She was seen 3/7 and prescribed septra ds and took 1 dose and had fever and elevated heart rate, went to ER and was diagnosed with pneumonia, was treated with IV fluids and Levaquin and boil resolved.  Review of Systems  Denies any pain now, all other systems negative  Reviewed past medical,surgical, social and family history. Reviewed medications and allergies.     Objective:   Physical Exam BP 104/62 mmHg  Pulse 64  Ht 5\' 2"  (1.575 m)  Wt 160 lb (72.576 kg)  BMI 29.26 kg/m2  LMP 02/09/2016Boil resolved in left groin still has sebaceous cyst but is not red and has no pain    Assessment:     Sebaceous cyst' Resolved boil    Plan:     Pap and physical  In 3 months  Call prn

## 2014-12-04 NOTE — Patient Instructions (Signed)
Pap and physical in 3 months

## 2015-01-25 ENCOUNTER — Encounter: Payer: Self-pay | Admitting: Adult Health

## 2015-01-25 ENCOUNTER — Ambulatory Visit (INDEPENDENT_AMBULATORY_CARE_PROVIDER_SITE_OTHER): Payer: BLUE CROSS/BLUE SHIELD | Admitting: Adult Health

## 2015-01-25 VITALS — BP 120/68 | HR 72 | Ht 62.0 in | Wt 160.5 lb

## 2015-01-25 DIAGNOSIS — L723 Sebaceous cyst: Secondary | ICD-10-CM

## 2015-01-25 MED ORDER — SILVER SULFADIAZINE 1 % EX CREA: 1.0000 "application " | TOPICAL_CREAM | Freq: Two times a day (BID) | CUTANEOUS | Status: DC

## 2015-01-25 MED ORDER — DOXYCYCLINE HYCLATE 100 MG PO CAPS: 100.0000 mg | ORAL_CAPSULE | Freq: Two times a day (BID) | ORAL | Status: DC

## 2015-01-25 MED ORDER — ONDANSETRON 4 MG PO TBDP: 4.0000 mg | ORAL_TABLET | Freq: Three times a day (TID) | ORAL | Status: DC | PRN

## 2015-01-25 MED ORDER — HYDROCODONE-ACETAMINOPHEN 5-325 MG PO TABS: 1.0000 | ORAL_TABLET | Freq: Four times a day (QID) | ORAL | Status: DC | PRN

## 2015-01-25 NOTE — Patient Instructions (Signed)
Return in 3 weeks for cyst removal Take doxy  Use silvadene

## 2015-01-25 NOTE — Progress Notes (Signed)
Subjective:     Patient ID: [name] [name], female   DOB: September 02, 1981, 34 y.o.   MRN: [id number]  HPI [name] [name] is a 34 year old white female in complaining of pain in left groin has cyst, that is chronic.It drained some yesterday.  Review of Systems Pain at cyst left groin,all other systems negative  Reviewed past medical,surgical, social and family history. Reviewed medications and allergies.     Objective:   Physical Exam BP 120/68 mmHg  Pulse 72  Ht 5\' 2"  (1.575 m)  Wt 160 lb 8 oz (72.802 kg)  BMI 29.35 kg/m2  LMP 01/08/2015 has 2 cm area left groin that is tender and slightly bruised in appearance, not draining, Dr [name] [name] in to co examine, will give doxycycline for 10 days and use silvadene till appt and return in 3 weeks for removal with Dr [name] [name].    Assessment:     Sebaceous cyst     Plan:     Rx doxycycline 100 mg #20 1 bid x 10 days no refills Rx zofran 4 mg ODT #30 1 every 8 hours prn with 1 refill Rx norco 5-325 mg #30 1 every 6 hours prn no refills Rx silvadene cream 50 gm use bid to affected area Return in 3 weeks to see Dr [name] [name] for removal  Wear loose clothes

## 2015-02-15 ENCOUNTER — Ambulatory Visit: Payer: BLUE CROSS/BLUE SHIELD | Admitting: Obstetrics & Gynecology

## 2015-02-19 ENCOUNTER — Ambulatory Visit (INDEPENDENT_AMBULATORY_CARE_PROVIDER_SITE_OTHER): Payer: BLUE CROSS/BLUE SHIELD | Admitting: Obstetrics & Gynecology

## 2015-02-19 ENCOUNTER — Encounter: Payer: Self-pay | Admitting: Obstetrics & Gynecology

## 2015-02-19 ENCOUNTER — Other Ambulatory Visit: Payer: Self-pay | Admitting: Obstetrics & Gynecology

## 2015-02-19 VITALS — BP 100/60 | HR 72 | Wt 162.0 lb

## 2015-02-19 DIAGNOSIS — L02426 Furuncle of left lower limb: Secondary | ICD-10-CM

## 2015-02-19 DIAGNOSIS — L02429 Furuncle of limb, unspecified: Secondary | ICD-10-CM

## 2015-02-19 NOTE — Progress Notes (Signed)
Patient ID: [name] [name], female   DOB: 1980-09-20, 34 y.o.   MRN: [id number] PROCEDURE NOTE  PRE-OP DIAGNOSIS:  chronically infected hair follicle, boil  PROCEDURE:  Skin Lesion Excision(s)  INDICATIONS:  [name] [name] is a 34 y.o. female who presents for minor skin surgery.  The patient understands all risks, benefits, indications, potential complications, and alternatives, and freely consents for the procedure.  The patient also understands the option of performing no surgery, the risk for scarring, and the technique of the procedure.  ANESTHESIA:  Local.  TECHNIQUE:  After informed consent was obtained, and after the skin was prepped and draped, 1% lidocaine without epinephrine for anesthetic was injected around and underneath the site.   elliptical excision in total was performed. Four 3-0 ethilon sutures were placed with good hemostasis  A dressing was applied and wound care instructions were provided.  [name] [name] tolerated the procedure well and without complications.  The patient will be alert for any signs of cutaneous infection and will follow up as instructed.  Follow up 1 week for suture removal

## 2015-02-21 ENCOUNTER — Encounter: Payer: Self-pay | Admitting: Family Medicine

## 2015-02-21 ENCOUNTER — Ambulatory Visit (INDEPENDENT_AMBULATORY_CARE_PROVIDER_SITE_OTHER): Payer: BLUE CROSS/BLUE SHIELD | Admitting: Family Medicine

## 2015-02-21 VITALS — BP 118/72 | Ht 62.0 in | Wt 162.2 lb

## 2015-02-21 DIAGNOSIS — M25511 Pain in right shoulder: Secondary | ICD-10-CM | POA: Diagnosis not present

## 2015-02-21 MED ORDER — TRAMADOL HCL 50 MG PO TABS: 50.0000 mg | ORAL_TABLET | Freq: Four times a day (QID) | ORAL | Status: DC | PRN

## 2015-02-21 MED ORDER — CHLORZOXAZONE 500 MG PO TABS: 500.0000 mg | ORAL_TABLET | Freq: Three times a day (TID) | ORAL | Status: DC | PRN

## 2015-02-21 NOTE — Progress Notes (Signed)
   Subjective:    Patient ID: [name] [name], female    DOB: Dec 15, 1980, 34 y.o.   MRN: [id number]  HPI  Patient arrives with complaint of right shoulder blade pain since Monday. Pain worse with moving.  Recalls no oinjury  Did not get  Much sleep last night    Pain is worse under the shouldr pain  Took some ibu and tyl  Pt had small cyst   Also had cyst surg this past monday  Review of Systems No headache no chest pain no shortness of breath no abdominal pain no change in bowel habits    Objective:   Physical Exam  Alert vitals stable positive. Scapular tenderness fair range of motion. Lungs clear heart regular in rhythm.      Assessment & Plan:  Impression trapezius/rhomboid strain discussed at length plan local measures discussed. Add chlorzoxazone. Add tramadol when necessary. WSL

## 2015-02-22 ENCOUNTER — Telehealth: Payer: Self-pay | Admitting: *Deleted

## 2015-02-22 NOTE — Telephone Encounter (Signed)
Pt was in tickler file to have u/s on right breast and diagnostic right breast mammo. Pt states she had surgery to remove part of right breast around thanksgiving.

## 2015-02-22 NOTE — Telephone Encounter (Signed)
Ok therefore managed already

## 2015-02-26 ENCOUNTER — Encounter: Payer: Self-pay | Admitting: Obstetrics & Gynecology

## 2015-02-26 ENCOUNTER — Ambulatory Visit (INDEPENDENT_AMBULATORY_CARE_PROVIDER_SITE_OTHER): Payer: BLUE CROSS/BLUE SHIELD | Admitting: Obstetrics & Gynecology

## 2015-02-26 VITALS — BP 120/80 | HR 72 | Wt 160.0 lb

## 2015-02-26 DIAGNOSIS — Z4802 Encounter for removal of sutures: Secondary | ICD-10-CM

## 2015-02-26 NOTE — Progress Notes (Signed)
Patient ID: [name] [name], female   DOB: [birth year], 34 y.o.   MRN: [id number]   Chief Complaint  Patient presents with  . Follow-up    biopsy result.    Blood pressure 120/80, pulse 72, weight 160 lb (72.576 kg), last menstrual period 02/08/2015.   Pt here for suture removal and path report  Pathology: epidermal inclusion cyst, indurated, benign  Incision clean dry intact  Sutures x 4 removed  GV placed  Continue with silver nitrate at home  BID  Follow up prn

## 2015-10-12 ENCOUNTER — Ambulatory Visit (INDEPENDENT_AMBULATORY_CARE_PROVIDER_SITE_OTHER): Payer: BLUE CROSS/BLUE SHIELD | Admitting: Family Medicine

## 2015-10-12 ENCOUNTER — Encounter: Payer: Self-pay | Admitting: Family Medicine

## 2015-10-12 VITALS — BP 118/78 | Temp 98.2°F | Ht 62.0 in | Wt 171.2 lb

## 2015-10-12 DIAGNOSIS — M546 Pain in thoracic spine: Secondary | ICD-10-CM | POA: Diagnosis not present

## 2015-10-12 MED ORDER — CHLORZOXAZONE 500 MG PO TABS: 500.0000 mg | ORAL_TABLET | Freq: Three times a day (TID) | ORAL | Status: DC | PRN

## 2015-10-12 MED ORDER — HYDROCODONE-ACETAMINOPHEN 5-325 MG PO TABS: ORAL_TABLET | ORAL | Status: DC

## 2015-10-12 NOTE — Patient Instructions (Signed)

## 2015-10-12 NOTE — Progress Notes (Signed)
   Subjective:    Patient ID: [name] [name], female    DOB: 23-Dec-1980, 35 y.o.   MRN: [id number]  Back Pain This is a recurrent problem. The current episode started more than 1 month ago. The problem occurs intermittently. Pain location: Shoulder blade  The quality of the pain is described as stabbing. The pain does not radiate. The pain is at a severity of 8/10. The symptoms are aggravated by lying down. She has tried NSAIDs (Voltaren Gel, Advil) for the symptoms.   Patient states no other concerns this visit. Pain quite bad before,  Travel and on the compuyter,  Does work at home with lifting   Now exercising regulaly, tries to walk a lot    Review of Systems  Musculoskeletal: Positive for back pain.   no headache no chest pain no neck pain    Objective:   Physical Exam Alert vitals stable. HEENT normal lungs clear. Heart regular rhythm positive parascapular deep tenderness to palpation right side       Assessment & Plan:  Impression thoracic strain/spasm discussed at length local measures discussed anti-inflammatory medicine prescribed. Muscle spasm S Mann pain medicine prescribed. Long-term stretching exercises encouraged WSL

## 2015-10-21 ENCOUNTER — Emergency Department (HOSPITAL_BASED_OUTPATIENT_CLINIC_OR_DEPARTMENT_OTHER): Payer: BLUE CROSS/BLUE SHIELD

## 2015-10-21 ENCOUNTER — Encounter (HOSPITAL_BASED_OUTPATIENT_CLINIC_OR_DEPARTMENT_OTHER): Payer: Self-pay | Admitting: *Deleted

## 2015-10-21 ENCOUNTER — Emergency Department (HOSPITAL_BASED_OUTPATIENT_CLINIC_OR_DEPARTMENT_OTHER)
Admission: EM | Admit: 2015-10-21 | Discharge: 2015-10-21 | Disposition: A | Discharge status: Home / Self Care | Payer: BLUE CROSS/BLUE SHIELD | Attending: Emergency Medicine | Admitting: Emergency Medicine

## 2015-10-21 DIAGNOSIS — L0292 Furuncle, unspecified: Secondary | ICD-10-CM | POA: Insufficient documentation

## 2015-10-21 DIAGNOSIS — R000 Tachycardia, unspecified: Secondary | ICD-10-CM | POA: Insufficient documentation

## 2015-10-21 DIAGNOSIS — M797 Fibromyalgia: Secondary | ICD-10-CM | POA: Insufficient documentation

## 2015-10-21 DIAGNOSIS — Z79899 Other long term (current) drug therapy: Secondary | ICD-10-CM | POA: Insufficient documentation

## 2015-10-21 DIAGNOSIS — T368X5A Adverse effect of other systemic antibiotics, initial encounter: Secondary | ICD-10-CM | POA: Insufficient documentation

## 2015-10-21 DIAGNOSIS — K219 Gastro-esophageal reflux disease without esophagitis: Secondary | ICD-10-CM | POA: Insufficient documentation

## 2015-10-21 DIAGNOSIS — Z87891 Personal history of nicotine dependence: Secondary | ICD-10-CM | POA: Insufficient documentation

## 2015-10-21 DIAGNOSIS — R011 Cardiac murmur, unspecified: Secondary | ICD-10-CM | POA: Insufficient documentation

## 2015-10-21 DIAGNOSIS — M546 Pain in thoracic spine: Secondary | ICD-10-CM | POA: Insufficient documentation

## 2015-10-21 DIAGNOSIS — T50905A Adverse effect of unspecified drugs, medicaments and biological substances, initial encounter: Secondary | ICD-10-CM

## 2015-10-21 DIAGNOSIS — R110 Nausea: Secondary | ICD-10-CM | POA: Insufficient documentation

## 2015-10-21 DIAGNOSIS — M79606 Pain in leg, unspecified: Secondary | ICD-10-CM | POA: Insufficient documentation

## 2015-10-21 LAB — CBC WITH DIFFERENTIAL/PLATELET
BASOS ABS: 0 10*3/uL (ref 0.0–0.1)
BASOS PCT: 0 %
EOS ABS: 0 10*3/uL (ref 0.0–0.7)
EOS PCT: 0 %
HCT: 38.1 % (ref 36.0–46.0)
Hemoglobin: 13 g/dL (ref 12.0–15.0)
Lymphocytes Relative: 8 %
Lymphs Abs: 0.8 10*3/uL (ref 0.7–4.0)
MCH: 29.3 pg (ref 26.0–34.0)
MCHC: 34.1 g/dL (ref 30.0–36.0)
MCV: 86 fL (ref 78.0–100.0)
MONO ABS: 0.5 10*3/uL (ref 0.1–1.0)
MONOS PCT: 4 %
Neutro Abs: 9.3 10*3/uL — ABNORMAL HIGH (ref 1.7–7.7)
Neutrophils Relative %: 88 %
PLATELETS: 255 10*3/uL (ref 150–400)
RBC: 4.43 MIL/uL (ref 3.87–5.11)
RDW: 12.1 % (ref 11.5–15.5)
WBC: 10.7 10*3/uL — ABNORMAL HIGH (ref 4.0–10.5)

## 2015-10-21 LAB — COMPREHENSIVE METABOLIC PANEL
ALBUMIN: 4 g/dL (ref 3.5–5.0)
ALK PHOS: 48 U/L (ref 38–126)
ALT: 26 U/L (ref 14–54)
AST: 30 U/L (ref 15–41)
Anion gap: 8 (ref 5–15)
BILIRUBIN TOTAL: 1 mg/dL (ref 0.3–1.2)
BUN: 8 mg/dL (ref 6–20)
CALCIUM: 8.9 mg/dL (ref 8.9–10.3)
CO2: 22 mmol/L (ref 22–32)
CREATININE: 0.77 mg/dL (ref 0.44–1.00)
Chloride: 103 mmol/L (ref 101–111)
GFR calc Af Amer: >60 mL/min (ref >60)
GFR calc non Af Amer: >60 mL/min (ref >60)
GLUCOSE: 113 mg/dL — AB (ref 65–99)
Potassium: 3.6 mmol/L (ref 3.5–5.1)
SODIUM: 133 mmol/L — AB (ref 135–145)
Total Protein: 7.6 g/dL (ref 6.5–8.1)

## 2015-10-21 LAB — I-STAT CG4 LACTIC ACID, ED: LACTIC ACID, VENOUS: 0.82 mmol/L (ref 0.5–2.0)

## 2015-10-21 IMAGING — CR DG CHEST 2V
2 series · 2 of 2 positions shown · non-contrast
Comparison: Prior radiograph from [DATE].

CLINICAL DATA: Initial evaluation for acute fever.

EXAM:
CHEST  2 VIEW

[w chest pa]
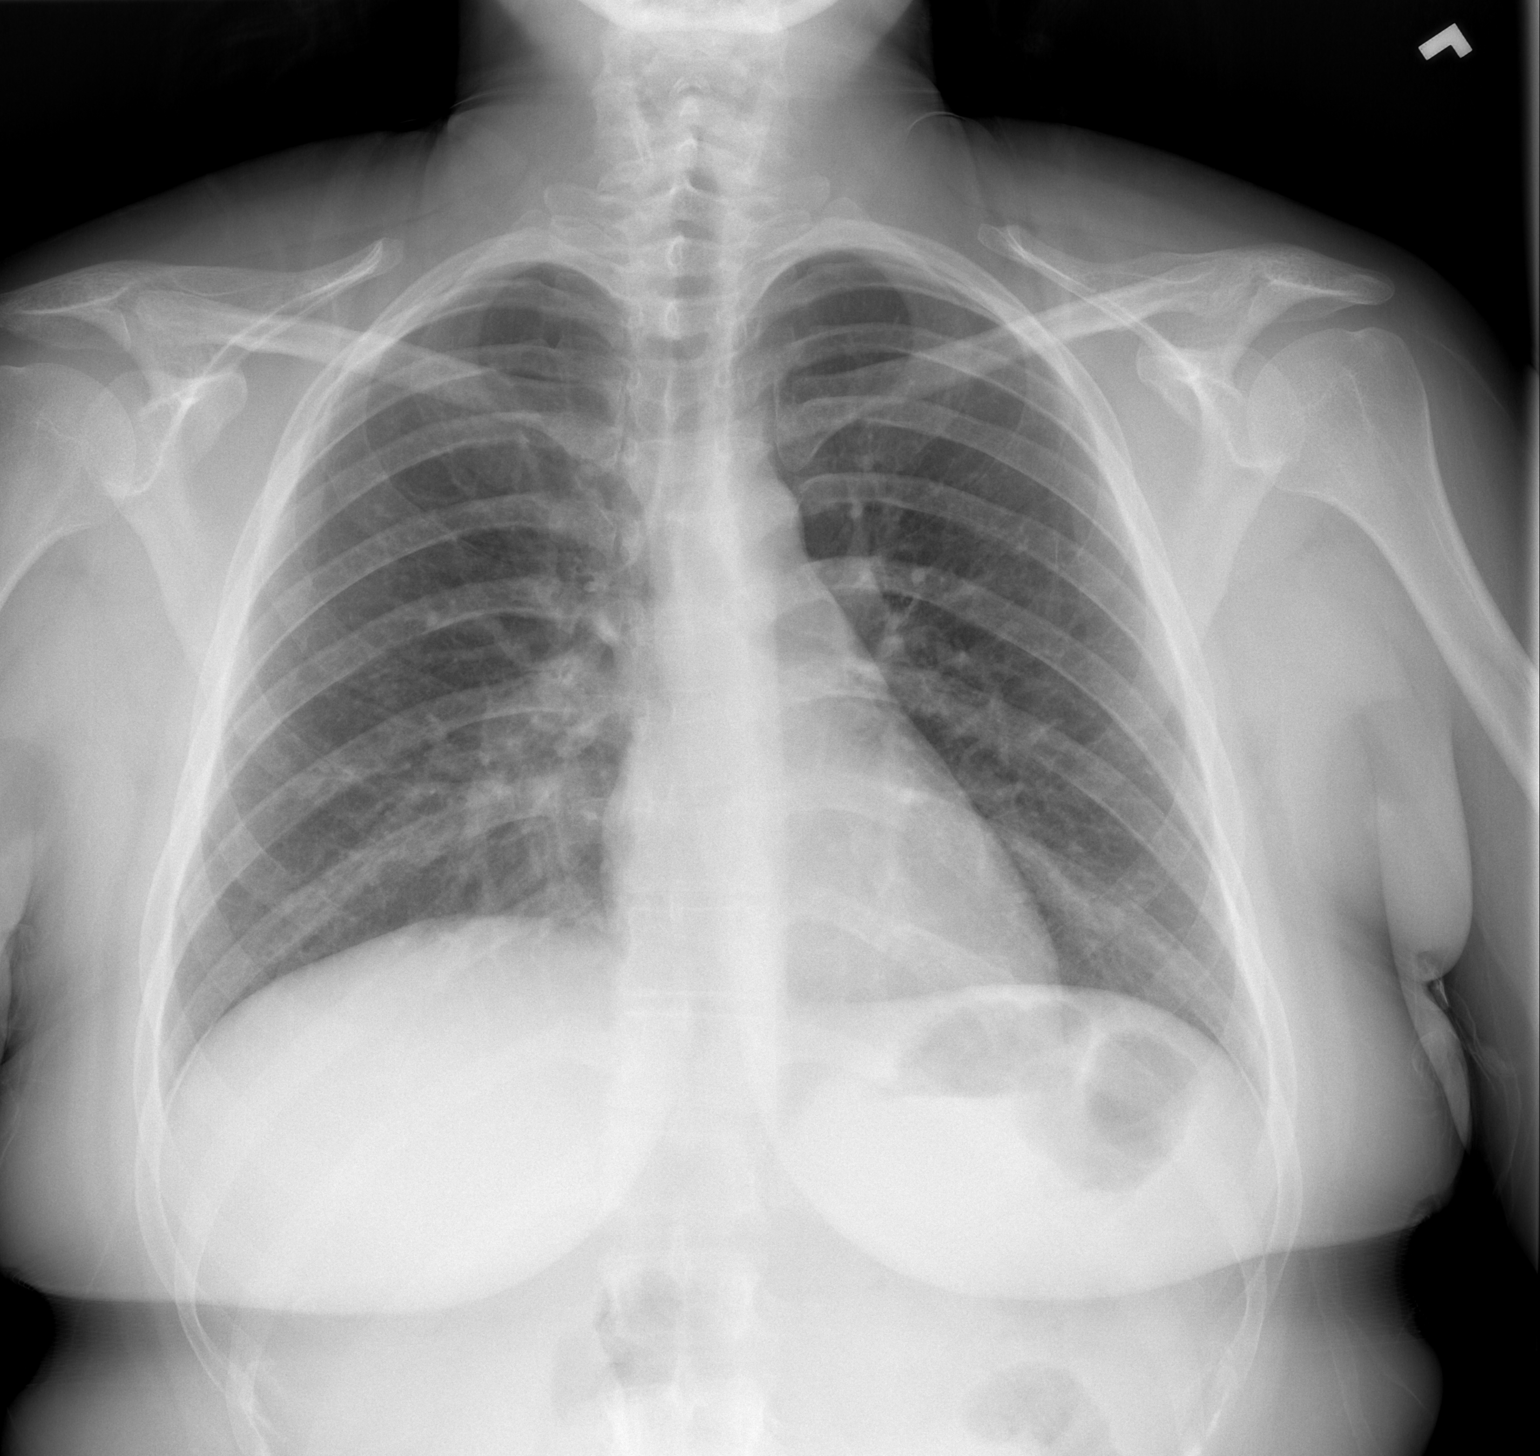

[w chest lat]
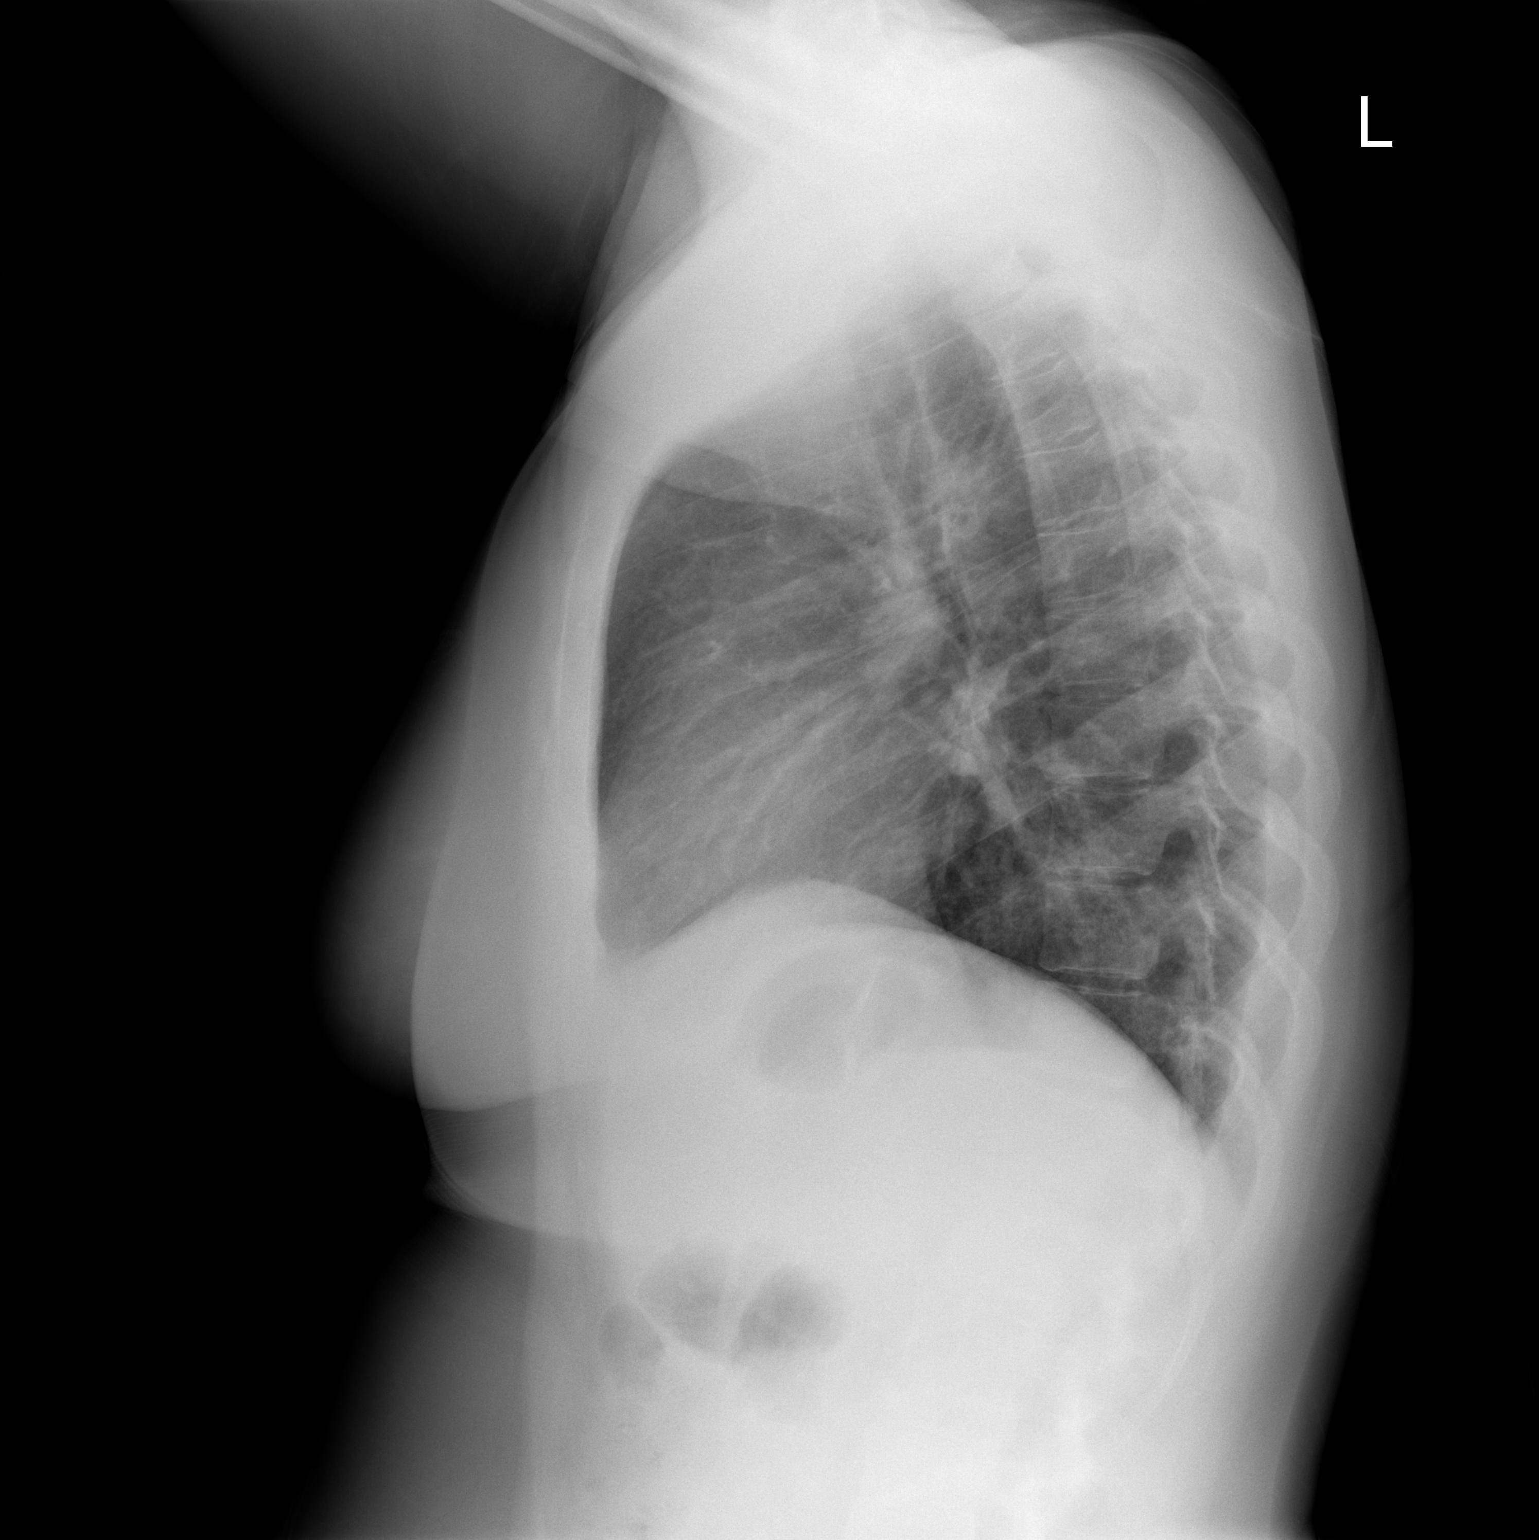

[2 of 2 positions shown; findings below may reference images not displayed]

FINDINGS: Cardiac and mediastinal silhouettes are within normal limits.
Tracheal air column midline and patent.

Lungs are normally inflated. Patchy opacity present at the medial
right lung base. Additional streaky opacity present at the left lung
base. Findings are nonspecific, but could reflect atelectasis or
infiltrates. No pulmonary edema or pleural effusion. No
pneumothorax.

No acute osseus abnormality.
IMPRESSION: Patchy bibasilar opacities, right greater than left. While these
findings may in part reflect atelectasis, possible infiltrates could
be considered in the setting of fever.

## 2015-10-21 MED ORDER — SODIUM CHLORIDE 0.9 % IV BOLUS (SEPSIS)
1000.0000 mL | Freq: Once | INTRAVENOUS | Status: AC
  Administered 2015-10-21: 1000 mL via INTRAVENOUS

## 2015-10-21 MED ORDER — DOXYCYCLINE HYCLATE 100 MG PO CAPS: 100.0000 mg | ORAL_CAPSULE | Freq: Two times a day (BID) | ORAL | Status: DC

## 2015-10-21 MED ORDER — ONDANSETRON HCL 4 MG/2ML IJ SOLN: 4.0000 mg | Freq: Once | INTRAMUSCULAR | Status: DC

## 2015-10-21 MED ORDER — ACETAMINOPHEN 500 MG PO TABS
1000.0000 mg | ORAL_TABLET | Freq: Once | ORAL | Status: AC | PRN
Start: 2015-10-21 — End: 2015-10-21
  Administered 2015-10-21: 1000 mg via ORAL
  Filled 2015-10-21: qty 2

## 2015-10-21 MED ORDER — ONDANSETRON 4 MG PO TBDP
4.0000 mg | ORAL_TABLET | Freq: Once | ORAL | Status: AC
  Administered 2015-10-21: 4 mg via ORAL
  Filled 2015-10-21: qty 1

## 2015-10-21 NOTE — ED Notes (Addendum)
Pt on sulfa antibiotics x 2 days.  States that her legs feel tight, jittery.  Denies SOB.  Reports that her throat feels funny but that she has a post-nasal drip.  No stridor present.  Symptoms with medication reaction previously.

## 2015-10-21 NOTE — ED Provider Notes (Signed)
CSN: [id number]     Arrival date & time 10/21/15  1710 History   By signing my name below, I, Altamease Oiler, attest that this documentation has been prepared under the direction and in the presence of Davonna Belling, MD. Electronically Signed: Altamease Oiler, ED Scribe. 10/21/2015. 6:50 PM   Chief Complaint  Patient presents with  . Allergic Reaction   The history is provided by the patient. No language interpreter was used.   [name] [name] is a 35 y.o. female who presents to the Emergency Department complaining of constant heavy/tight/jittery leg discomfort  with sudden onset this afternoon. Pt associates her symptoms with the Bactrim this morning for a abscess at her back because she has had a similar reaction after septra in the past. Pt was not as concerned after the first reaction because she had pneumonia at that time and thought that may be a part of her discomfort. Associated symptoms include nausea. Pt denies vomiting, SOB, and difficulty swallowing.   Past Medical History  Diagnosis Date  . Colitis     no current problems  . GERD (gastroesophageal reflux disease)   . Fibromyalgia   . Heart murmur     states no known problem  . Breast mass, right 07/2014  . Boil 11/20/2014  . Sebaceous cyst 11/20/2014   Past Surgical History  Procedure Laterality Date  . Wisdom tooth extraction    . Colposcopy      x 2  . Laparoscopic tubal ligation Bilateral 06/15/2013    Procedure: LAPAROSCOPIC BILATERAL TUBAL LIGATION WITH CAUTERY;  Surgeon: Florian Buff, MD;  Location: [facility];  Service: Gynecology;  Laterality: Bilateral;  . Radioactive seed guided excisional breast biopsy Right 08/03/2014    Procedure: RIGHT BREAST SEED GUIDED EXCISION;  Surgeon: Rolm Bookbinder, MD;  Location: [facility];  Service: General;  Laterality: Right;  . Tubal ligation     Family History  Problem Relation Age of Onset  . COPD Father   . Diabetes Father   . Heart disease Father   .  Diverticulosis Father   . Diabetes Sister   . Leukemia Sister   . Diabetes Other   . Cancer Other   . Thyroid disease Other   . Other Mother     shingles   Social History  Substance Use Topics  . Smoking status: Former Smoker -- 0.00 packs/day for 0 years    Types: Cigarettes    Quit date: 12/02/2007  . Smokeless tobacco: Never Used  . Alcohol Use: No   OB History    Gravida Para Term Preterm AB TAB SAB Ectopic Multiple Living   1 1 1       1      Review of Systems  HENT: Negative for trouble swallowing.   Respiratory: Negative for shortness of breath.   Gastrointestinal: Positive for nausea.  Musculoskeletal:       Leg discomfort  Skin:       Abscess at the back  All other systems reviewed and are negative.   Allergies  Cortisone; Cefdinir; Flexeril; and Septra ds  Home Medications   Prior to Admission medications   Medication Sig Start Date End Date Taking? Authorizing Provider  chlorzoxazone (PARAFON FORTE DSC) 500 MG tablet Take 1 tablet (500 mg total) by mouth 3 (three) times daily as needed for muscle spasms. 10/12/15   Mikey Kirschner, MD  diclofenac sodium (VOLTAREN) 1 % GEL Apply topically 4 (four) times daily.    Historical  Provider, MD  doxycycline (VIBRAMYCIN) 100 MG capsule Take 1 capsule (100 mg total) by mouth 2 (two) times daily. 10/21/15   Davonna Belling, MD  EPINEPHrine (EPIPEN 2-PAK) 0.3 mg/0.3 mL IJ SOAJ injection Inject into the muscle once.    Historical Provider, MD  HYDROcodone-acetaminophen (NORCO/VICODIN) 5-325 MG tablet Take 1 tablet by mouth every 4-6 hours as needed for pain 10/12/15   Mikey Kirschner, MD  ibuprofen (ADVIL,MOTRIN) 200 MG tablet Take 200 mg by mouth every 6 (six) hours as needed.    Historical Provider, MD  Melatonin 1 MG TABS Take by mouth as needed.     Historical Provider, MD  ranitidine (ZANTAC) 150 MG tablet Take 150 mg by mouth 2 (two) times daily.    Historical Provider, MD  traMADol (ULTRAM) 50 MG tablet Take 1 tablet  (50 mg total) by mouth every 6 (six) hours as needed. Patient not taking: Reported on 02/26/2015 02/21/15   Mikey Kirschner, MD   BP 93/60 mmHg  Pulse 102  Temp(Src) 99.8 F (37.7 C) (Oral)  Resp 16  Ht 5\' 2"  (1.575 m)  Wt 160 lb (72.576 kg)  BMI 29.26 kg/m2  SpO2 100%  LMP 10/17/2015 Physical Exam  Constitutional: She is oriented to person, place, and time. She appears well-developed and well-nourished. No distress.  HENT:  Head: Normocephalic and atraumatic.  Eyes: EOM are normal.  Neck: Normal range of motion.  Cardiovascular: Regular rhythm.   Mild tachycardia  Pulmonary/Chest: Effort normal and breath sounds normal.  CTAB  Abdominal: Soft. She exhibits no distension. There is no tenderness.  Musculoskeletal: Normal range of motion.  Legs are non tender to palpation  Neurological: She is alert and oriented to person, place, and time.  Skin: Skin is warm and dry.  Warm to touch 1 cm tender indurated area at the mid back with no active drainage  Psychiatric: She has a normal mood and affect. Judgment normal.  Nursing note and vitals reviewed.   ED Course  Procedures (including critical care time) COORDINATION OF CARE: 6:47 PM Discussed treatment plan which includes Zofran and observation in the ED with pt at bedside and pt agreed to plan.  Labs Review Labs Reviewed  COMPREHENSIVE METABOLIC PANEL - Abnormal; Notable for the following:    Sodium 133 (*)    Glucose, Bld 113 (*)    All other components within normal limits  CBC WITH DIFFERENTIAL/PLATELET - Abnormal; Notable for the following:    WBC 10.7 (*)    Neutro Abs 9.3 (*)    All other components within normal limits  I-STAT CG4 LACTIC ACID, ED    Imaging Review Dg Chest 2 View  10/21/2015  CLINICAL DATA:  Initial evaluation for acute fever. EXAM: CHEST  2 VIEW COMPARISON:  Prior radiograph from 11/21/2014. FINDINGS: Cardiac and mediastinal silhouettes are within normal limits. Tracheal air column midline and  patent. Lungs are normally inflated. Patchy opacity present at the medial right lung base. Additional streaky opacity present at the left lung base. Findings are nonspecific, but could reflect atelectasis or infiltrates. No pulmonary edema or pleural effusion. No pneumothorax. No acute osseus abnormality. IMPRESSION: Patchy bibasilar opacities, right greater than left. While these findings may in part reflect atelectasis, possible infiltrates could be considered in the setting of fever. Electronically Signed   By: Jeannine Boga M.D.   On: 10/21/2015 21:04     EKG Interpretation None      MDM   Final diagnoses:  Medication reaction, initial  encounter  Boil   patient presents with muscle aches and nausea after taking Bactrim. She's had a similar reaction in the past. She is taking for abscess on her back. While in the ER she developed a fever. She had had a similar episode in the past when she had a fever and infiltrate on x-ray after taking Bactrim. X-ray showed possible infiltrate but this may be reaction to the medication. She has not been coughing before this. She was on it for a skin abscess not URI type symptoms. Will however give doxycycline to treat the abscess which would also cover pneumonia. Will not use steroids at this time. Likely reaction to the Bactrim and she should probably avoid this in the future. Will discharge home.  I personally performed the services described in this documentation, which was scribed in my presence. The recorded information has been reviewed and is accurate.     Davonna Belling, MD 10/21/15 [phone]

## 2015-10-21 NOTE — Discharge Instructions (Signed)
Drug Allergy Allergic reactions to medicines are common. Some allergic reactions are mild. A delayed type of drug allergy that occurs 1 week or more after exposure to a medicine or vaccine is called serum sickness. A life-threatening, sudden (acute) allergic reaction that involves the whole body is called anaphylaxis. CAUSES  "True" drug allergies occur when there is an allergic reaction to a medicine. This is caused by overactivity of the immune system. First, the body becomes sensitized. The immune system is triggered by your first exposure to the medicine. Following this first exposure, future exposure to the same medicine may be life-threatening. Almost any medicine can cause an allergic reaction. Common ones are:  Penicillin.  Sulfonamides (sulfa drugs).  Local anesthetics.  X-ray dyes that contain iodine. SYMPTOMS  Common symptoms of a minor allergic reaction are:  Swelling around the mouth.  An itchy red rash or hives.  Vomiting or diarrhea. Anaphylaxis can cause swelling of the mouth and throat. This makes it difficult to breathe and swallow. Severe reactions can be fatal within seconds, even after exposure to only a trace amount of the drug that causes the reaction. HOME CARE INSTRUCTIONS  If you are unsure of what caused your reaction, write down:  The names of the medicines you took.  How much medicine you took.  How you took the medicine, such as whether you took a pill, injected the medicine, or applied it to your skin.  All of the things you ate and drank.  The date and time of your reaction.  The symptoms of the reaction.  You may want to follow up with an allergy specialist after the reaction has cleared in order to be tested to confirm the allergy. It is important to confirm that your reaction is an allergy, not just a side effect to the medicine. If you have a true allergy to a medicine, this may prevent that medicine and related medicines from being given to  you when you are very ill.  If you have hives or a rash:  Take medicines as directed by your caregiver.  You may use an over-the-counter antihistamine (diphenhydramine) as needed.  Apply cold compresses to the skin or take baths in cool water. Avoid hot baths or showers.  If you are severely allergic:  Continuous observation after a severe reaction may be needed. Hospitalization is often required.  Wear a medical alert bracelet or necklace stating your allergy.  You and your family must learn how to use an anaphylaxis kit or give an epinephrine injection to temporarily treat an emergency allergic reaction. If you have had a severe reaction, always carry your epinephrine injection or anaphylaxis kit with you. This can be lifesaving if you have a severe reaction.  Do not drive or perform tasks after treatment until the medicines used to treat your reaction have worn off, or until your caregiver says it is okay.  If you have a drug allergy that was confirmed by your health care provider:  Carry information about the drug allergy with you at all times.  Always check with a pharmacist before taking any over-the-counter medicine. SEEK MEDICAL CARE IF:   You think you had an allergic reaction. Symptoms usually start within 30 minutes after exposure.  Symptoms are getting worse rather than better.  You develop new symptoms.  The symptoms that brought you to your caregiver return. SEEK IMMEDIATE MEDICAL CARE IF:   You have swelling of the mouth, difficulty breathing, or wheezing.  You have a tight  feeling in your chest or throat.  You develop hives, swelling, or itching all over your body.  You develop severe vomiting or diarrhea.  You feel faint or pass out. This is an emergency. Use your epinephrine injection or anaphylaxis kit as you have been instructed. Call for emergency medical help. Even if you improve after the injection, you need to be examined at a hospital emergency  department. MAKE SURE YOU:   Understand these instructions.  Will watch your condition.  Will get help right away if you are not doing well or get worse.   This information is not intended to replace advice given to you by your health care provider. Make sure you discuss any questions you have with your health care provider.   Document Released: 09/01/2005 Document Revised: 09/22/2014 Document Reviewed: 04/03/2015 Elsevier Interactive Patient Education 2016 [employer]  Abscess An abscess is an infected area that contains a collection of pus and debris.It can occur in almost any part of the body. An abscess is also known as a furuncle or boil. CAUSES  An abscess occurs when tissue gets infected. This can occur from blockage of oil or sweat glands, infection of hair follicles, or a minor injury to the skin. As the body tries to fight the infection, pus collects in the area and creates pressure under the skin. This pressure causes pain. People with weakened immune systems have difficulty fighting infections and get certain abscesses more often.  SYMPTOMS Usually an abscess develops on the skin and becomes a painful mass that is red, warm, and tender. If the abscess forms under the skin, you may feel a moveable soft area under the skin. Some abscesses break open (rupture) on their own, but most will continue to get worse without care. The infection can spread deeper into the body and eventually into the bloodstream, causing you to feel ill.  DIAGNOSIS  Your caregiver will take your medical history and perform a physical exam. A sample of fluid may also be taken from the abscess to determine what is causing your infection. TREATMENT  Your caregiver may prescribe antibiotic medicines to fight the infection. However, taking antibiotics alone usually does not cure an abscess. Your caregiver may need to make a small cut (incision) in the abscess to drain the pus. In some cases, gauze is packed  into the abscess to reduce pain and to continue draining the area. HOME CARE INSTRUCTIONS   Only take over-the-counter or prescription medicines for pain, discomfort, or fever as directed by your caregiver.  If you were prescribed antibiotics, take them as directed. Finish them even if you start to feel better.  If gauze is used, follow your caregiver's directions for changing the gauze.  To avoid spreading the infection:  Keep your draining abscess covered with a bandage.  Wash your hands well.  Do not share personal care items, towels, or whirlpools with others.  Avoid skin contact with others.  Keep your skin and clothes clean around the abscess.  Keep all follow-up appointments as directed by your caregiver. SEEK MEDICAL CARE IF:   You have increased pain, swelling, redness, fluid drainage, or bleeding.  You have muscle aches, chills, or a general ill feeling.  You have a fever. MAKE SURE YOU:   Understand these instructions.  Will watch your condition.  Will get help right away if you are not doing well or get worse.   This information is not intended to replace advice given to you by your health  care provider. Make sure you discuss any questions you have with your health care provider.   Document Released: 06/11/2005 Document Revised: 03/02/2012 Document Reviewed: 11/14/2011 Elsevier Interactive Patient Education [employer].

## 2015-10-21 NOTE — ED Notes (Addendum)
Reports a little dizziness and a little nausea. (Denies: pain, sob), alert, NAD, calm, interactive, no dyspnea noted, skin W&D, family at [facility].

## 2016-01-25 ENCOUNTER — Other Ambulatory Visit: Payer: Self-pay | Admitting: Adult Health

## 2016-06-19 ENCOUNTER — Ambulatory Visit (HOSPITAL_COMMUNITY)
Admission: RE | Admit: 2016-06-19 | Discharge: 2016-06-19 | Disposition: A | Discharge status: Home / Self Care | Payer: BLUE CROSS/BLUE SHIELD | Source: Ambulatory Visit | Attending: Family Medicine | Admitting: Family Medicine

## 2016-06-19 ENCOUNTER — Ambulatory Visit (INDEPENDENT_AMBULATORY_CARE_PROVIDER_SITE_OTHER): Payer: BLUE CROSS/BLUE SHIELD | Admitting: Family Medicine

## 2016-06-19 ENCOUNTER — Encounter: Payer: Self-pay | Admitting: Family Medicine

## 2016-06-19 VITALS — BP 112/80 | Ht 62.0 in | Wt 162.1 lb

## 2016-06-19 DIAGNOSIS — M79674 Pain in right toe(s): Secondary | ICD-10-CM | POA: Insufficient documentation

## 2016-06-19 IMAGING — DX DG TOE GREAT 2+V*R*
3 series · 3 of 3 positions shown · non-contrast
Comparison: None.

CLINICAL DATA: She dropped a pallet on her big toe yesterday around
5p. Pain, bruising and abrasion on toe. She says she cant move it.

EXAM:
RIGHT GREAT TOE

[toe ap]
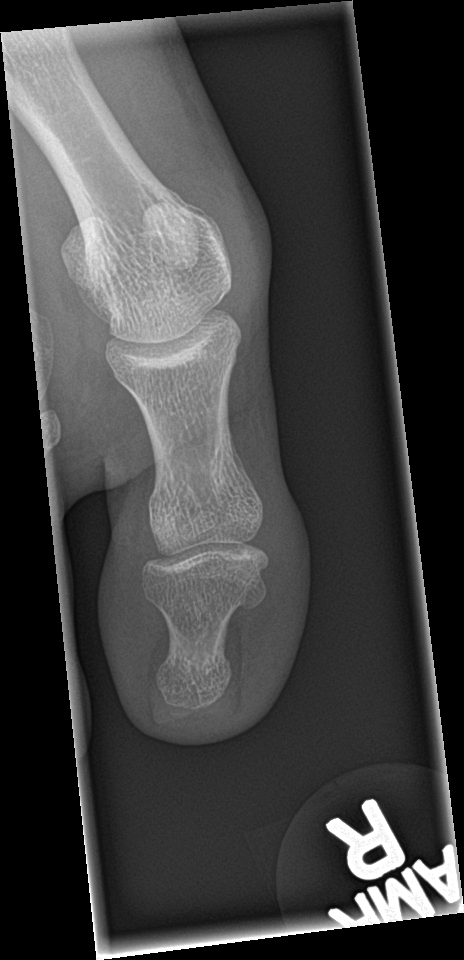

[toe obl]
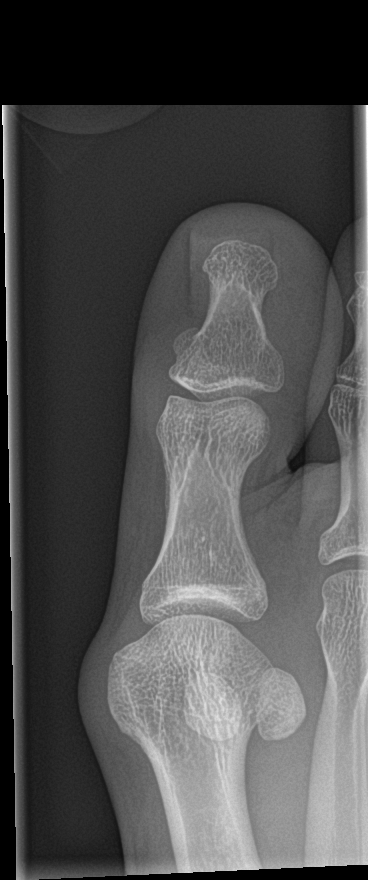

[toe lat]
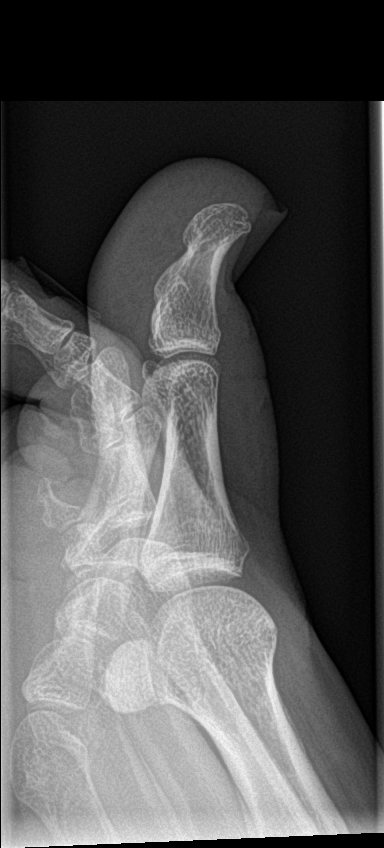

[3 of 3 positions shown; findings below may reference images not displayed]

FINDINGS: There is no evidence of fracture or dislocation. There is no
evidence of arthropathy or other focal bone abnormality. Soft
tissues are unremarkable.
IMPRESSION: Negative.

## 2016-06-19 NOTE — Progress Notes (Signed)
   Subjective:    Patient ID: [name] [name], female    DOB: [birth year], 35 y.o.   MRN: [id number]  Toe Pain   The incident occurred 12 to 24 hours ago. The injury mechanism was a direct blow. The pain is present in the right toes. The quality of the pain is described as aching. The pain is moderate. The pain has been intermittent since onset. Associated symptoms include an inability to bear weight and numbness. She reports no foreign bodies present. The symptoms are aggravated by movement. She has tried ice and NSAIDs for the symptoms. The treatment provided no relief.  Patient notes mid foot pain after walking a lot Patient has no other concerns at this time.  Patient dropped hard palate on her toe notes distal numbness and pain with walking Review of Systems  Neurological: Positive for numbness.       Objective:   Physical Exam  Alert vitals stable, NAD. Blood pressure good on repeat. HEENT normal. Lungs clear. Heart regular rate and rhythm. Distal toe contusion evident.  Abrasion evident positive proximal phalangeal tenderness. Distal sensation intact but paresthetica in nature    Assessment & Plan:  Impression neuropraxia discussed expect resolution x-ray ordered x-ray is negative local measures discussed expect gradual improvement

## 2019-02-12 ENCOUNTER — Other Ambulatory Visit: Payer: Self-pay

## 2019-02-12 ENCOUNTER — Emergency Department (HOSPITAL_BASED_OUTPATIENT_CLINIC_OR_DEPARTMENT_OTHER)
Admission: EM | Admit: 2019-02-12 | Discharge: 2019-02-13 | Disposition: A | Discharge status: Home / Self Care | Payer: PRIVATE HEALTH INSURANCE | Attending: Emergency Medicine | Admitting: Emergency Medicine

## 2019-02-12 ENCOUNTER — Encounter (HOSPITAL_BASED_OUTPATIENT_CLINIC_OR_DEPARTMENT_OTHER): Payer: Self-pay | Admitting: Emergency Medicine

## 2019-02-12 DIAGNOSIS — Y939 Activity, unspecified: Secondary | ICD-10-CM | POA: Insufficient documentation

## 2019-02-12 DIAGNOSIS — Z23 Encounter for immunization: Secondary | ICD-10-CM | POA: Insufficient documentation

## 2019-02-12 DIAGNOSIS — Y92008 Other place in unspecified non-institutional (private) residence as the place of occurrence of the external cause: Secondary | ICD-10-CM | POA: Insufficient documentation

## 2019-02-12 DIAGNOSIS — Y999 Unspecified external cause status: Secondary | ICD-10-CM | POA: Insufficient documentation

## 2019-02-12 DIAGNOSIS — S91331A Puncture wound without foreign body, right foot, initial encounter: Secondary | ICD-10-CM | POA: Insufficient documentation

## 2019-02-12 DIAGNOSIS — Z79899 Other long term (current) drug therapy: Secondary | ICD-10-CM | POA: Diagnosis not present

## 2019-02-12 DIAGNOSIS — W5911XA Bitten by nonvenomous snake, initial encounter: Secondary | ICD-10-CM | POA: Insufficient documentation

## 2019-02-12 DIAGNOSIS — Z87891 Personal history of nicotine dependence: Secondary | ICD-10-CM | POA: Insufficient documentation

## 2019-02-12 DIAGNOSIS — S99921A Unspecified injury of right foot, initial encounter: Secondary | ICD-10-CM | POA: Diagnosis present

## 2019-02-12 MED ORDER — ACETAMINOPHEN 500 MG PO TABS
1000.0000 mg | ORAL_TABLET | Freq: Once | ORAL | Status: AC
  Administered 2019-02-12: 1000 mg via ORAL
  Filled 2019-02-12: qty 2

## 2019-02-12 MED ORDER — TETANUS-DIPHTH-ACELL PERTUSSIS 5-2.5-18.5 LF-MCG/0.5 IM SUSP
0.5000 mL | Freq: Once | INTRAMUSCULAR | Status: AC
  Administered 2019-02-12: 0.5 mL via INTRAMUSCULAR
  Filled 2019-02-12: qty 0.5

## 2019-02-12 NOTE — ED Notes (Addendum)
Spoke to Christmas, with posion control. Will fax measurement sheet over. Tdap to be administered. Elevation recommended. No ice to site. Coagulation to be obtained 6hrs after arrival.

## 2019-02-12 NOTE — ED Notes (Signed)
Foot 22cm Ankle 23cm Calf 42cm Thigh 51cm  Pt has full ROM but reports tenderness and pain.

## 2019-02-12 NOTE — ED Notes (Signed)
Foot 23 cm Ankle 23 cm Calf 40 cm Thigh 48.5 cm  Continues to report soreness/pain, tylenol not effective. CMS intact, denies groin tenderness. Will continue to monitor

## 2019-02-12 NOTE — ED Provider Notes (Signed)
[facility] EMERGENCY DEPARTMENT Provider Note   CSN: [id number] Arrival date & time: 02/12/19  2110    History   Chief Complaint Chief Complaint  Patient presents with  . Snake Bite    HPI [name] [name] here presenting with possible snake bite.  Patient states that around 8:30 PM, she was and her driveway and got out of her car and felt something bit her right heel area.  She then saw a snake there.  She was concerned that she may have a snakebite.  She has some pain and swelling on the bite site.  She states that she is otherwise healthy but did not know when her last tetanus shot was.  Patient control was called by the nurse and was recommended to get serial measurements of the foot and ankle and calf and thigh as well as 6-hour coagulation labs.      The history is provided by the patient.    Past Medical History:  Diagnosis Date  . Boil 11/20/2014  . Breast mass, right 07/2014  . Colitis    no current problems  . Fibromyalgia   . GERD (gastroesophageal reflux disease)   . Heart murmur    states no known problem  . Sebaceous cyst 11/20/2014    Patient Active Problem List   Diagnosis Date Noted  . Boil 11/20/2014  . Sebaceous cyst 11/20/2014  . Migraine headache with aura 09/19/2013  . Consultation for sterilization 06/01/2013  . Abnormal uterine bleeding (AUB) 05/11/2013  . BV (bacterial vaginosis) 05/11/2013    Past Surgical History:  Procedure Laterality Date  . COLPOSCOPY     x 2  . LAPAROSCOPIC TUBAL LIGATION Bilateral 06/15/2013   Procedure: LAPAROSCOPIC BILATERAL TUBAL LIGATION WITH CAUTERY;  Surgeon: Florian Buff, MD;  Location: [facility];  Service: Gynecology;  Laterality: Bilateral;  . RADIOACTIVE SEED GUIDED EXCISIONAL BREAST BIOPSY Right 08/03/2014   Procedure: RIGHT BREAST SEED GUIDED EXCISION;  Surgeon: Rolm Bookbinder, MD;  Location: [facility];  Service: General;  Laterality: Right;  . TUBAL LIGATION     . WISDOM TOOTH EXTRACTION       OB History    Gravida  1   Para  1   Term  1   Preterm      AB      Living  1     SAB      TAB      Ectopic      Multiple      Live Births               Home Medications    Prior to Admission medications   Medication Sig Start Date End Date Taking? Authorizing Provider  diclofenac sodium (VOLTAREN) 1 % GEL Apply topically 4 (four) times daily.    [provider]  EPINEPHrine (EPIPEN 2-PAK) 0.3 mg/0.3 mL IJ SOAJ injection Inject into the muscle once.    [provider]  ibuprofen (ADVIL,MOTRIN) 200 MG tablet Take 200 mg by mouth every 6 (six) hours as needed.    [provider]  Melatonin 1 MG TABS Take by mouth as needed.     [provider]  ranitidine (ZANTAC) 150 MG tablet Take 150 mg by mouth 2 (two) times daily.    [provider]    Family History Family History  Problem Relation Age of Onset  . COPD Father   . Diabetes Father   . Heart  disease Father   . Diverticulosis Father   . Diabetes Sister   . Leukemia Sister   . Diabetes Other   . Cancer Other   . Thyroid disease Other   . Other Mother        shingles    Social History Social History   Tobacco Use  . Smoking status: Former Smoker    Packs/day: 0.00    Years: 0.00    Pack years: 0.00    Types: Cigarettes    Last attempt to quit: 12/02/2007    Years since quitting: 11.2  . Smokeless tobacco: Never Used  Substance Use Topics  . Alcohol use: No  . Drug use: No     Allergies   Cortisone; Cefdinir; Flexeril [cyclobenzaprine]; and Septra ds [sulfamethoxazole-trimethoprim]   Review of Systems Review of Systems  Skin: Positive for wound.  All other systems reviewed and are negative.    Physical Exam Updated Vital Signs BP (!) 148/103   Pulse (!) 111   Temp 98 F (36.7 C) (Oral)   Resp 20   Ht 5\' 2"  (1.575 m)   Wt 83.9 kg   LMP 01/29/2019   SpO2 100%   BMI 33.84 kg/m   Physical Exam  Vitals signs and nursing note reviewed.  HENT:     Head: Normocephalic.     Nose: Nose normal.     Mouth/Throat:     Mouth: Mucous membranes are moist.  Eyes:     Pupils: Pupils are equal, round, and reactive to light.  Neck:     Musculoskeletal: Normal range of motion.  Cardiovascular:     Rate and Rhythm: Normal rate.     Pulses: Normal pulses.  Pulmonary:     Effort: Pulmonary effort is normal.  Abdominal:     General: Abdomen is flat.  Musculoskeletal:     Comments: Small puncture wound R heel, no obvious local swelling or erythema. Neurovascular intact R leg.   Skin:    General: Skin is warm.     Capillary Refill: Capillary refill takes less than 2 seconds.  Neurological:     General: No focal deficit present.     Mental Status: She is alert.  Psychiatric:        Mood and Affect: Mood normal.      ED Treatments / Results  Labs (all labs ordered are listed, but only abnormal results are displayed) Labs Reviewed - No data to display  EKG None  Radiology No results found.  Procedures Procedures (including critical care time)  Medications Ordered in ED Medications  Tdap (BOOSTRIX) injection 0.5 mL (0.5 mLs Intramuscular Given 02/12/19 2140)  acetaminophen (TYLENOL) tablet 1,000 mg (1,000 mg Oral Given 02/12/19 2144)     Initial Impression / Assessment and Plan / ED Course  I have reviewed the triage vital signs and the nursing notes.  Pertinent labs & imaging results that were available during my care of the patient were reviewed by me and considered in my medical decision making (see chart for details).       [name] [name] here with possible snake bite. Poison control was consulted and recommend tdap, serial measurements and coagulation labs at 6 hours after arrival.  Patient is neurovascular intact right now.   11:17 PM Labs due at 3 am. Signed out to Dr. Leonides Schanz.    Final Clinical Impressions(s) / ED Diagnoses   Final  diagnoses:  None    ED Discharge Orders  None       Drenda Freeze, MD 02/12/19 [phone]

## 2019-02-12 NOTE — ED Notes (Signed)
Foot 22cm  Ankle 23cm  Calf 42cm  Thigh 51cm

## 2019-02-12 NOTE — ED Provider Notes (Signed)
11:00 PM  Assumed care from Dr. Darl Householder.  Patient is 38 yo F who was possibly bit by snake at 8:30pm to right heel.  Labs ordered for 3am.    12:45 AM  Patient has no significant swelling noted.  She does have some ecchymosis to the medial ankle.  Has some calf tenderness now but no swelling or ecchymosis.  2+ right DP pulse noted.  No systemic symptoms.  Hemodynamically stable.  Tylenol did not improve her symptoms.  States pain is worsening.  Will give dose of oxycodone with Zofran.  Her husband drove her here.  She is aware of plan for labs at 3 AM.  2:15 AM  Pt reports pain has improved.  She is having increasing ecchymosis to the medial ankle and medial calf.  No significant change in measurements.  Please see nursing notes for measurements.  4:20 AM  No significant change in patient's measurements.  Her INR, platelet count normal.  Fibrinogen has been curried over to [employer].  I have talked to [facility] with poison control who states we do not need to wait for this lab to come back.  Patient would like to be discharged home.  Will provide with prescription for Percocet for pain control.  Discussed importance of elevation without ice and rest.  Discussed return precautions.  Provided her with poison control follow-up information.  Patient comfortable with this plan.  Will give crutches to help with ambulation.   At this time, I do not feel there is any life-threatening condition present. I have reviewed and discussed all results (EKG, imaging, lab, urine as appropriate) and exam findings with patient/family. I have reviewed nursing notes and appropriate previous records.  I feel the patient is safe to be discharged home without further emergent workup and can continue workup as an outpatient as needed. Discussed usual and customary return precautions. Patient/family verbalize understanding and are comfortable with this plan.  Outpatient follow-up has been provided as needed. All questions have been  answered.    CRITICAL CARE Performed by: Pryor Curia   Total critical care time: 35 minutes  Critical care time was exclusive of separately billable procedures and treating other patients.  Critical care was necessary to treat or prevent imminent or life-threatening deterioration.  Critical care was time spent personally by me on the following activities: development of treatment plan with patient and/or surrogate as well as nursing, discussions with consultants, evaluation of patient's response to treatment, examination of patient, obtaining history from patient or surrogate, ordering and performing treatments and interventions, ordering and review of laboratory studies, ordering and review of radiographic studies, pulse oximetry and re-evaluation of patient's condition.    [name] [name], Delice Bison, DO 02/13/19 [phone]

## 2019-02-12 NOTE — ED Notes (Signed)
Pt on cardiac monitor.

## 2019-02-12 NOTE — ED Triage Notes (Signed)
Pt present with a snake bite to left posterior ankle. Happened approx 2035. Pt is having pain to the area but no other symptoms. Snake was brought in a container dead.

## 2019-02-13 LAB — CBC WITH DIFFERENTIAL/PLATELET
Abs Immature Granulocytes: 0.02 10*3/uL (ref 0.00–0.07)
Basophils Absolute: 0 10*3/uL (ref 0.0–0.1)
Basophils Relative: 1 %
Eosinophils Absolute: 0 10*3/uL (ref 0.0–0.5)
Eosinophils Relative: 1 %
HCT: 39.6 % (ref 36.0–46.0)
Hemoglobin: 13.2 g/dL (ref 12.0–15.0)
Immature Granulocytes: 0 %
Lymphocytes Relative: 36 %
Lymphs Abs: 3.1 10*3/uL (ref 0.7–4.0)
MCH: 30.2 pg (ref 26.0–34.0)
MCHC: 33.3 g/dL (ref 30.0–36.0)
MCV: 90.6 fL (ref 80.0–100.0)
Monocytes Absolute: 0.6 10*3/uL (ref 0.1–1.0)
Monocytes Relative: 7 %
Neutro Abs: 4.9 10*3/uL (ref 1.7–7.7)
Neutrophils Relative %: 55 %
Platelets: 281 10*3/uL (ref 150–400)
RBC: 4.37 MIL/uL (ref 3.87–5.11)
RDW: 11.9 % (ref 11.5–15.5)
WBC: 8.7 10*3/uL (ref 4.0–10.5)
nRBC: 0 % (ref 0.0–0.2)

## 2019-02-13 LAB — COMPREHENSIVE METABOLIC PANEL
ALT: 17 U/L (ref 0–44)
AST: 21 U/L (ref 15–41)
Albumin: 3.8 g/dL (ref 3.5–5.0)
Alkaline Phosphatase: 53 U/L (ref 38–126)
Anion gap: 7 (ref 5–15)
BUN: 14 mg/dL (ref 6–20)
CO2: 24 mmol/L (ref 22–32)
Calcium: 9.2 mg/dL (ref 8.9–10.3)
Chloride: 106 mmol/L (ref 98–111)
Creatinine, Ser: 0.64 mg/dL (ref 0.44–1.00)
GFR calc Af Amer: >60 mL/min (ref >60)
GFR calc non Af Amer: >60 mL/min (ref >60)
Glucose, Bld: 95 mg/dL (ref 70–99)
Potassium: 4 mmol/L (ref 3.5–5.1)
Sodium: 137 mmol/L (ref 135–145)
Total Bilirubin: 0.5 mg/dL (ref 0.3–1.2)
Total Protein: 7.3 g/dL (ref 6.5–8.1)

## 2019-02-13 LAB — PROTIME-INR
INR: 0.9 (ref 0.8–1.2)
Prothrombin Time: 12.4 seconds (ref 11.4–15.2)

## 2019-02-13 LAB — FIBRINOGEN: Fibrinogen: 257 mg/dL (ref 210–475)

## 2019-02-13 LAB — PREGNANCY, URINE: Preg Test, Ur: NEGATIVE

## 2019-02-13 MED ORDER — ONDANSETRON 4 MG PO TBDP
4.0000 mg | ORAL_TABLET | Freq: Once | ORAL | Status: AC
  Administered 2019-02-13: 01:00:00 4 mg via ORAL
  Filled 2019-02-13: qty 1

## 2019-02-13 MED ORDER — ONDANSETRON 4 MG PO TBDP: 4.0000 mg | ORAL_TABLET | Freq: Four times a day (QID) | ORAL | 0 refills | Status: DC | PRN

## 2019-02-13 MED ORDER — OXYCODONE HCL 5 MG PO TABS
5.0000 mg | ORAL_TABLET | Freq: Once | ORAL | Status: AC
  Administered 2019-02-13: 05:00:00 5 mg via ORAL
  Filled 2019-02-13: qty 1

## 2019-02-13 MED ORDER — OXYCODONE-ACETAMINOPHEN 5-325 MG PO TABS: 2.0000 | ORAL_TABLET | Freq: Four times a day (QID) | ORAL | 0 refills | Status: DC | PRN

## 2019-02-13 MED ORDER — OXYCODONE HCL 5 MG PO TABS
5.0000 mg | ORAL_TABLET | Freq: Once | ORAL | Status: AC
  Administered 2019-02-13: 01:00:00 5 mg via ORAL
  Filled 2019-02-13: qty 1

## 2019-02-13 NOTE — Discharge Instructions (Signed)
Please keep your leg elevated as much as possible and rest for the next several days.  You should not apply ice to your leg.  If you develop worsening pain, swelling, chest pain, shortness of breath, vomiting, fever or any symptom that is concerning to you, you may return to the emergency department or follow-up with poison control at [phone].

## 2019-02-13 NOTE — ED Notes (Addendum)
Measurements taken at 0055 Foot 22CM Ankle 22.5 CM Calf 39 CM Thigh 48 CM  Continues to complain of pain and tenderness, worse with movement. Given oral meds to manage pain, see EMAR. Will continue to monitor.

## 2019-02-13 NOTE — ED Notes (Signed)
Foot 22 CM Ankle 23 CM Calf 40 CM Thigh 50 CM

## 2019-02-13 NOTE — ED Notes (Signed)
Dr. Ward at bedside.

## 2019-02-13 NOTE — ED Notes (Signed)
ED Provider at bedside. 

## 2019-02-13 NOTE — ED Notes (Signed)
PMS intact before and after. Pt tolerated well. All questions answered. 

## 2019-02-13 NOTE — ED Notes (Signed)
Updated Danielle at [employer]

## 2019-02-25 ENCOUNTER — Ambulatory Visit (INDEPENDENT_AMBULATORY_CARE_PROVIDER_SITE_OTHER): Payer: PRIVATE HEALTH INSURANCE | Admitting: Orthopaedic Surgery

## 2019-02-25 ENCOUNTER — Ambulatory Visit (INDEPENDENT_AMBULATORY_CARE_PROVIDER_SITE_OTHER): Payer: PRIVATE HEALTH INSURANCE

## 2019-02-25 ENCOUNTER — Other Ambulatory Visit: Payer: Self-pay

## 2019-02-25 DIAGNOSIS — M79672 Pain in left foot: Secondary | ICD-10-CM | POA: Diagnosis not present

## 2019-02-25 NOTE — Progress Notes (Signed)
Office Visit Note   Patient: [name] [name]           Date of Birth: 1981-06-13           MRN: [id number] Visit Date: 02/25/2019              Requested by: Mikey Kirschner, [place],  [place] [id number] PCP: Mikey Kirschner, MD   Assessment & Plan: Visit Diagnoses:  1. Pain in left foot     Plan: Impression is post traumatic pain from recent snakebite.  I feel that the pain is due to the fact that the venom likely caused local tissue damage and as a result she has swelling and she has not been able to weight-bear due to this.  I recommend elevation and compression and I do recommend starting ankle range of motion at home.  She declined outpatient PT.  We will provide her with a Cam walker to help with weightbearing.  We discussed signs and symptoms of infection or neurovascular compromise to look out for in which case she would need to see [place] immediately or present to the ED.  Questions encouraged and answered.  Follow-up as needed.  Follow-Up Instructions: Return if symptoms worsen or fail to improve.   Orders:  Orders Placed This Encounter  Procedures   XR Foot Complete Left   No orders of the defined types were placed in this encounter.     Procedures: No procedures performed   Clinical Data: No additional findings.   Subjective: Chief Complaint  Patient presents with   Left Foot - Pain    [name] is a 38 year old female comes in for evaluation of left foot and ankle pain and swelling.  She sustained a copperhead snake bite on 02/12/2019 and was evaluated on 2 occasions in the ED.  She was not given any antivenom or antibiotics.  She has continued to have pain and discomfort and swelling of her left foot, ankle, lower leg.  She has had a lot of pain with any weightbearing therefore she has been using crutches this entire time.  She denies any constitutional symptoms.  The pain is pretty much only present when she moves her ankle or when  she weightbears.  She is the sister-in-law of [name] [name] in our office.  She was ruled out for a DVT by the ED.   Review of Systems  Constitutional: Negative.   HENT: Negative.   Eyes: Negative.   Respiratory: Negative.   Cardiovascular: Negative.   Endocrine: Negative.   Musculoskeletal: Negative.   Neurological: Negative.   Hematological: Negative.   Psychiatric/Behavioral: Negative.   All other systems reviewed and are negative.    Objective: Vital Signs: LMP 01/29/2019   Physical Exam Vitals signs and nursing note reviewed.  Constitutional:      Appearance: She is well-developed.  HENT:     Head: Normocephalic and atraumatic.  Neck:     Musculoskeletal: Neck supple.  Pulmonary:     Effort: Pulmonary effort is normal.  Abdominal:     Palpations: Abdomen is soft.  Skin:    General: Skin is warm.     Capillary Refill: Capillary refill takes less than 2 seconds.  Neurological:     Mental Status: She is alert and oriented to person, place, and time.  Psychiatric:        Behavior: Behavior normal.        Thought Content: Thought content normal.  Judgment: Judgment normal.     Ortho Exam Left foot and ankle exam shows erythema on the medial aspect of the heel.  There is no fluctuance or evidence of infection.  No neurovascular compromise.  This is slightly tender to palpation.  She has a mild equinus contracture likely secondary to being nonweightbearing and avoidance of range of motion secondary to pain.  She does have some ecchymosis throughout her calf and ankle.  There is no skin necrosis or evidence of compartment syndrome. Specialty Comments:  No specialty comments available.  Imaging: Xr Foot Complete Left  Result Date: 02/25/2019 No acute or structural abnormalities.    PMFS History: Patient Active Problem List   Diagnosis Date Noted   Boil 11/20/2014   Sebaceous cyst 11/20/2014   Migraine headache with aura 09/19/2013   Consultation for  sterilization 06/01/2013   Abnormal uterine bleeding (AUB) 05/11/2013   BV (bacterial vaginosis) 05/11/2013   Past Medical History:  Diagnosis Date   Boil 11/20/2014   Breast mass, right 07/2014   Colitis    no current problems   Fibromyalgia    GERD (gastroesophageal reflux disease)    Heart murmur    states no known problem   Sebaceous cyst 11/20/2014    Family History  Problem Relation Age of Onset   COPD Father    Diabetes Father    Heart disease Father    Diverticulosis Father    Diabetes Sister    Leukemia Sister    Diabetes Other    Cancer Other    Thyroid disease Other    Other Mother        shingles    Past Surgical History:  Procedure Laterality Date   COLPOSCOPY     x 2   LAPAROSCOPIC TUBAL LIGATION Bilateral 06/15/2013   Procedure: LAPAROSCOPIC BILATERAL TUBAL LIGATION WITH CAUTERY;  Surgeon: Florian Buff, MD;  Location: [facility];  Service: Gynecology;  Laterality: Bilateral;   RADIOACTIVE SEED GUIDED EXCISIONAL BREAST BIOPSY Right 08/03/2014   Procedure: RIGHT BREAST SEED GUIDED EXCISION;  Surgeon: Rolm Bookbinder, MD;  Location: [facility];  Service: General;  Laterality: Right;   TUBAL LIGATION     WISDOM TOOTH EXTRACTION     Social History   Occupational History   Occupation: TRAVEL    Employer: [employer]  Tobacco Use   Smoking status: Former Smoker    Packs/day: 0.00    Years: 0.00    Pack years: 0.00    Types: Cigarettes    Quit date: 12/02/2007    Years since quitting: 11.2   Smokeless tobacco: Never Used  Substance and Sexual Activity   Alcohol use: No   Drug use: No   Sexual activity: Yes    Birth control/protection: Surgical    Comment: tubal

## 2019-02-28 ENCOUNTER — Ambulatory Visit: Payer: PRIVATE HEALTH INSURANCE | Admitting: Orthopedic Surgery

## 2019-10-17 ENCOUNTER — Encounter: Payer: Self-pay | Admitting: Family Medicine

## 2020-09-15 HISTORY — PX: BACK SURGERY: SHX140

## 2020-11-02 ENCOUNTER — Ambulatory Visit (INDEPENDENT_AMBULATORY_CARE_PROVIDER_SITE_OTHER): Payer: 59

## 2020-11-02 ENCOUNTER — Ambulatory Visit (INDEPENDENT_AMBULATORY_CARE_PROVIDER_SITE_OTHER): Payer: 59 | Admitting: Orthopaedic Surgery

## 2020-11-02 DIAGNOSIS — M5416 Radiculopathy, lumbar region: Secondary | ICD-10-CM

## 2020-11-02 MED ORDER — HYDROCODONE-ACETAMINOPHEN 5-325 MG PO TABS: 1.0000 | ORAL_TABLET | Freq: Two times a day (BID) | ORAL | 0 refills | Status: DC | PRN

## 2020-11-02 MED ORDER — DICLOFENAC SODIUM 75 MG PO TBEC: 75.0000 mg | DELAYED_RELEASE_TABLET | Freq: Two times a day (BID) | ORAL | 2 refills | Status: DC | PRN

## 2020-11-02 MED ORDER — ONDANSETRON HCL 4 MG PO TABS: 4.0000 mg | ORAL_TABLET | Freq: Three times a day (TID) | ORAL | 0 refills | Status: DC | PRN

## 2020-11-02 NOTE — Progress Notes (Signed)
Office Visit Note   Patient: [name] [name]           Date of Birth: 02-23-1981           MRN: [id number] Visit Date: 11/02/2020              Requested by: No referring provider defined for this encounter. PCP: Mikey Kirschner, MD (Inactive)   Assessment & Plan: Visit Diagnoses:  1. Lumbar radiculopathy     Plan: Impression is right lower extremity lumbar radiculopathy with probable underlying herniated disc.  Will obtain urgent MRI to assess for this.  She has had an anaphylactic reaction to an intra-articular knee injection in the past so we have decided not to start her on a steroid taper.  She will continue with the muscle relaxer she has.  Have also called in an anti-inflammatory and a short course of Norco to take as needed.  She will follow up with [place] after the MRI has been completed.  Follow-Up Instructions: Return for after MRI.   Orders:  Orders Placed This Encounter  Procedures  . XR Lumbar Spine 2-3 Views   Meds ordered this encounter  Medications  . HYDROcodone-acetaminophen (NORCO) 5-325 MG tablet    Sig: Take 1 tablet by mouth 2 (two) times daily as needed.    Dispense:  20 tablet    Refill:  0  . diclofenac (VOLTAREN) 75 MG EC tablet    Sig: Take 1 tablet (75 mg total) by mouth 2 (two) times daily as needed.    Dispense:  60 tablet    Refill:  2  . ondansetron (ZOFRAN) 4 MG tablet    Sig: Take 1 tablet (4 mg total) by mouth every 8 (eight) hours as needed for nausea or vomiting.    Dispense:  40 tablet    Refill:  0      Procedures: No procedures performed   Clinical Data: No additional findings.   Subjective: Chief Complaint  Patient presents with  . Right Leg - Pain    HPI patient is a very pleasant 40 year old female who presents to our clinic today with pain to the right posterior lateral hip going down the back of the leg and into the calf.  This is been ongoing for the past 3 weeks which has significantly worsened over the past  week.  No specific injury but she does note that she was doing yoga stretches with her daughter prior to the onset of symptoms.  Her pain is constant but worse with sitting as well as flexion of the lumbar spine or going from a seated or lying position to a standing position.  She notes frequent charley horses and cramping sensations as well is associated swelling to her calf.  She has been taking ibuprofen and using Voltaren gel with out relief of symptoms.  She has recently tried a muscle relaxer which does seem to put her to sleep.  She denies any paresthesias.  No bowel or bladder change.  She was recently seen in a hospital setting where ultrasound right lower extremity was obtained to rule out DVT.  This was negative.  Review of Systems as detailed in HPI.   Objective: Vital Signs: There were no vitals taken for this visit.  Physical Exam well-developed well-nourished female no acute distress.  Alert and oriented x3  Ortho Exam lumbar spine exam shows moderate pain with lumbar flexion.  Mild pain with extension.  Positive straight leg raise.  No tenderness  to the greater trochanter.  Negative logroll negative FADIR.  She does have slight weakness with resisted knee flexion and extension.  Calf is soft and nontender.  She is neurovascular intact distally.  Specialty Comments:  No specialty comments available.  Imaging: No results found.   PMFS History: Patient Active Problem List   Diagnosis Date Noted  . Boil 11/20/2014  . Sebaceous cyst 11/20/2014  . Migraine headache with aura 09/19/2013  . Consultation for sterilization 06/01/2013  . Abnormal uterine bleeding (AUB) 05/11/2013  . BV (bacterial vaginosis) 05/11/2013   Past Medical History:  Diagnosis Date  . Boil 11/20/2014  . Breast mass, right 07/2014  . Colitis    no current problems  . Fibromyalgia   . GERD (gastroesophageal reflux disease)   . Heart murmur    states no known problem  . Sebaceous cyst 11/20/2014     Family History  Problem Relation Age of Onset  . COPD Father   . Diabetes Father   . Heart disease Father   . Diverticulosis Father   . Diabetes Sister   . Leukemia Sister   . Diabetes Other   . Cancer Other   . Thyroid disease Other   . Other Mother        shingles    Past Surgical History:  Procedure Laterality Date  . COLPOSCOPY     x 2  . LAPAROSCOPIC TUBAL LIGATION Bilateral 06/15/2013   Procedure: LAPAROSCOPIC BILATERAL TUBAL LIGATION WITH CAUTERY;  Surgeon: Florian Buff, MD;  Location: [facility];  Service: Gynecology;  Laterality: Bilateral;  . RADIOACTIVE SEED GUIDED EXCISIONAL BREAST BIOPSY Right 08/03/2014   Procedure: RIGHT BREAST SEED GUIDED EXCISION;  Surgeon: Rolm Bookbinder, MD;  Location: [facility];  Service: General;  Laterality: Right;  . TUBAL LIGATION    . WISDOM TOOTH EXTRACTION     Social History   Occupational History  . Occupation: TRAVEL    Employer: [employer]  Tobacco Use  . Smoking status: Former Smoker    Packs/day: 0.00    Years: 0.00    Pack years: 0.00    Types: Cigarettes    Quit date: 12/02/2007    Years since quitting: 12.9  . Smokeless tobacco: Never Used  Substance and Sexual Activity  . Alcohol use: No  . Drug use: No  . Sexual activity: Yes    Birth control/protection: Surgical    Comment: tubal

## 2020-11-06 ENCOUNTER — Other Ambulatory Visit: Payer: Self-pay

## 2020-11-06 ENCOUNTER — Ambulatory Visit
Admission: RE | Admit: 2020-11-06 | Discharge: 2020-11-06 | Disposition: A | Discharge status: Home / Self Care | Payer: PRIVATE HEALTH INSURANCE | Source: Ambulatory Visit | Attending: Orthopaedic Surgery | Admitting: Orthopaedic Surgery

## 2020-11-06 DIAGNOSIS — M5416 Radiculopathy, lumbar region: Secondary | ICD-10-CM

## 2020-11-06 IMAGING — MR MR LUMBAR SPINE W/O CM
4 of 5 series · 27 of 48 positions shown · non-contrast
Comparison: None.

CLINICAL DATA: Low back pain radiating into right hip and leg

EXAM:
MRI LUMBAR SPINE WITHOUT CONTRAST
TECHNIQUE: Multiplanar, multisequence MR imaging of the lumbar spine was
performed. No intravenous contrast was administered.

[Series 3: T2 · sagittal · 4.0mm · 1.09mm/px · 6 of 15 slices shown (1 of 2)]
[im 1/15]
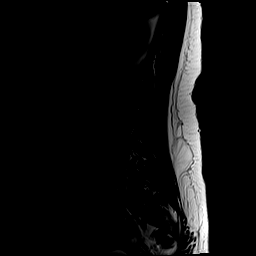
[im 3/15]
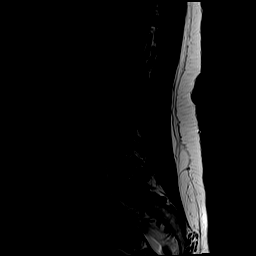
[im 6/15]
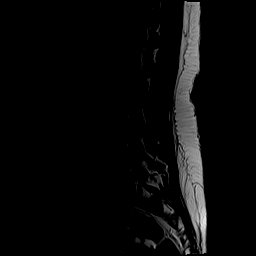
[im 9/15]
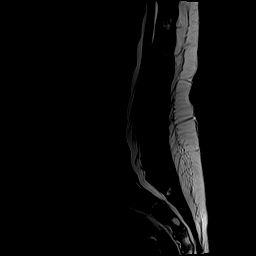
[im 12/15]
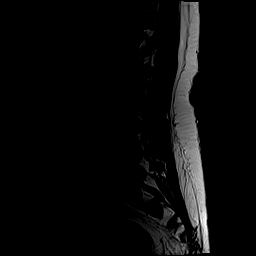
[im 15/15]
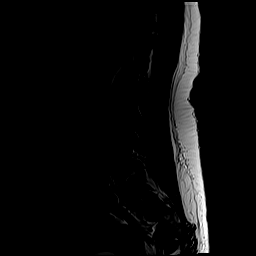

[Series 5: T1 · sagittal · 4.0mm · 1.09mm/px · 6 of 15 slices shown (1 of 2)]
[im 1/15]
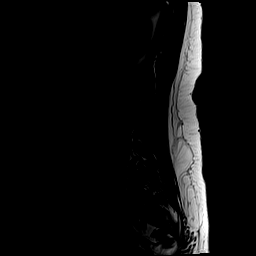
[im 3/15]
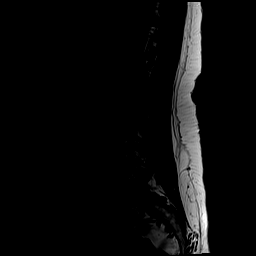
[im 6/15]
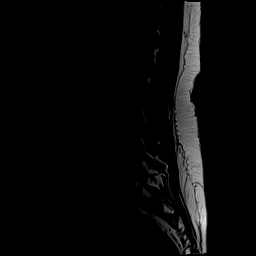
[im 9/15]
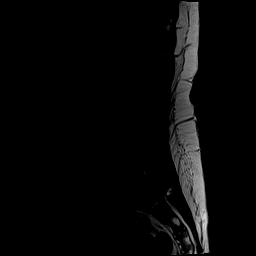
[im 12/15]
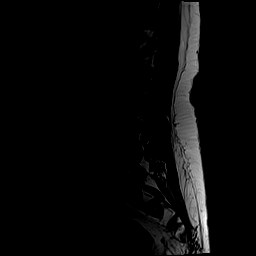
[im 15/15]
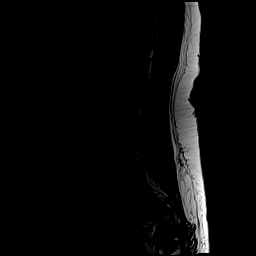

[Series 6: T2 · axial · 4.0mm · 0.39mm/px · z∈[-57,+146]mm · 9 of 38 slices shown (2 of 2)]
[im 1/38]
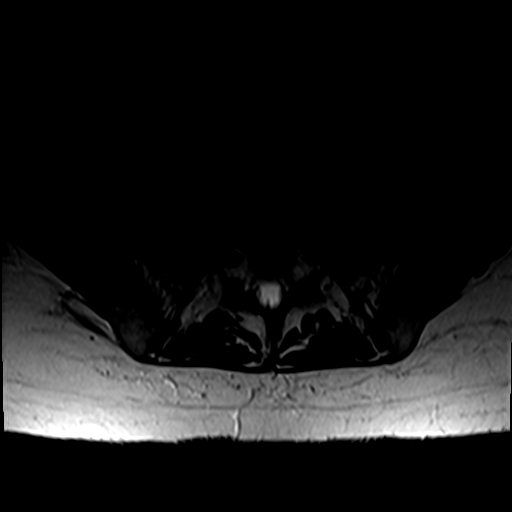
[im 6/38]
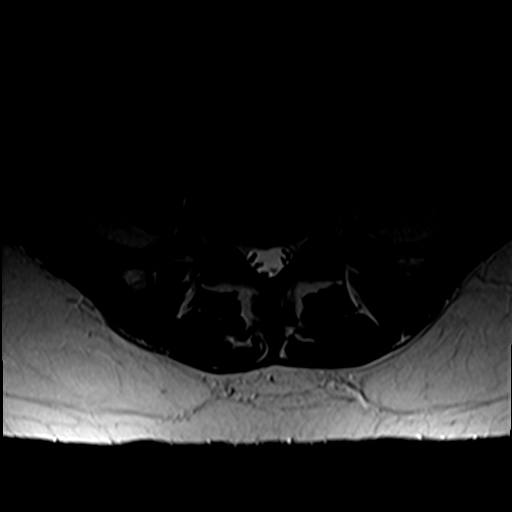
[im 11/38]
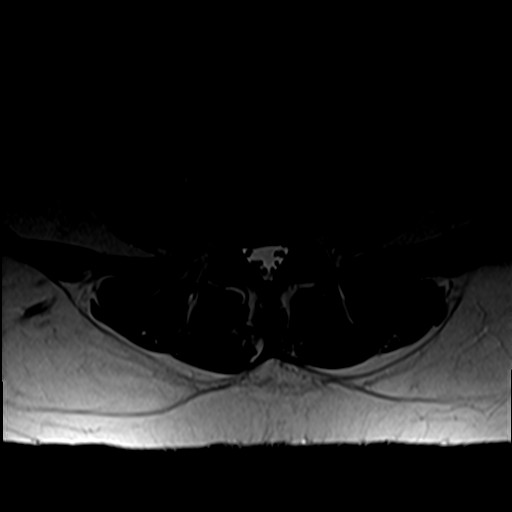
[im 16/38]
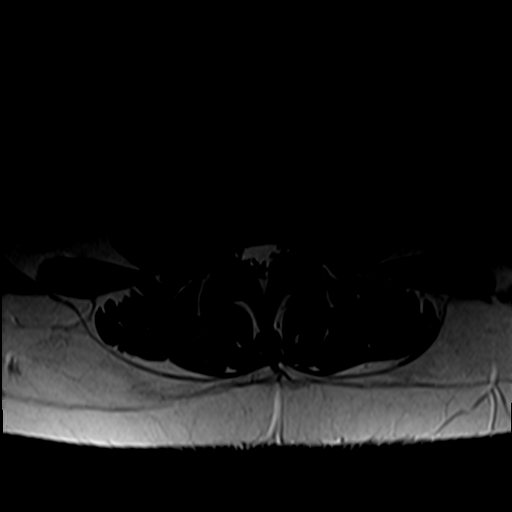
[im 19/38]
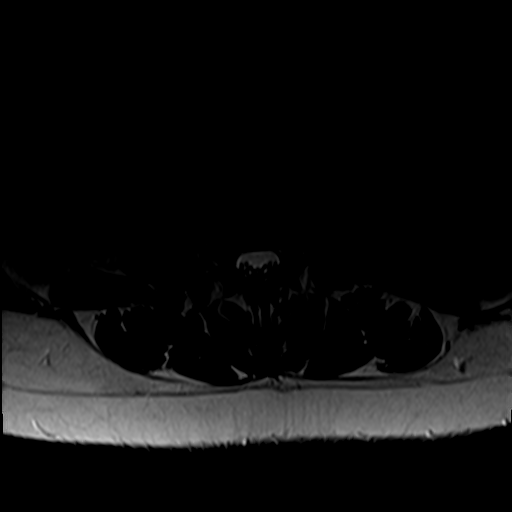
[im 22/38]
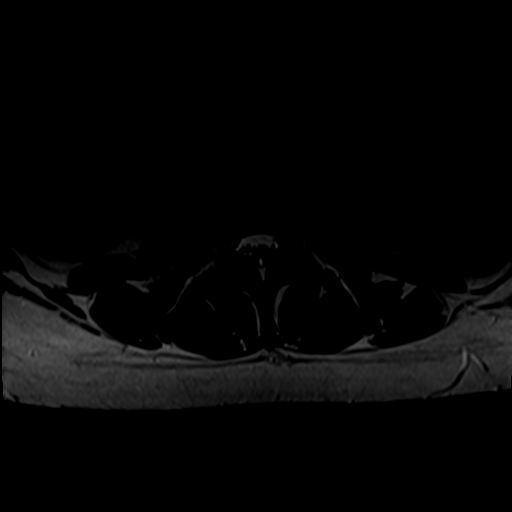
[im 27/38]
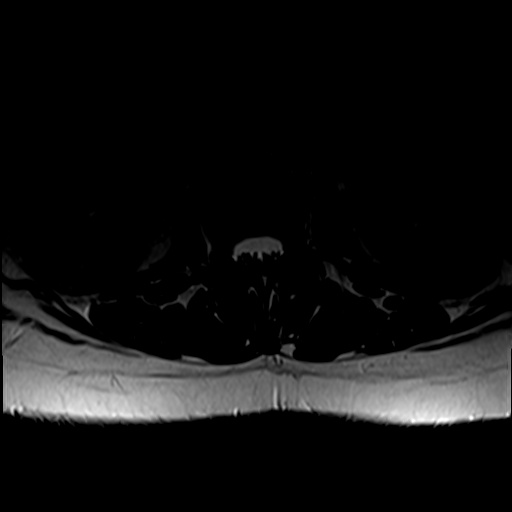
[im 32/38]
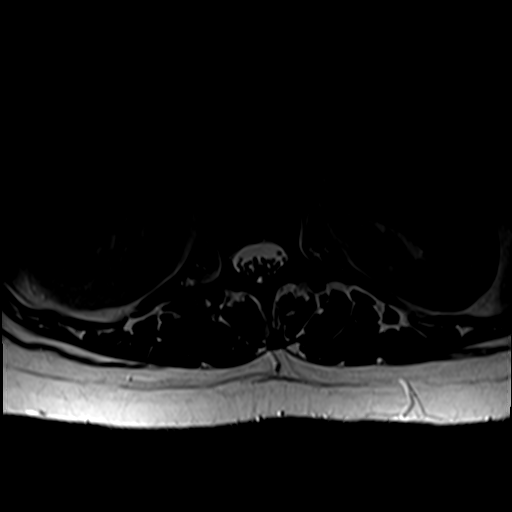
[im 38/38]
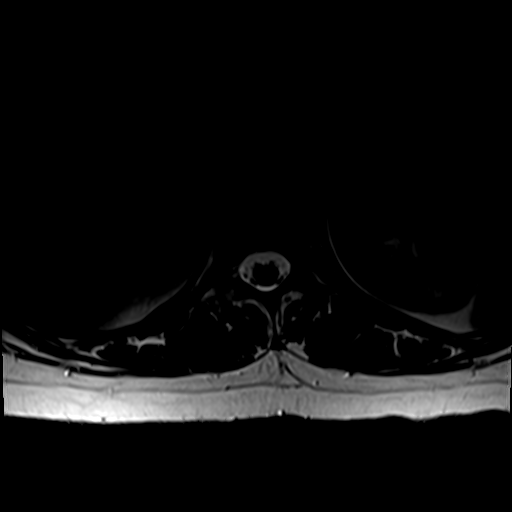

[Series 7: T1 · axial · 4.0mm · 0.39mm/px · z∈[-57,+118]mm · 6 of 38 slices shown (2 of 2)]
[im 1/38]
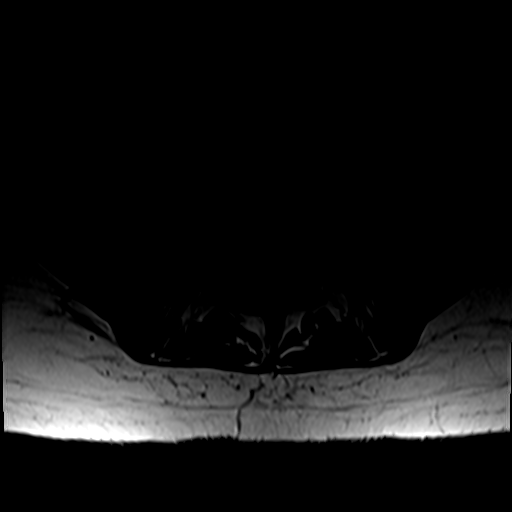
[im 6/38]
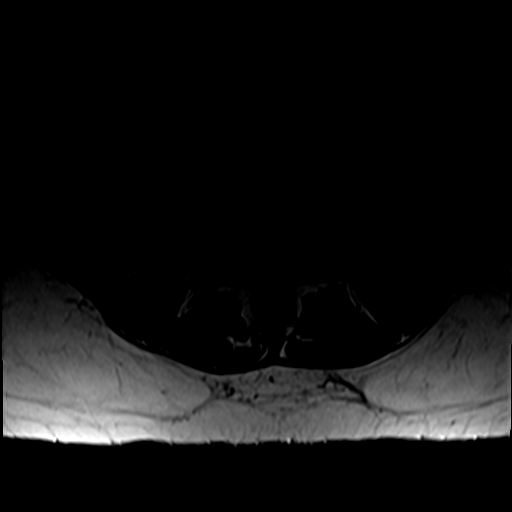
[im 11/38]
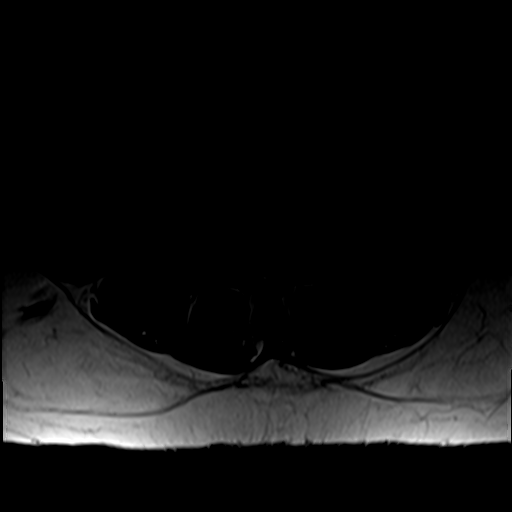
[im 16/38]
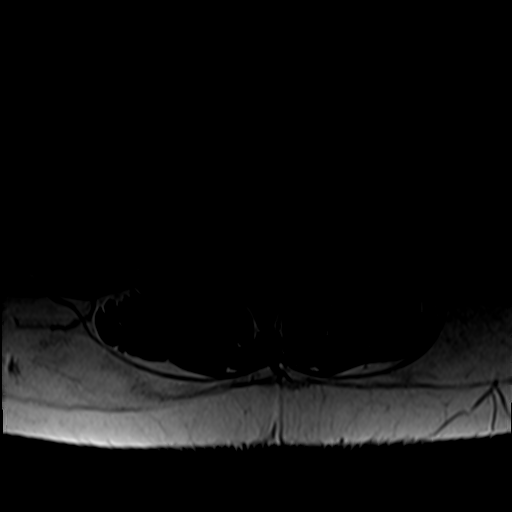
[im 19/38]
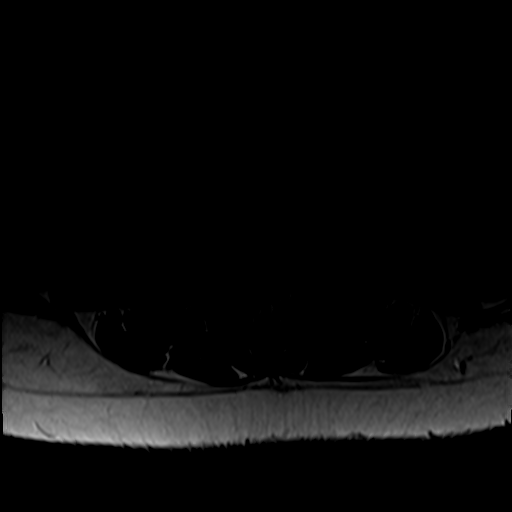
[im 32/38]
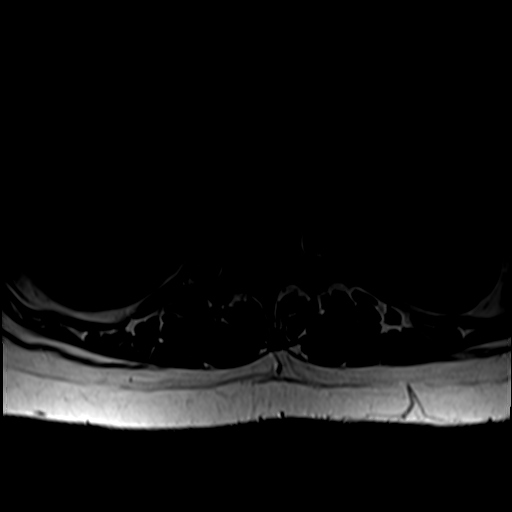

[27 of 48 positions shown; findings below may reference images not displayed]

FINDINGS: Segmentation:  Standard.

Alignment:  Trace retrolisthesis at L4-L5.

Vertebrae: Vertebral body heights are maintained. No marrow edema.
No suspicious osseous lesion. Minor degenerative endplate marrow
edema at L5-S1.

Conus medullaris and cauda equina: Conus extends to the L1-L2 level.
Conus and cauda equina appear normal.

Paraspinal and other soft tissues: Unremarkable.

Disc levels:

L1-L2:  No canal or foraminal stenosis.

L2-L3:  No canal or foraminal stenosis.

L3-L4:  No canal or foraminal stenosis.

L4-L5: Disc desiccation without substantial height loss. Disc bulge
with minimal central disc extrusion extending just below disc level.
No canal stenosis. Minor foraminal stenosis.

L5-S1: Disc desiccation without substantial disc height loss. Disc
bulge with superimposed right subarticular protrusion. No canal
stenosis. Effacement of right subarticular recess with compression
of traversing S1 nerve root. Mild right foraminal stenosis. No left
foraminal stenosis.
IMPRESSION: Lower lumbar degenerative changes as detailed above. Disc herniation
at L5-S1 compresses the traversing right S1 nerve root.

## 2020-11-06 NOTE — Progress Notes (Signed)
Needs appt

## 2020-11-13 ENCOUNTER — Other Ambulatory Visit: Payer: Self-pay | Admitting: Physician Assistant

## 2020-11-13 ENCOUNTER — Ambulatory Visit (INDEPENDENT_AMBULATORY_CARE_PROVIDER_SITE_OTHER): Payer: 59 | Admitting: Orthopaedic Surgery

## 2020-11-13 DIAGNOSIS — M5416 Radiculopathy, lumbar region: Secondary | ICD-10-CM | POA: Diagnosis not present

## 2020-11-13 MED ORDER — HYDROCODONE-ACETAMINOPHEN 5-325 MG PO TABS: 1.0000 | ORAL_TABLET | Freq: Two times a day (BID) | ORAL | 0 refills | Status: DC | PRN

## 2020-11-13 NOTE — Addendum Note (Signed)
Addended by: Azucena Cecil on: 11/13/2020 08:29 AM   Modules accepted: Orders

## 2020-11-13 NOTE — Addendum Note (Signed)
Addended by: Precious Bard on: 11/13/2020 08:43 AM   Modules accepted: Orders

## 2020-11-13 NOTE — Progress Notes (Addendum)
Office Visit Note   Patient: [name] [name]           Date of Birth: 03/10/1981           MRN: [id number] Visit Date: 11/13/2020              Requested by: No referring provider defined for this encounter. PCP: Mikey Kirschner, MD (Inactive)   Assessment & Plan: Visit Diagnoses:  1. Lumbar radiculopathy     Plan: MRI of the lumbar spine independently reviewed and interpreted shows disc bulge at L5-S1 with right subarticular stenosis of the right S1 nerve.  These findings were reviewed with the patient and recommendations for epidural steroid injection.  She does have a remote history of a possible reaction to cortisone but based on my questioning it does not sound like a true anaphylactic reaction therefore she is agreeable to trying an ESI near future.  We'll get this set up with either Dr. Ernestina Patches or [facility] imaging 1st available.  Questions encouraged and answered.  Follow-up as needed.  Follow-Up Instructions: No follow-ups on file.   Orders:  No orders of the defined types were placed in this encounter.  Meds ordered this encounter  Medications  . HYDROcodone-acetaminophen (NORCO) 5-325 MG tablet    Sig: Take 1 tablet by mouth 2 (two) times daily as needed.    Dispense:  20 tablet    Refill:  0      Procedures: No procedures performed   Clinical Data: No additional findings.   Subjective: Chief Complaint  Patient presents with  . Lower Back - Pain    [name] returns today for MRI review.  She right leg radiculopathy.  NSAIDs and has helped take the edge off the pain.  Denies any bowel or bladder dysfunction.   Review of Systems  Constitutional: Negative.   HENT: Negative.   Eyes: Negative.   Respiratory: Negative.   Cardiovascular: Negative.   Endocrine: Negative.   Musculoskeletal: Negative.   Neurological: Negative.   Hematological: Negative.   Psychiatric/Behavioral: Negative.   All other systems reviewed and are  negative.    Objective: Vital Signs: There were no vitals taken for this visit.  Physical Exam Vitals and nursing note reviewed.  Constitutional:      Appearance: She is well-developed and well-nourished.  Pulmonary:     Effort: Pulmonary effort is normal.  Skin:    General: Skin is warm.     Capillary Refill: Capillary refill takes less than 2 seconds.  Neurological:     Mental Status: She is alert and oriented to person, place, and time.  Psychiatric:        Mood and Affect: Mood and affect normal.        Behavior: Behavior normal.        Thought Content: Thought content normal.        Judgment: Judgment normal.     Ortho Exam right lower extremity shows a slight weakness in ankle dorsiflexion.  Sensation intact. Specialty Comments:  No specialty comments available.  Imaging: No results found.   PMFS History: Patient Active Problem List   Diagnosis Date Noted  . Boil 11/20/2014  . Sebaceous cyst 11/20/2014  . Migraine headache with aura 09/19/2013  . Consultation for sterilization 06/01/2013  . Abnormal uterine bleeding (AUB) 05/11/2013  . BV (bacterial vaginosis) 05/11/2013   Past Medical History:  Diagnosis Date  . Boil 11/20/2014  . Breast mass, right 07/2014  . Colitis    no  current problems  . Fibromyalgia   . GERD (gastroesophageal reflux disease)   . Heart murmur    states no known problem  . Sebaceous cyst 11/20/2014    Family History  Problem Relation Age of Onset  . COPD Father   . Diabetes Father   . Heart disease Father   . Diverticulosis Father   . Diabetes Sister   . Leukemia Sister   . Diabetes Other   . Cancer Other   . Thyroid disease Other   . Other Mother        shingles    Past Surgical History:  Procedure Laterality Date  . COLPOSCOPY     x 2  . LAPAROSCOPIC TUBAL LIGATION Bilateral 06/15/2013   Procedure: LAPAROSCOPIC BILATERAL TUBAL LIGATION WITH CAUTERY;  Surgeon: Florian Buff, MD;  Location: [facility];  Service:  Gynecology;  Laterality: Bilateral;  . RADIOACTIVE SEED GUIDED EXCISIONAL BREAST BIOPSY Right 08/03/2014   Procedure: RIGHT BREAST SEED GUIDED EXCISION;  Surgeon: Rolm Bookbinder, MD;  Location: [place];  Service: General;  Laterality: Right;  . TUBAL LIGATION    . WISDOM TOOTH EXTRACTION     Social History   Occupational History  . Occupation: TRAVEL    Employer: [employer]  Tobacco Use  . Smoking status: Former Smoker    Packs/day: 0.00    Years: 0.00    Pack years: 0.00    Types: Cigarettes    Quit date: 12/02/2007    Years since quitting: 12.9  . Smokeless tobacco: Never Used  Substance and Sexual Activity  . Alcohol use: No  . Drug use: No  . Sexual activity: Yes    Birth control/protection: Surgical    Comment: tubal

## 2020-11-14 ENCOUNTER — Other Ambulatory Visit: Payer: Self-pay

## 2020-11-14 ENCOUNTER — Ambulatory Visit
Admission: RE | Admit: 2020-11-14 | Discharge: 2020-11-14 | Disposition: A | Discharge status: Home / Self Care | Payer: PRIVATE HEALTH INSURANCE | Source: Ambulatory Visit | Attending: Orthopaedic Surgery | Admitting: Orthopaedic Surgery

## 2020-11-14 DIAGNOSIS — M5416 Radiculopathy, lumbar region: Secondary | ICD-10-CM

## 2020-11-14 IMAGING — XA Imaging study
1 series · 1 of 1 positions shown · non-contrast
Comparison: none

CLINICAL DATA: Low back and right lower extremity pain. On MR, Disc
herniation
at L5-S1 compresses the traversing right S1 nerve root.

[Series 1: ortho standard · 1 of 1 slices shown]
[im 1/1]
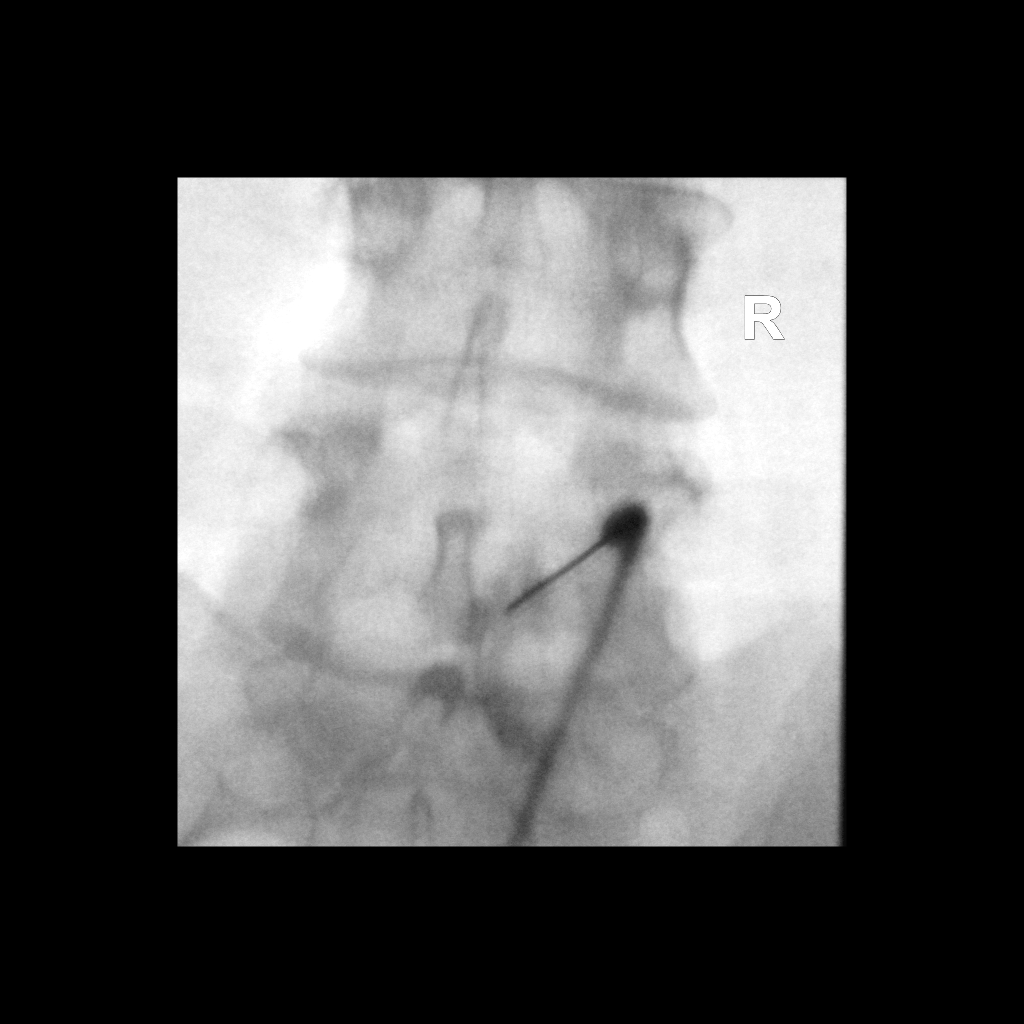

[1 of 1 positions shown; findings below may reference images not displayed]

EXAM:
LUMBAR EPIDURAL INJECTION:

DIAGNOSTIC EPIDURAL INJECTION:

THERAPEUTIC EPIDURAL INJECTION:

PROCEDURE:
The procedure, risks, benefits, and alternatives were explained to
the patient. Questions regarding the procedure were encouraged and
answered. The patient understands and consents to the procedure.

An interlaminar approach was performed on right at L5-S1. The
overlying skin was cleansed and anesthetized. A 20 gauge epidural
needle was advanced using loss-of-resistance technique.

Injection of Isovue-M 200 shows a good epidural pattern with spread
above and below the level of needle placement, primarily on the
right no vascular opacification is seen.

120mg of Depo-Medrol mixed with 2ml lidocaine 1% were instilled. The
procedure was well-tolerated, and the patient was discharged thirty
minutes following the injection in good condition.

FLUOROSCOPY TIME:  19 seconds; 20 [G3] DAP

COMPLICATIONS:
None immediate
IMPRESSION: Technically successful epidural injection on the right at L5-S1.

## 2020-11-14 MED ORDER — IOPAMIDOL (ISOVUE-M 200) INJECTION 41%
1.0000 mL | Freq: Once | INTRAMUSCULAR | Status: AC
  Administered 2020-11-14: 1 mL via EPIDURAL

## 2020-11-14 MED ORDER — METHYLPREDNISOLONE ACETATE 40 MG/ML INJ SUSP (RADIOLOG
120.0000 mg | Freq: Once | INTRAMUSCULAR | Status: AC
  Administered 2020-11-14: 120 mg via EPIDURAL

## 2020-11-14 NOTE — Discharge Instructions (Signed)

## 2020-11-25 ENCOUNTER — Encounter (HOSPITAL_BASED_OUTPATIENT_CLINIC_OR_DEPARTMENT_OTHER): Payer: Self-pay | Admitting: Obstetrics and Gynecology

## 2020-11-25 ENCOUNTER — Emergency Department (HOSPITAL_BASED_OUTPATIENT_CLINIC_OR_DEPARTMENT_OTHER)
Admission: EM | Admit: 2020-11-25 | Discharge: 2020-11-26 | Disposition: A | Discharge status: Home / Self Care | Payer: 59 | Attending: Emergency Medicine | Admitting: Emergency Medicine

## 2020-11-25 ENCOUNTER — Emergency Department (HOSPITAL_COMMUNITY): Payer: 59

## 2020-11-25 ENCOUNTER — Other Ambulatory Visit: Payer: Self-pay

## 2020-11-25 DIAGNOSIS — M549 Dorsalgia, unspecified: Secondary | ICD-10-CM | POA: Diagnosis not present

## 2020-11-25 DIAGNOSIS — R200 Anesthesia of skin: Secondary | ICD-10-CM | POA: Diagnosis not present

## 2020-11-25 DIAGNOSIS — R29898 Other symptoms and signs involving the musculoskeletal system: Secondary | ICD-10-CM

## 2020-11-25 DIAGNOSIS — R531 Weakness: Secondary | ICD-10-CM | POA: Insufficient documentation

## 2020-11-25 DIAGNOSIS — R202 Paresthesia of skin: Secondary | ICD-10-CM | POA: Insufficient documentation

## 2020-11-25 DIAGNOSIS — Z87891 Personal history of nicotine dependence: Secondary | ICD-10-CM | POA: Diagnosis not present

## 2020-11-25 IMAGING — MR MR LUMBAR SPINE W/O CM
4 of 5 series · 27 of 48 positions shown · non-contrast
Comparison: [DATE]

CLINICAL DATA: Low back pain with right lower extremity numbness

EXAM:
MRI LUMBAR SPINE WITHOUT CONTRAST
TECHNIQUE: Multiplanar, multisequence MR imaging of the lumbar spine was
performed. No intravenous contrast was administered.

[Series 5: T2 · sagittal · 4.0mm · 0.73mm/px · 6 of 16 slices shown (1 of 2)]
[im 1/16]
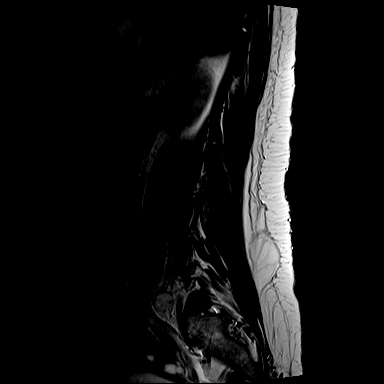
[im 4/16]
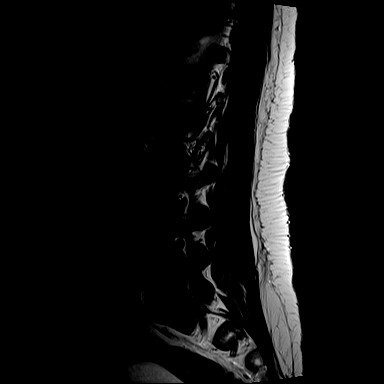
[im 7/16]
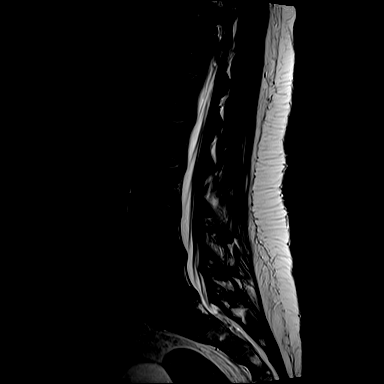
[im 10/16]
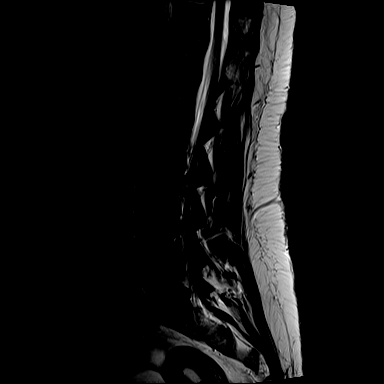
[im 13/16]
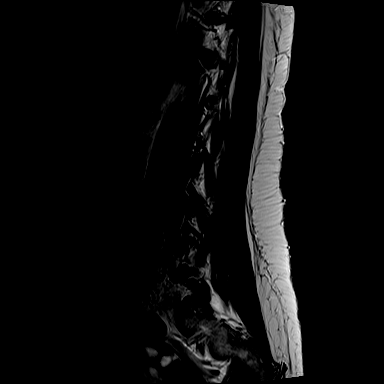
[im 16/16]
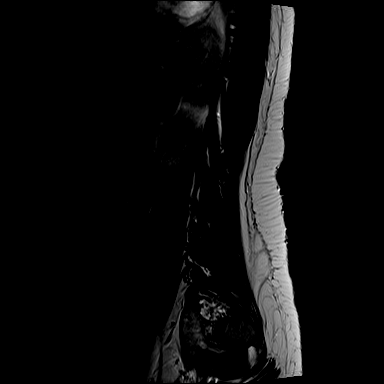

[Series 7: T1 · sagittal · 4.0mm · 0.88mm/px · 7 of 16 slices shown (1 of 2)]
[im 1/16]
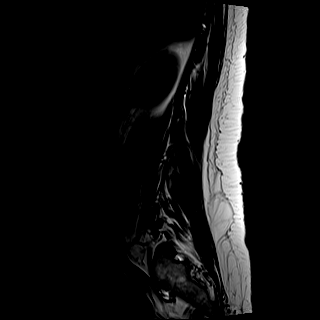
[im 3/16]
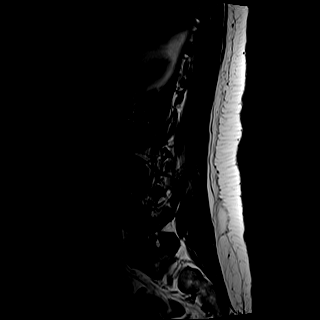
[im 6/16]
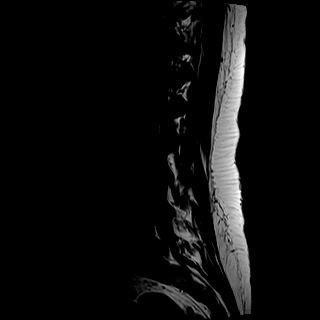
[im 8/16]
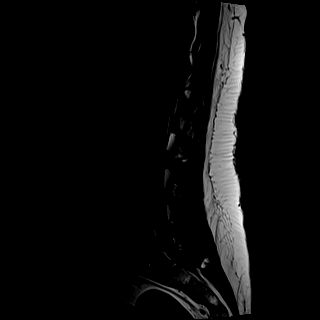
[im 11/16]
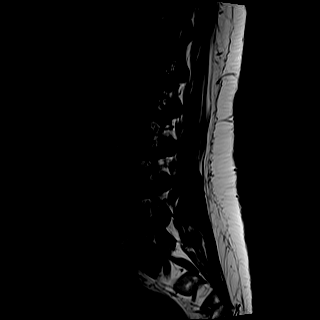
[im 13/16]
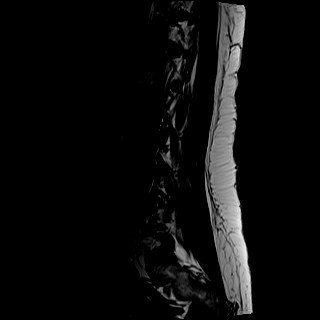
[im 16/16]
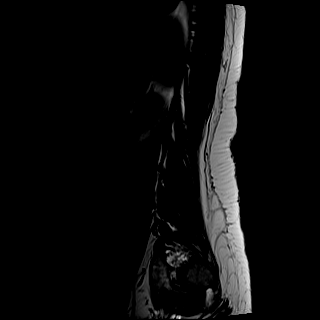

[Series 8: T2 · axial · 4.0mm · 0.57mm/px · z∈[-134,+76]mm · 8 of 35 slices shown (2 of 2)]
[im 1/35]
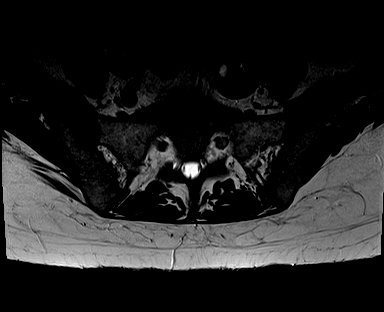
[im 6/35]
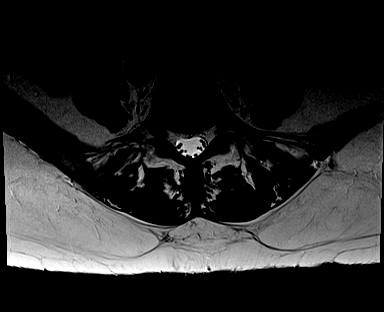
[im 11/35]
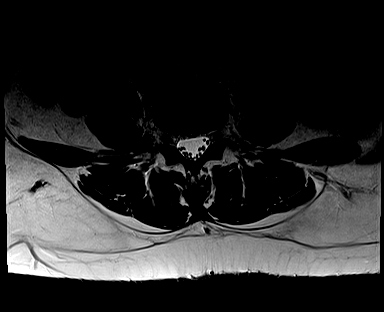
[im 16/35]
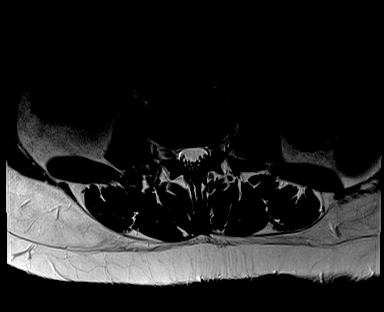
[im 19/35]
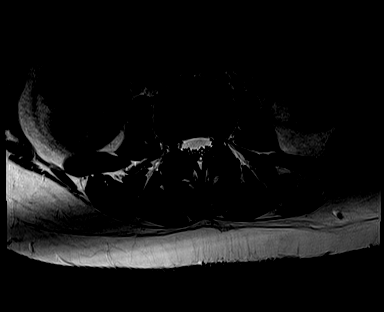
[im 24/35]
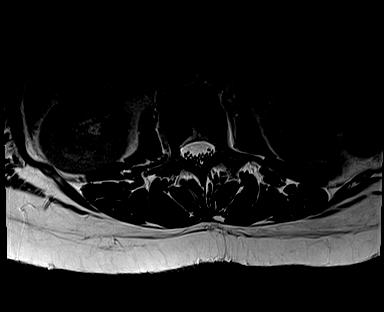
[im 29/35]
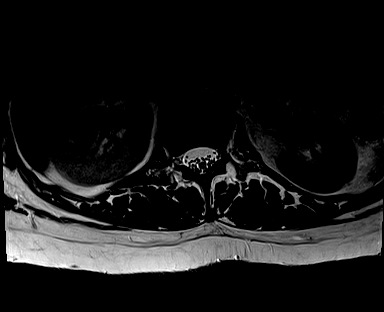
[im 35/35]
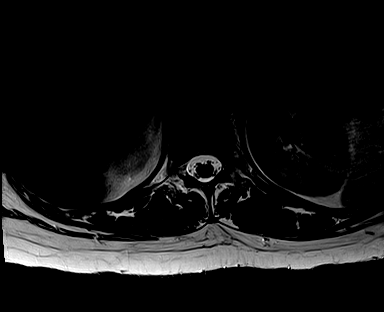

[Series 9: T1 · axial · 4.0mm · 0.34mm/px · z∈[-138,+47]mm · 6 of 35 slices shown (2 of 2)]
[im 1/35]
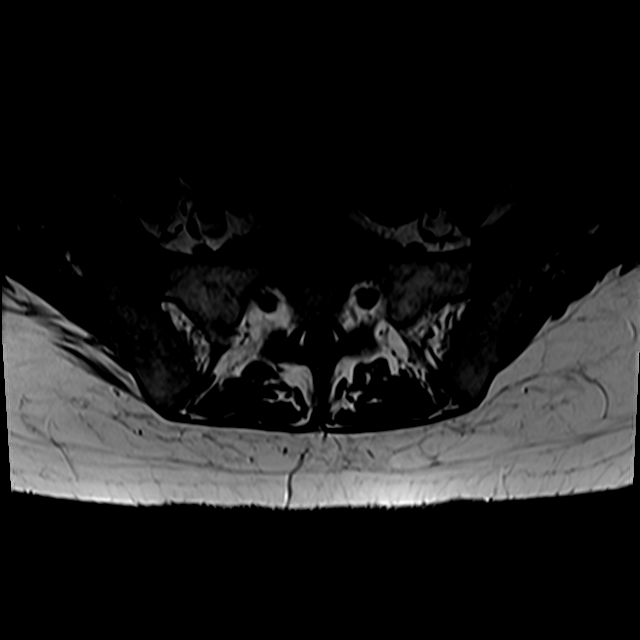
[im 6/35]
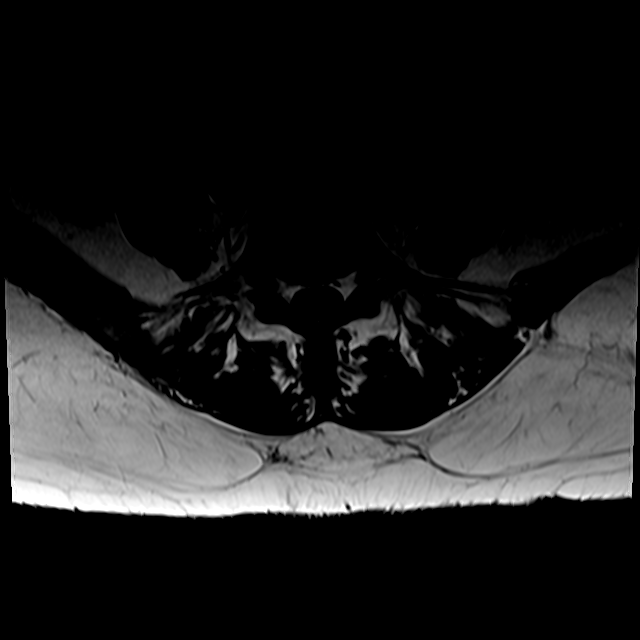
[im 11/35]
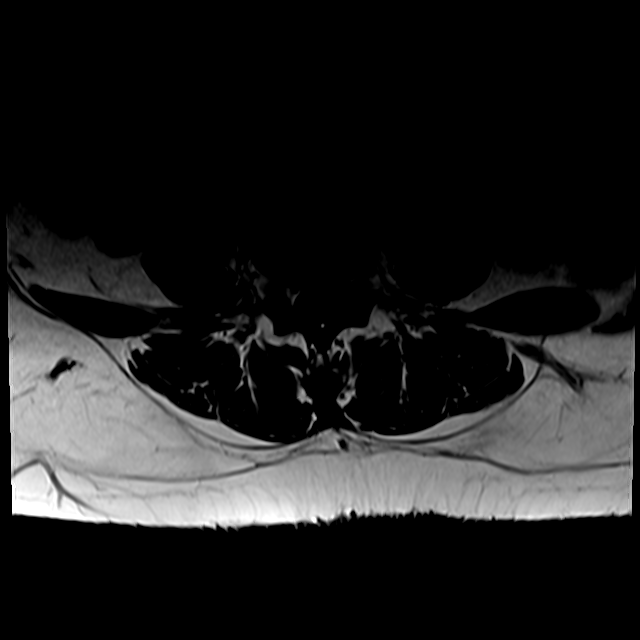
[im 16/35]
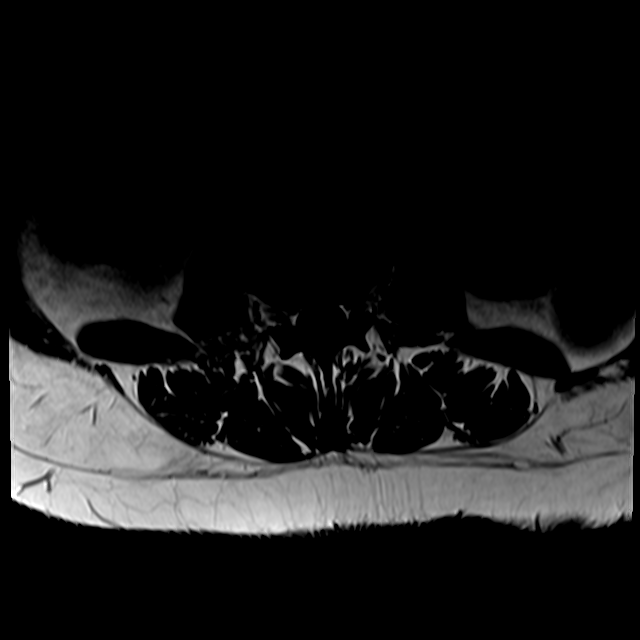
[im 19/35]
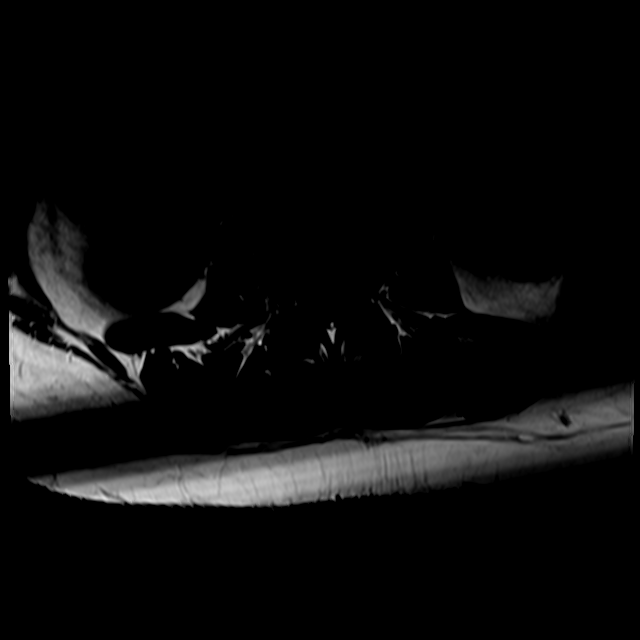
[im 29/35]
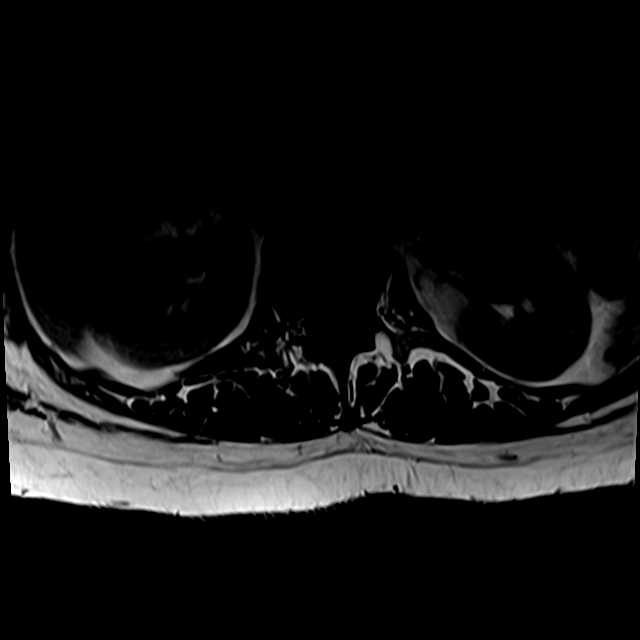

[27 of 48 positions shown; findings below may reference images not displayed]

FINDINGS: Segmentation:  Standard

Alignment:  Normal

Vertebrae:  Normal

Conus medullaris and cauda equina: Conus extends to the L1 level.
Conus and cauda equina appear normal.

Paraspinal and other soft tissues: Negative

Disc levels:

L4-5: Small right subarticular disc protrusion in close proximity to
the descending right L5 nerve root, unchanged. No central spinal
canal or neural foraminal stenosis.

L5-S1: Unchanged right subarticular disc protrusion encroaching on
the descending right S1 nerve root. No central spinal canal or
neural foraminal stenosis.

The other lumbar levels are normal.
IMPRESSION: Unchanged right subarticular disc protrusions at L5-S1 in close
proximity to the descending right S1 nerve root.

## 2020-11-25 MED ORDER — HYDROMORPHONE HCL 1 MG/ML IJ SOLN
1.0000 mg | Freq: Once | INTRAMUSCULAR | Status: AC
  Administered 2020-11-25: 1 mg via INTRAVENOUS
  Filled 2020-11-25: qty 1

## 2020-11-25 MED ORDER — METHYL SALICYLATE-LIDO-MENTHOL 4-4-5 % EX PTCH: 1.0000 | MEDICATED_PATCH | Freq: Two times a day (BID) | CUTANEOUS | 0 refills | Status: DC

## 2020-11-25 NOTE — Discharge Instructions (Addendum)
As discussed, today's MRI was generally reassuring.  It is important that you follow-up with Dr. Erlinda Hong for appropriate ongoing care.  Return here for concerning changes.

## 2020-11-25 NOTE — ED Notes (Addendum)
Called [facility] for patient transfer to [name] ED for MRI, accepting [facility]

## 2020-11-25 NOTE — ED Notes (Signed)
Report given to [place]. Agricultural consultant at [employer]. All questions answered.

## 2020-11-25 NOTE — ED Provider Notes (Signed)
11:21 PM I discussed the patient's MRI results after she was transferred here from our affiliated facility.  Patient in no distress, calm alert, speaking clearly. She notes that she is taking muscle relaxers, has a few remaining narcotic tablets.  We reviewed the MRI, abnormal with persistent disc protrusion, without other alarming findings per Patient amenable to following up with her orthopedist in the coming days.  She will start topical anti-inflammatories.   Carmin Muskrat, MD 11/25/20 [id number]

## 2020-11-25 NOTE — ED Notes (Signed)
[facility] at Bedside; Report given to same.

## 2020-11-25 NOTE — ED Notes (Signed)
Patient made aware of transfer process. Significant Other signed Transfer Consent with permission from Patient.

## 2020-11-25 NOTE — ED Triage Notes (Signed)
Patient reports her right leg is numb on the outside and in twisting pain on the inside. Patient reports she was diagnosed with a bulging disc at L4 and L5 is pressing on the root of S1. Patient reports the numbness has increased in the last 24 hours and she is having difficulty walking. Patient denies incontinence episodes.

## 2020-11-25 NOTE — ED Provider Notes (Signed)
[place] EMERGENCY DEPT Provider Note   CSN: [id number] Arrival date & time: 11/25/20  1825     History Chief Complaint  Patient presents with  . Leg Pain  . Numbness    [name] [name] is a 40 y.o. female.  The history is provided by the patient, a relative and medical records.  Leg Pain Injury: no   Pain details:    Quality:  Aching, sharp and pressure   Radiates to:  R leg   Severity:  Severe   Onset quality:  Sudden   Duration:  2 days   Timing:  Constant   Progression:  Unchanged Tetanus status:  Unknown Relieved by:  Nothing Worsened by:  Bearing weight and flexion Ineffective treatments:  None tried Associated symptoms: back pain, numbness and tingling   Associated symptoms: no fatigue, no fever, no itching and no neck pain        Past Medical History:  Diagnosis Date  . Boil 11/20/2014  . Breast mass, right 07/2014  . Colitis    no current problems  . Fibromyalgia   . GERD (gastroesophageal reflux disease)   . Heart murmur    states no known problem  . Sebaceous cyst 11/20/2014    Patient Active Problem List   Diagnosis Date Noted  . Boil 11/20/2014  . Sebaceous cyst 11/20/2014  . Migraine headache with aura 09/19/2013  . Consultation for sterilization 06/01/2013  . Abnormal uterine bleeding (AUB) 05/11/2013  . BV (bacterial vaginosis) 05/11/2013    Past Surgical History:  Procedure Laterality Date  . COLPOSCOPY     x 2  . LAPAROSCOPIC TUBAL LIGATION Bilateral 06/15/2013   Procedure: LAPAROSCOPIC BILATERAL TUBAL LIGATION WITH CAUTERY;  Surgeon: Florian Buff, MD;  Location: [facility];  Service: Gynecology;  Laterality: Bilateral;  . RADIOACTIVE SEED GUIDED EXCISIONAL BREAST BIOPSY Right 08/03/2014   Procedure: RIGHT BREAST SEED GUIDED EXCISION;  Surgeon: Rolm Bookbinder, MD;  Location: [place];  Service: General;  Laterality: Right;  . TUBAL LIGATION    . WISDOM TOOTH EXTRACTION       OB History     Gravida  1   Para  1   Term  1   Preterm      AB      Living  1     SAB      IAB      Ectopic      Multiple      Live Births              Family History  Problem Relation Age of Onset  . COPD Father   . Diabetes Father   . Heart disease Father   . Diverticulosis Father   . Diabetes Sister   . Leukemia Sister   . Diabetes Other   . Cancer Other   . Thyroid disease Other   . Other Mother        shingles    Social History   Tobacco Use  . Smoking status: Former Smoker    Packs/day: 0.00    Years: 0.00    Pack years: 0.00    Types: Cigarettes    Quit date: 12/02/2007    Years since quitting: 12.9  . Smokeless tobacco: Never Used  Vaping Use  . Vaping Use: Never used  Substance Use Topics  . Alcohol use: No  . Drug use: No    Home Medications Prior to Admission medications   Medication Sig Start Date  End Date Taking? Authorizing Provider  diclofenac (VOLTAREN) 75 MG EC tablet Take 1 tablet (75 mg total) by mouth 2 (two) times daily as needed. 11/02/20   Aundra Dubin, PA-C  diclofenac sodium (VOLTAREN) 1 % GEL Apply topically 4 (four) times daily.    [provider]  EPINEPHrine (EPIPEN 2-PAK) 0.3 mg/0.3 mL IJ SOAJ injection Inject into the muscle once.    [provider]  HYDROcodone-acetaminophen (NORCO) 5-325 MG tablet Take 1 tablet by mouth 2 (two) times daily as needed. 11/13/20   Aundra Dubin, PA-C  ibuprofen (ADVIL,MOTRIN) 200 MG tablet Take 200 mg by mouth every 6 (six) hours as needed.    [provider]  Melatonin 1 MG TABS Take by mouth as needed.     [provider]  ondansetron (ZOFRAN ODT) 4 MG disintegrating tablet Take 1 tablet (4 mg total) by mouth every 6 (six) hours as needed for nausea or vomiting. 02/13/19   Ward, Delice Bison, DO  ondansetron (ZOFRAN) 4 MG tablet Take 1 tablet (4 mg total) by mouth every 8 (eight) hours as needed for nausea or vomiting. 11/02/20   Aundra Dubin, PA-C   oxyCODONE-acetaminophen (PERCOCET/ROXICET) 5-325 MG tablet Take 2 tablets by mouth every 6 (six) hours as needed for severe pain. 02/13/19   Ward, Delice Bison, DO  ranitidine (ZANTAC) 150 MG tablet Take 150 mg by mouth 2 (two) times daily.    [provider]    Allergies    Cortisone, Cefdinir, Flexeril [cyclobenzaprine], and Septra ds [sulfamethoxazole-trimethoprim]  Review of Systems   Review of Systems  Constitutional: Negative for chills, diaphoresis, fatigue and fever.  HENT: Negative for congestion.   Respiratory: Negative for cough, chest tightness, shortness of breath and wheezing.   Cardiovascular: Negative for chest pain, palpitations and leg swelling.  Gastrointestinal: Negative for abdominal pain, constipation, diarrhea, nausea and vomiting.  Genitourinary: Negative for dysuria, flank pain and frequency.  Musculoskeletal: Positive for back pain. Negative for neck pain and neck stiffness.  Skin: Negative for itching.  Neurological: Positive for weakness and numbness. Negative for dizziness, light-headedness and headaches.  Psychiatric/Behavioral: Negative for agitation.  All other systems reviewed and are negative.   Physical Exam Updated Vital Signs BP (!) 135/91 (BP Location: Right Arm)   Pulse 98   Temp 98 F (36.7 C) (Oral)   Resp 16   Ht 5\' 2"  (1.575 m)   Wt 68 kg   SpO2 100%   BMI 27.44 kg/m   Physical Exam Vitals and nursing note reviewed.  Constitutional:      General: She is not in acute distress.    Appearance: She is well-developed.  HENT:     Head: Normocephalic and atraumatic.     Nose: No congestion or rhinorrhea.     Mouth/Throat:     Mouth: Mucous membranes are moist.  Eyes:     Conjunctiva/sclera: Conjunctivae normal.     Pupils: Pupils are equal, round, and reactive to light.  Cardiovascular:     Rate and Rhythm: Normal rate and regular rhythm.     Heart sounds: No murmur heard.   Pulmonary:     Effort: Pulmonary effort is  normal. No respiratory distress.     Breath sounds: Normal breath sounds. No wheezing, rhonchi or rales.  Chest:     Chest wall: No tenderness.  Abdominal:     General: Abdomen is flat.     Palpations: Abdomen is soft.     Tenderness: There is  no abdominal tenderness. There is no right CVA tenderness, left CVA tenderness, guarding or rebound.  Musculoskeletal:     Cervical back: Neck supple.     Right lower leg: No edema.     Left lower leg: No edema.  Skin:    General: Skin is warm and dry.     Capillary Refill: Capillary refill takes less than 2 seconds.     Findings: No erythema.  Neurological:     Mental Status: She is alert and oriented to person, place, and time.     Cranial Nerves: No dysarthria or facial asymmetry.     Sensory: Sensory deficit present.     Motor: Weakness present. No abnormal muscle tone.     Comments: Numbness in right leg compared to left.  Faint weakness in right leg compared to left with plantar flexion.  Still able to raise leg.  Straight leg raise positive on right.  Psychiatric:        Mood and Affect: Mood normal.     ED Results / Procedures / Treatments   Labs (all labs ordered are listed, but only abnormal results are displayed) Labs Reviewed - No data to display  EKG None  Radiology No results found.  Procedures Procedures    Medications Ordered in ED Medications  HYDROmorphone (DILAUDID) injection 1 mg (has no administration in time range)    ED Course  I have reviewed the triage vital signs and the nursing notes.  Pertinent labs & imaging results that were available during my care of the patient were reviewed by me and considered in my medical decision making (see chart for details).    MDM Rules/Calculators/A&P                          [name] [name] is a 40 y.o. female with a past medical history significant for GERD, fibromyalgia, migraines, prior breast tumor, and known lumbar disc bulge with nerve root  compression who presents with acute worsening of back pain and new neurologic deficits.  Patient reports that she was cleaning the house yesterday but did not member picking up something heavy or twisting in an awkward way but says after this she had onset of severe pain in her back.  She reports that previously she has had some mild numbness in her leg that was being managed by orthopedics with injections but has never had the significant back pain component.  She reports the pain is now 9 out of 10 in severity in her right low back and radiates all the way down her leg.  She reports it is now numb and feels like it is asleep and she is also having difficulty walking because she reports it feels weak.  She denies any loss of bowel or bladder control.  She denies any other trauma.  She denies any chest pain, abdominal pain, or any pain in the left leg.  Straight leg raise is positive on the right.  On exam, she does have subjective numbness in the right leg compared to the left as well as possible weakness with plantarflexion on the right compared to left.  She did not have significant tenderness in the actual hip joint or the pelvis.  Lungs clear chest nontender.  Abdomen nontender.  Back did not have focal tenderness on exam but the pain was exquisite.  Clinically I am concerned that this could known disc bulge may have shifted or moved while doing her  cleaning yesterday.  With the new and worsened pain than before and the new numbness and weakness reported, I do feel she needs further imaging to evaluate for changes.  Will order the MRI and she will be transported ED to ED for MRI today.  I spoke with Dr. Isla Pence who accept the patient in transfer.  Dr. Erlinda Hong with orthopedics is her back physician who is managing it.  If concerning findings are discovered, dissipate touching base with her team.  She will be given pain medicine while waiting for transport.  Anticipate disposition based on imaging  results.   Final Clinical Impression(s) / ED Diagnoses Final diagnoses:  Severe back pain  Numbness and tingling of right leg  Transient right leg weakness    Clinical Impression: 1. Severe back pain   2. Numbness and tingling of right leg   3. Transient right leg weakness     Disposition: Care transferred to provider at [facility] for reassessment after MRI this evening.  This note was prepared with assistance of Systems analyst. Occasional wrong-word or sound-a-like substitutions may have occurred due to the inherent limitations of voice recognition software.      Saim Almanza, Gwenyth Allegra, MD 11/25/20 [phone]

## 2020-11-26 ENCOUNTER — Encounter: Payer: Self-pay | Admitting: Orthopaedic Surgery

## 2020-11-26 ENCOUNTER — Other Ambulatory Visit: Payer: Self-pay

## 2020-11-26 DIAGNOSIS — M5416 Radiculopathy, lumbar region: Secondary | ICD-10-CM

## 2020-11-26 NOTE — Telephone Encounter (Signed)
Can you make urgent referral to neuro?

## 2020-11-28 ENCOUNTER — Other Ambulatory Visit: Payer: Self-pay | Admitting: Physician Assistant

## 2020-11-28 ENCOUNTER — Other Ambulatory Visit: Payer: Self-pay

## 2020-11-28 ENCOUNTER — Telehealth: Payer: Self-pay

## 2020-11-28 DIAGNOSIS — M5416 Radiculopathy, lumbar region: Secondary | ICD-10-CM

## 2020-11-28 MED ORDER — HYDROCODONE-ACETAMINOPHEN 5-325 MG PO TABS: 1.0000 | ORAL_TABLET | Freq: Two times a day (BID) | ORAL | 0 refills | Status: DC | PRN

## 2020-11-28 NOTE — Telephone Encounter (Signed)
Patient would like RF Hydrocodone  [facility]

## 2020-11-28 NOTE — Telephone Encounter (Signed)
Sent in

## 2020-11-30 ENCOUNTER — Other Ambulatory Visit: Payer: Self-pay

## 2020-11-30 ENCOUNTER — Ambulatory Visit
Admission: RE | Admit: 2020-11-30 | Discharge: 2020-11-30 | Disposition: A | Discharge status: Home / Self Care | Payer: 59 | Source: Ambulatory Visit | Attending: Physician Assistant | Admitting: Physician Assistant

## 2020-11-30 DIAGNOSIS — M5416 Radiculopathy, lumbar region: Secondary | ICD-10-CM

## 2020-11-30 IMAGING — XA Imaging study
2 series · 2 of 2 positions shown · non-contrast
Comparison: none

Addendum:
CLINICAL DATA: 39-year-old female with lumbosacral spondylosis
without myelopathy. She has low back pain extending into the right
lower extremity. MRI [DATE] demonstrates right subarticular disc
protrusion at L5-S1 in close proximity to the descending S1 nerve
root.

[Series 1: ortho standard · 1 of 1 slices shown (1 of 2)]
[im 1/1]
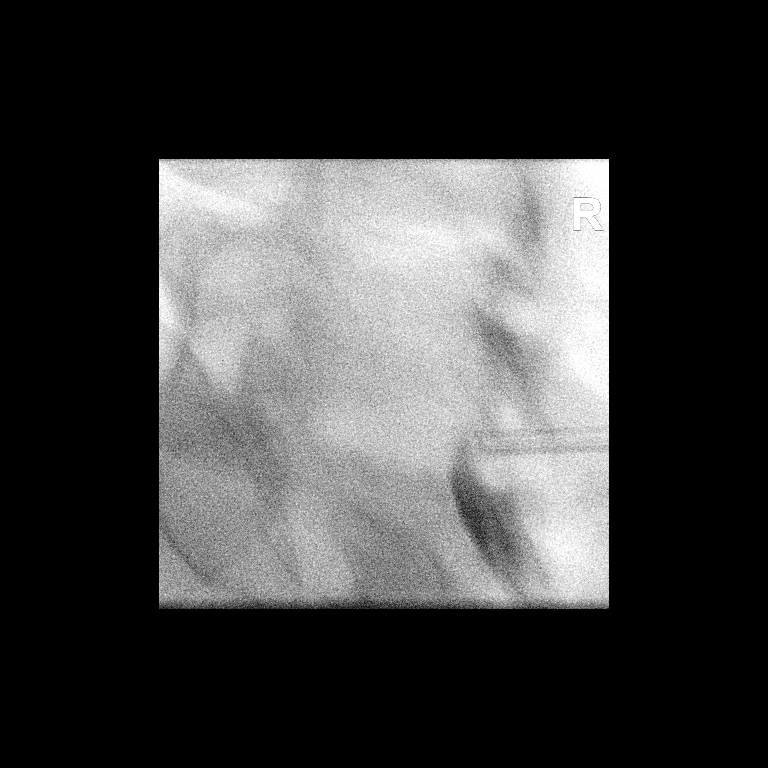

[Series 2: ortho standard · 1 of 1 slices shown (2 of 2)]
[im 1/1]
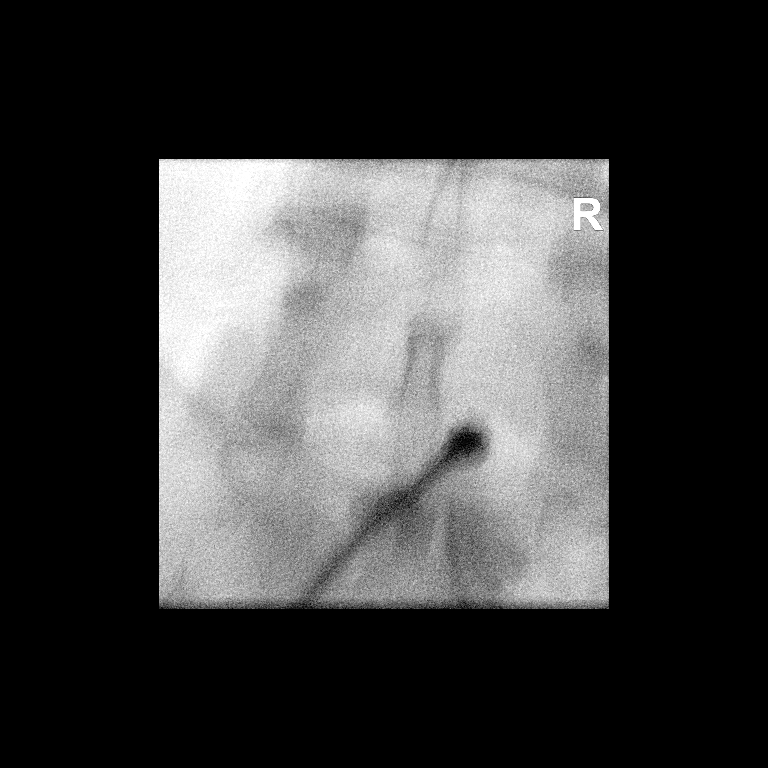

[2 of 2 positions shown; findings below may reference images not displayed]

Prior L5-S1 DEMASIE on [DATE] gave her some relief, however over
this past weekend her symptoms have come back with a vengeance. She
presents for repeat injection to manage her symptoms until she can
see the spine surgeon.

FLUOROSCOPY TIME:  Dictate in minutes and seconds

PROCEDURE:
The procedure, risks, benefits, and alternatives were explained to
the patient. Questions regarding the procedure were encouraged and
answered. The patient understands and consents to the procedure.

LUMBAR EPIDURAL INJECTION:

An interlaminar approach was performed on right at L5-S1. The
overlying skin was cleansed and anesthetized. A 20 gauge epidural
needle was advanced using loss-of-resistance technique.

DIAGNOSTIC EPIDURAL INJECTION:

Injection of Isovue-M 200 shows a good epidural pattern with spread
above and below the level of needle placement, primarily on the
right no vascular opacification is seen.

THERAPEUTIC EPIDURAL INJECTION:

120 Mg of Depo-Medrol mixed with 2 mL 1% lidocaine were instilled.
The procedure was well-tolerated, and the patient was discharged
thirty minutes following the injection in good condition.

COMPLICATIONS:
None.
IMPRESSION: Technically successful epidural injection on the right L5-S1 #2.

FLUOROSCOPY TIME:  0 minutes 21 seconds 1.8 mGy

*** End of Addendum ***
Prior L5-S1 DEMASIE on [DATE] gave her some relief, however over
this past weekend her symptoms have come back with a vengeance. She
presents for repeat injection to manage her symptoms until she can
see the spine surgeon.

FLUOROSCOPY TIME:  Dictate in minutes and seconds

PROCEDURE:
The procedure, risks, benefits, and alternatives were explained to
the patient. Questions regarding the procedure were encouraged and
answered. The patient understands and consents to the procedure.

LUMBAR EPIDURAL INJECTION:

An interlaminar approach was performed on right at L5-S1. The
overlying skin was cleansed and anesthetized. A 20 gauge epidural
needle was advanced using loss-of-resistance technique.

DIAGNOSTIC EPIDURAL INJECTION:

Injection of Isovue-M 200 shows a good epidural pattern with spread
above and below the level of needle placement, primarily on the
right no vascular opacification is seen.

THERAPEUTIC EPIDURAL INJECTION:

120 Mg of Depo-Medrol mixed with 2 mL 1% lidocaine were instilled.
The procedure was well-tolerated, and the patient was discharged
thirty minutes following the injection in good condition.

COMPLICATIONS:
None.
IMPRESSION: Technically successful epidural injection on the right L5-S1 #2.

## 2020-11-30 MED ORDER — METHYLPREDNISOLONE ACETATE 40 MG/ML INJ SUSP (RADIOLOG
120.0000 mg | Freq: Once | INTRAMUSCULAR | Status: AC
  Administered 2020-11-30: 120 mg via EPIDURAL

## 2020-11-30 MED ORDER — IOPAMIDOL (ISOVUE-M 200) INJECTION 41%
1.0000 mL | Freq: Once | INTRAMUSCULAR | Status: AC
  Administered 2020-11-30: 1 mL via EPIDURAL

## 2020-11-30 NOTE — Discharge Instructions (Signed)

## 2021-02-19 ENCOUNTER — Encounter (HOSPITAL_BASED_OUTPATIENT_CLINIC_OR_DEPARTMENT_OTHER): Payer: Self-pay

## 2021-02-19 ENCOUNTER — Emergency Department (HOSPITAL_BASED_OUTPATIENT_CLINIC_OR_DEPARTMENT_OTHER): Payer: 59

## 2021-02-19 ENCOUNTER — Emergency Department (HOSPITAL_BASED_OUTPATIENT_CLINIC_OR_DEPARTMENT_OTHER)
Admission: EM | Admit: 2021-02-19 | Discharge: 2021-02-20 | Disposition: A | Discharge status: Home / Self Care | Payer: 59 | Attending: Emergency Medicine | Admitting: Emergency Medicine

## 2021-02-19 ENCOUNTER — Other Ambulatory Visit: Payer: Self-pay

## 2021-02-19 DIAGNOSIS — N3000 Acute cystitis without hematuria: Secondary | ICD-10-CM | POA: Diagnosis not present

## 2021-02-19 DIAGNOSIS — R109 Unspecified abdominal pain: Secondary | ICD-10-CM | POA: Diagnosis present

## 2021-02-19 DIAGNOSIS — Z20822 Contact with and (suspected) exposure to covid-19: Secondary | ICD-10-CM | POA: Insufficient documentation

## 2021-02-19 DIAGNOSIS — Z87891 Personal history of nicotine dependence: Secondary | ICD-10-CM | POA: Diagnosis not present

## 2021-02-19 LAB — COMPREHENSIVE METABOLIC PANEL
ALT: 10 U/L (ref 0–44)
AST: 14 U/L — ABNORMAL LOW (ref 15–41)
Albumin: 4.2 g/dL (ref 3.5–5.0)
Alkaline Phosphatase: 50 U/L (ref 38–126)
Anion gap: 12 (ref 5–15)
BUN: 9 mg/dL (ref 6–20)
CO2: 24 mmol/L (ref 22–32)
Calcium: 9.3 mg/dL (ref 8.9–10.3)
Chloride: 100 mmol/L (ref 98–111)
Creatinine, Ser: 0.64 mg/dL (ref 0.44–1.00)
GFR, Estimated: >60 mL/min (ref >60)
Glucose, Bld: 106 mg/dL — ABNORMAL HIGH (ref 70–99)
Potassium: 3.6 mmol/L (ref 3.5–5.1)
Sodium: 136 mmol/L (ref 135–145)
Total Bilirubin: 0.5 mg/dL (ref 0.3–1.2)
Total Protein: 7.7 g/dL (ref 6.5–8.1)

## 2021-02-19 LAB — CBC WITH DIFFERENTIAL/PLATELET
Abs Immature Granulocytes: 0.04 10*3/uL (ref 0.00–0.07)
Basophils Absolute: 0 10*3/uL (ref 0.0–0.1)
Basophils Relative: 0 %
Eosinophils Absolute: 0 10*3/uL (ref 0.0–0.5)
Eosinophils Relative: 0 %
HCT: 40.9 % (ref 36.0–46.0)
Hemoglobin: 13.9 g/dL (ref 12.0–15.0)
Immature Granulocytes: 0 %
Lymphocytes Relative: 9 %
Lymphs Abs: 1 10*3/uL (ref 0.7–4.0)
MCH: 30.9 pg (ref 26.0–34.0)
MCHC: 34 g/dL (ref 30.0–36.0)
MCV: 90.9 fL (ref 80.0–100.0)
Monocytes Absolute: 1.2 10*3/uL — ABNORMAL HIGH (ref 0.1–1.0)
Monocytes Relative: 11 %
Neutro Abs: 9 10*3/uL — ABNORMAL HIGH (ref 1.7–7.7)
Neutrophils Relative %: 80 %
Platelets: 244 10*3/uL (ref 150–400)
RBC: 4.5 MIL/uL (ref 3.87–5.11)
RDW: 11.9 % (ref 11.5–15.5)
WBC: 11.2 10*3/uL — ABNORMAL HIGH (ref 4.0–10.5)
nRBC: 0 % (ref 0.0–0.2)

## 2021-02-19 LAB — URINALYSIS, ROUTINE W REFLEX MICROSCOPIC
Bilirubin Urine: NEGATIVE
Glucose, UA: NEGATIVE mg/dL
Hgb urine dipstick: NEGATIVE
Nitrite: NEGATIVE
Protein, ur: NEGATIVE mg/dL
Specific Gravity, Urine: 1.012 (ref 1.005–1.030)
pH: 6.5 (ref 5.0–8.0)

## 2021-02-19 LAB — URINALYSIS, MICROSCOPIC (REFLEX)

## 2021-02-19 LAB — RESP PANEL BY RT-PCR (FLU A&B, COVID) ARPGX2
Influenza A by PCR: NEGATIVE
Influenza B by PCR: NEGATIVE
SARS Coronavirus 2 by RT PCR: NEGATIVE

## 2021-02-19 LAB — LACTIC ACID, PLASMA: Lactic Acid, Venous: 0.9 mmol/L (ref 0.5–1.9)

## 2021-02-19 LAB — PREGNANCY, URINE: Preg Test, Ur: NEGATIVE

## 2021-02-19 LAB — LIPASE, BLOOD: Lipase: 17 U/L (ref 11–51)

## 2021-02-19 IMAGING — CT CT ABD-PELV W/O CM
2 of 4 series · 16 of 46 positions shown, 18 images · non-contrast
Comparison: Remote exam [DATE]

CLINICAL DATA: Abdominal pain and fever.  Diverticulitis suspected.

EXAM:
CT ABDOMEN AND PELVIS WITHOUT CONTRAST
TECHNIQUE: Multidetector CT imaging of the abdomen and pelvis was performed
following the standard protocol without IV contrast.

[Series 2: abd pel wo · axial · 0.68mm/px · z∈[-887,-477]mm · 13 of 90 slices shown, 15 images]
[im 4/90  soft-tissue]
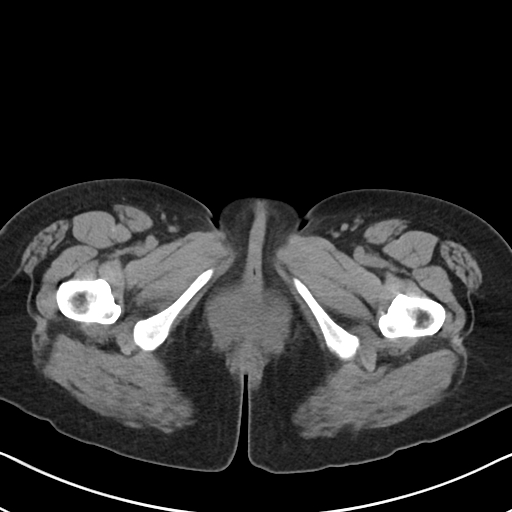
[im 4/90  bone]
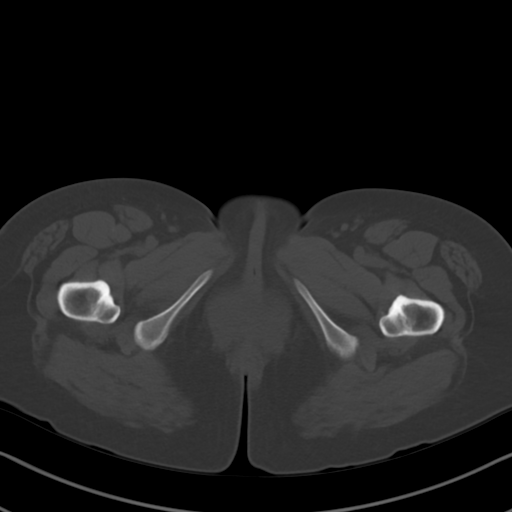
[im 12/90  soft-tissue]
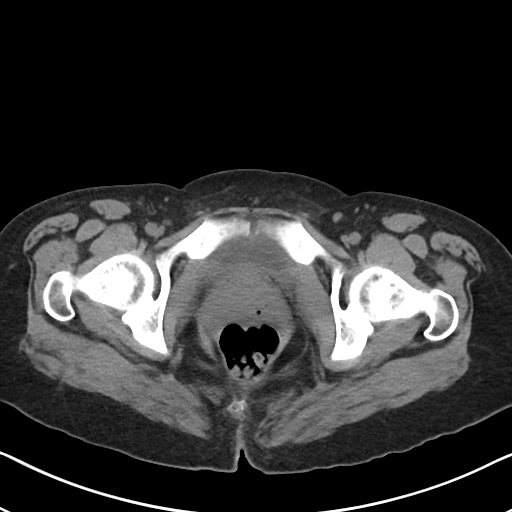
[im 20/90  soft-tissue]
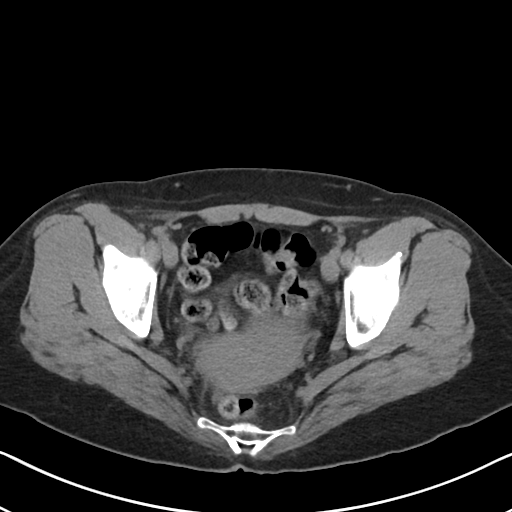
[im 24/90  soft-tissue]
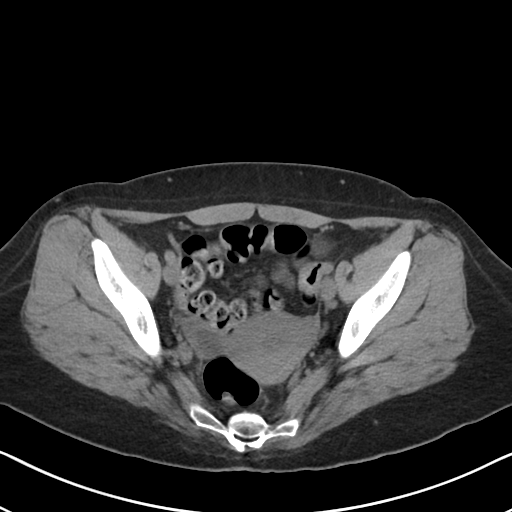
[im 31/90  soft-tissue]
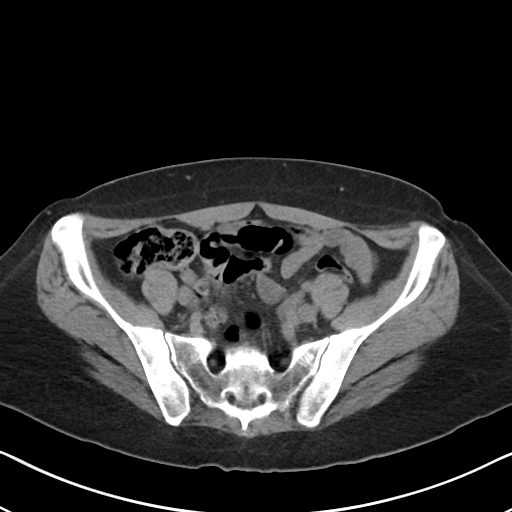
[im 39/90  soft-tissue]
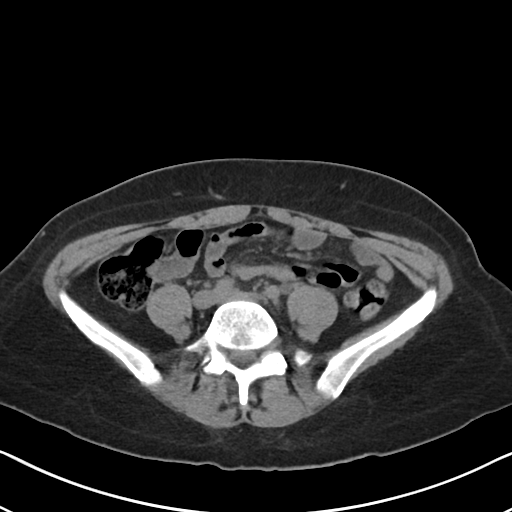
[im 47/90  soft-tissue]
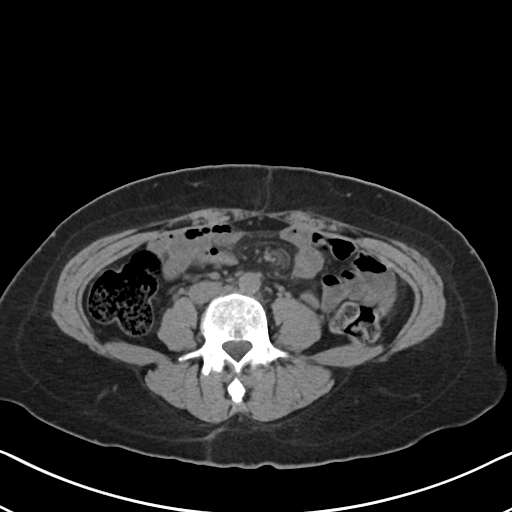
[im 51/90  soft-tissue]
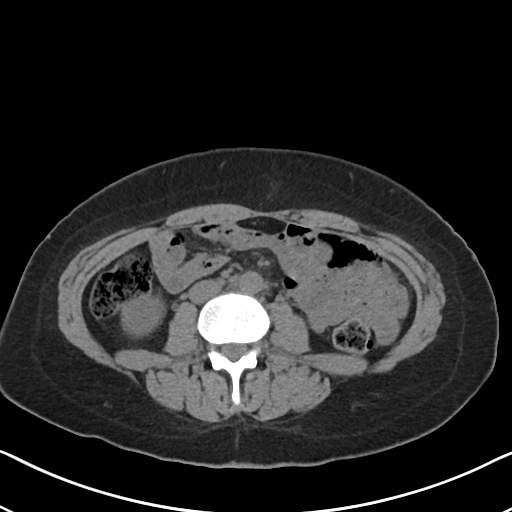
[im 59/90  soft-tissue]
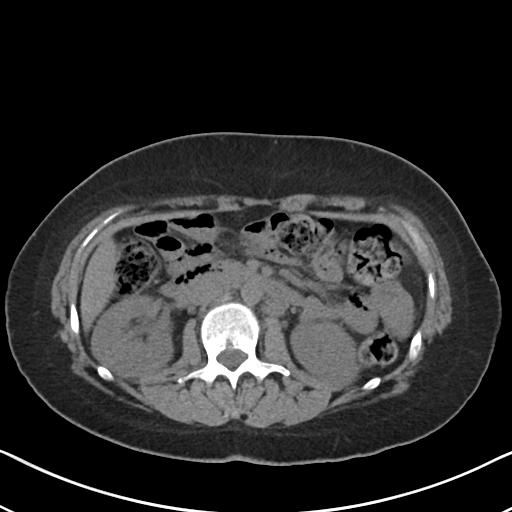
[im 59/90  bone]
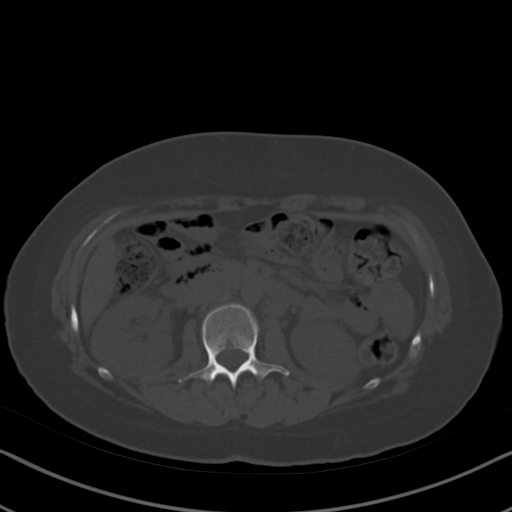
[im 66/90  soft-tissue]
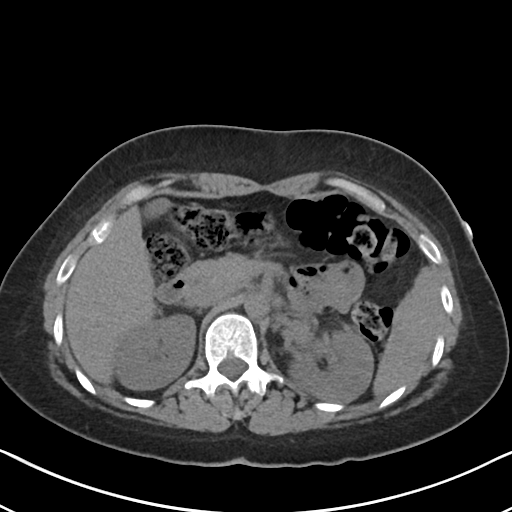
[im 70/90  soft-tissue]
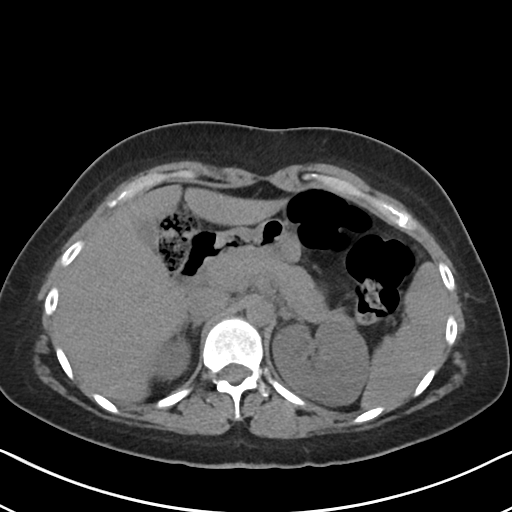
[im 78/90  soft-tissue]
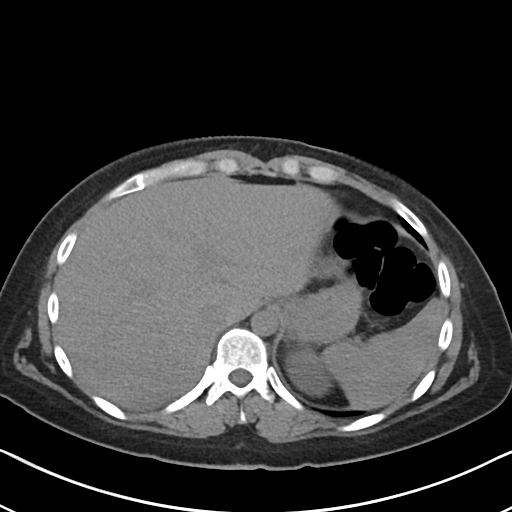
[im 86/90  soft-tissue]
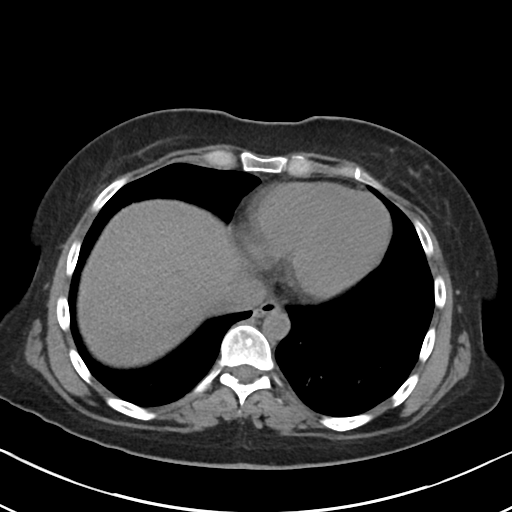

[Series 5: coronal · coronal · 0.73mm/px · 3 of 78 slices shown]
[im 26/78  soft-tissue]
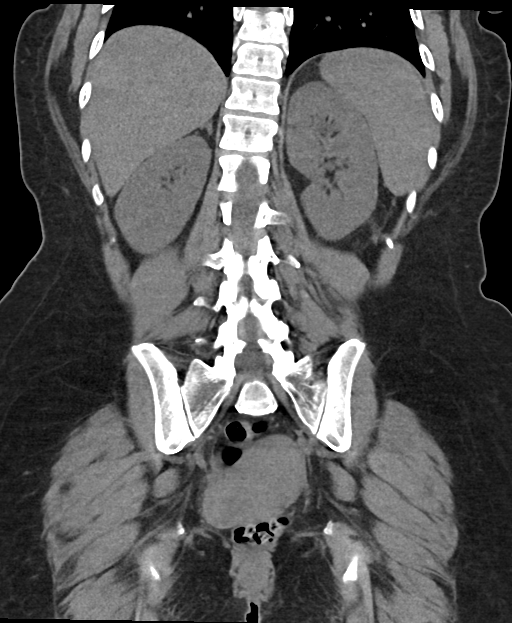
[im 35/78  soft-tissue]
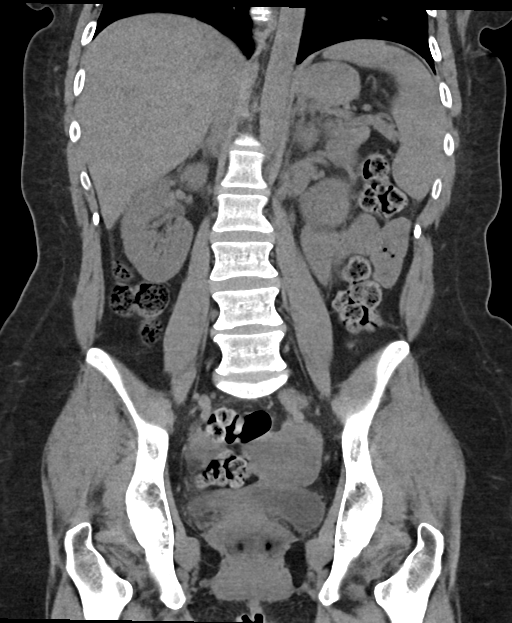
[im 43/78  soft-tissue]
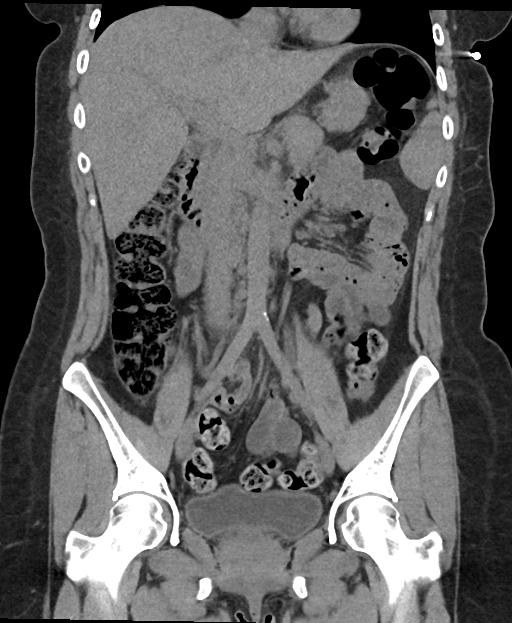

[16 of 46 positions shown; findings below may reference images not displayed]

FINDINGS: Lower chest: Lung bases are clear.

Hepatobiliary: No focal liver abnormality is seen. No gallstones,
gallbladder wall thickening, or biliary dilatation.

Pancreas: No ductal dilatation or inflammation.

Spleen: Normal in size without focal abnormality.

Adrenals/Urinary Tract: No adrenal nodule. Punctate nonobstructing
stone in the upper left kidney. No hydronephrosis. No perinephric
edema. No evidence of focal renal abnormality. Decompressed ureters
without ureteral calculi. Partially distended urinary bladder.

Stomach/Bowel: Stomach and small bowel are unremarkable. No
obstruction or inflammation. Normal air-filled appendix. Moderate
diffuse colonic stool burden. No colonic wall thickening or
inflammation. Sigmoid colon is redundant. No significant
diverticular disease.

Vascular/Lymphatic: Abdominal aorta is normal in caliber. There is
no portal venous or mesenteric gas. Small retroperitoneal lymph
nodes are similar to prior exam. No enlarged lymph nodes in the
abdomen or pelvis.

Reproductive: Anteverted uterus, unremarkable on noncontrast exam.
No ovarian cyst or adnexal mass.

Other: No free air, free fluid, or focal fluid collection. No
abdominal wall hernia.

Musculoskeletal: There are no acute or suspicious osseous
abnormalities. Occasional bone islands in the pelvis.
IMPRESSION: 1. No acute abnormality or explanation for abdominal pain.
2. Punctate nonobstructing stone in the upper left kidney.
3. Moderate colonic stool burden, can be seen with constipation.

## 2021-02-19 MED ORDER — ONDANSETRON HCL 4 MG/2ML IJ SOLN
4.0000 mg | Freq: Once | INTRAMUSCULAR | Status: AC
  Administered 2021-02-19: 4 mg via INTRAVENOUS
  Filled 2021-02-19: qty 2

## 2021-02-19 MED ORDER — SODIUM CHLORIDE 0.9 % IV BOLUS
1000.0000 mL | Freq: Once | INTRAVENOUS | Status: AC
  Administered 2021-02-19: 1000 mL via INTRAVENOUS

## 2021-02-19 MED ORDER — NITROFURANTOIN MONOHYD MACRO 100 MG PO CAPS: 100.0000 mg | ORAL_CAPSULE | Freq: Two times a day (BID) | ORAL | 0 refills | Status: AC

## 2021-02-19 MED ORDER — NITROFURANTOIN MONOHYD MACRO 100 MG PO CAPS
100.0000 mg | ORAL_CAPSULE | Freq: Once | ORAL | Status: AC
  Administered 2021-02-19: 100 mg via ORAL
  Filled 2021-02-19: qty 1

## 2021-02-19 NOTE — ED Triage Notes (Signed)
Pt c/o fever and abdominal pain that started yesterday. Pt states she has been alternating tylenol and ibuprofen which has not been helping per pt. Pt temp today is 99.7.

## 2021-02-19 NOTE — ED Provider Notes (Signed)
Emergency Department Provider Note   I have reviewed the triage vital signs and the nursing notes.   HISTORY  Chief Complaint Fever and Abdominal Pain   HPI [name] [name] is a 40 y.o. female with past medical history reviewed below presents to the emergency department for evaluation of abdominal pain with intermittent fevers.  Symptoms began yesterday and have progressively worsened.  She is been alternating Tylenol and ibuprofen with no relief in symptoms.  She denies vomiting but has had some nausea.  No diarrhea.  She did have a lumbar spine surgery with Dr. Venetia Constable and has been recovering well.  No swelling at the incision site or drainage.  No numbness or tingling in the legs.  She denies any dysuria, hesitancy, urgency.  No vaginal bleeding or discharge.  Pain in the abdomen seems mainly in the left and described as a fullness which becomes severe at times.    Past Medical History:  Diagnosis Date   Boil 11/20/2014   Breast mass, right 07/2014   Colitis    no current problems   Fibromyalgia    GERD (gastroesophageal reflux disease)    Heart murmur    states no known problem   Sebaceous cyst 11/20/2014    Patient Active Problem List   Diagnosis Date Noted   Boil 11/20/2014   Sebaceous cyst 11/20/2014   Migraine headache with aura 09/19/2013   Consultation for sterilization 06/01/2013   Abnormal uterine bleeding (AUB) 05/11/2013   BV (bacterial vaginosis) 05/11/2013    Past Surgical History:  Procedure Laterality Date   COLPOSCOPY     x 2   LAPAROSCOPIC TUBAL LIGATION Bilateral 06/15/2013   Procedure: LAPAROSCOPIC BILATERAL TUBAL LIGATION WITH CAUTERY;  Surgeon: Florian Buff, MD;  Location: [facility];  Service: Gynecology;  Laterality: Bilateral;   RADIOACTIVE SEED GUIDED EXCISIONAL BREAST BIOPSY Right 08/03/2014   Procedure: RIGHT BREAST SEED GUIDED EXCISION;  Surgeon: Rolm Bookbinder, MD;  Location: [facility];  Service: General;   Laterality: Right;   TUBAL LIGATION     WISDOM TOOTH EXTRACTION      Allergies Cortisone, Cefdinir, Flexeril [cyclobenzaprine], and Septra ds [sulfamethoxazole-trimethoprim]  Family History  Problem Relation Age of Onset   COPD Father    Diabetes Father    Heart disease Father    Diverticulosis Father    Diabetes Sister    Leukemia Sister    Diabetes Other    Cancer Other    Thyroid disease Other    Other Mother        shingles    Social History Social History   Tobacco Use   Smoking status: Former    Packs/day: 0.00    Years: 0.00    Pack years: 0.00    Types: Cigarettes    Quit date: 12/02/2007    Years since quitting: 13.2   Smokeless tobacco: Never  Vaping Use   Vaping Use: Never used  Substance Use Topics   Alcohol use: No   Drug use: No    Review of Systems  Constitutional: Positive intermittent fever/chills Eyes: No visual changes. ENT: No sore throat. Cardiovascular: Denies chest pain. Respiratory: Denies shortness of breath. Gastrointestinal: Positive left sided abdominal pain. Positive nausea, no vomiting.  No diarrhea.  No constipation. Genitourinary: Negative for dysuria. Musculoskeletal: Post-op back pain but not worsening suddenly.  Skin: Negative for rash. Neurological: Negative for headaches, focal weakness or numbness.  10-point ROS otherwise negative.  ____________________________________________   PHYSICAL EXAM:  VITAL SIGNS:  ED Triage Vitals  Enc Vitals Group     BP 02/19/21 1942 (!) 119/58     Pulse Rate 02/19/21 1942 (!) 139     Resp 02/19/21 1942 18     Temp 02/19/21 1942 99.7 F (37.6 C)     Temp Source 02/19/21 1942 Oral     SpO2 02/19/21 1942 96 %     Weight 02/19/21 1943 155 lb (70.3 kg)     Height 02/19/21 1943 5\' 2"  (1.575 m)   Constitutional: Alert and oriented. Well appearing and in no acute distress. Eyes: Conjunctivae are normal.  Head: Atraumatic. Nose: No congestion/rhinnorhea. Mouth/Throat: Mucous  membranes are slightly dry.  Cardiovascular: Tachycardia on arrival but improved on my exam. Good peripheral circulation. Grossly normal heart sounds.   Respiratory: Normal respiratory effort.  No retractions. Lungs CTAB. Gastrointestinal: Soft with tenderness mainly in the left lower abdomen. No rebound or guarding. No distention.  Musculoskeletal: No lower extremity tenderness nor edema. No gross deformities of extremities. Neurologic:  Normal speech and language. No gross focal neurologic deficits are appreciated.  Skin:  Skin is warm, dry and intact. No rash noted. Lumbar spine incision is well appearing and without surrounding erythema, warmth, or drainage.   ____________________________________________   LABS (all labs ordered are listed, but only abnormal results are displayed)  Labs Reviewed  URINE CULTURE - Abnormal; Notable for the following components:      Result Value   Culture >=100,000 COLONIES/mL ESCHERICHIA COLI (*)    All other components within normal limits  COMPREHENSIVE METABOLIC PANEL - Abnormal; Notable for the following components:   Glucose, Bld 106 (*)    AST 14 (*)    All other components within normal limits  CBC WITH DIFFERENTIAL/PLATELET - Abnormal; Notable for the following components:   WBC 11.2 (*)    Neutro Abs 9.0 (*)    Monocytes Absolute 1.2 (*)    All other components within normal limits  URINALYSIS, ROUTINE W REFLEX MICROSCOPIC - Abnormal; Notable for the following components:   Ketones, ur PRESENT (*)    Leukocytes,Ua MODERATE (*)    All other components within normal limits  URINALYSIS, MICROSCOPIC (REFLEX) - Abnormal; Notable for the following components:   Bacteria, UA FEW (*)    All other components within normal limits  CULTURE, BLOOD (ROUTINE X 2)  CULTURE, BLOOD (ROUTINE X 2)  RESP PANEL BY RT-PCR (FLU A&B, COVID) ARPGX2  LIPASE, BLOOD  LACTIC ACID, PLASMA  PREGNANCY, URINE    ____________________________________________  EKG   EKG Interpretation  Date/Time:  Tuesday February 19 2021 19:51:13 EDT Ventricular Rate:  147 PR Interval:  104 QRS Duration: 74 QT Interval:  342 QTC Calculation: 535 R Axis:   50 Text Interpretation: Sinus tachycardia with short PR Nonspecific ST changes likely rate related Abnormal ECG No old tracing for comparison Confirmed by Nanda Quinton [phone]) on 02/19/2021 8:43:51 PM         ____________________________________________  RADIOLOGY  CT abdomen/pelvis reviewed.   ____________________________________________   PROCEDURES  Procedure(s) performed:   Procedures  None ____________________________________________   INITIAL IMPRESSION / ASSESSMENT AND PLAN / ED COURSE  Pertinent labs & imaging results that were available during my care of the patient were reviewed by me and considered in my medical decision making (see chart for details).   Patient presents to the emergency department with left-sided abdominal pain with intermittent fevers and nausea.  She has mild tenderness on exam.  She arrived to the emergency department with  very significant tachycardia and borderline fever.  She felt like, by the patient's description, that she was feeling very anxious on arrival.  She has never been hypotensive.  Vital signs of normalized arrival to the acute care area.  Low suspicion clinically for sepsis.  Patient does have some tenderness in the left abdomen along with a mild leukocytosis on labs.  Plan for CT abdomen pelvis.  Ability to use IV contrast is limited by [employer].  Lactic acid is normal.  UA is in process.  Pregnancy negative.  This does not seem consistent with complication from lumbar spine surgery.   UA consistent with UTI. Has multiple abx allergies. Having back pain but pain associated with recent spine surgery. No CVA tenderness. No radiographic pyelo on CT. Will start Macrobid with no clinical concern  for pyelo.   At this time, I do not feel there is any life-threatening condition present. I have reviewed and discussed all results (EKG, imaging, lab, urine as appropriate), exam findings with patient. I have reviewed nursing notes and appropriate previous records.  I feel the patient is safe to be discharged home without further emergent workup. Discussed usual and customary return precautions. Patient and family (if present) verbalize understanding and are comfortable with this plan.  Patient will follow-up with their primary care provider. If they do not have a primary care provider, information for follow-up has been provided to them. All questions have been answered.  ____________________________________________  FINAL CLINICAL IMPRESSION(S) / ED DIAGNOSES  Final diagnoses:  Acute cystitis without hematuria     MEDICATIONS GIVEN DURING THIS VISIT:  Medications  sodium chloride 0.9 % bolus 1,000 mL (0 mLs Intravenous Stopped 02/20/21 0003)  ondansetron (ZOFRAN) injection 4 mg (4 mg Intravenous Given 02/19/21 2158)  nitrofurantoin (macrocrystal-monohydrate) (MACROBID) capsule 100 mg (100 mg Oral Given 02/19/21 2357)     NEW OUTPATIENT MEDICATIONS STARTED DURING THIS VISIT:  Discharge Medication List as of 02/19/2021 11:45 PM     START taking these medications   Details  nitrofurantoin, macrocrystal-monohydrate, (MACROBID) 100 MG capsule Take 1 capsule (100 mg total) by mouth 2 (two) times daily for 7 days., Starting Tue 02/19/2021, Until Tue 02/26/2021, Normal        Note:  This document was prepared using Dragon voice recognition software and may include unintentional dictation errors.  Nanda Quinton, MD, [facility] Emergency Medicine    Meygan Kyser, Wonda Old[employer], MD 02/21/21 [phone]

## 2021-02-19 NOTE — Discharge Instructions (Addendum)

## 2021-02-22 LAB — URINE CULTURE: Culture: >=100000 — AB

## 2021-02-24 ENCOUNTER — Telehealth: Payer: Self-pay | Admitting: Emergency Medicine

## 2021-02-24 NOTE — Telephone Encounter (Signed)
Post ED Visit - Positive Culture Follow-up  Culture report reviewed by antimicrobial stewardship pharmacist: [facility] Team []  Elenor Quinones, Pharm.D. []  Heide Guile, Pharm.D., BCPS AQ-ID []  Parks Neptune, Pharm.D., BCPS []  Alycia Rossetti, Pharm.D., BCPS []  Banks, Pharm.D., BCPS, AAHIVP []  Legrand Como, Pharm.D., BCPS, AAHIVP []  Salome Arnt, PharmD, BCPS []  Johnnette Gourd, PharmD, BCPS []  Hughes Better, PharmD, BCPS []  Leeroy Cha, PharmD []  Laqueta Linden, PharmD, BCPS [x]  Albertina Parr, PharmD  [facility] Team []  Leodis Sias, PharmD []  Lindell Spar, PharmD []  Royetta Asal, PharmD []  Graylin Shiver, Rph []  Rema Fendt) Glennon Mac, PharmD []  Arlyn Dunning, PharmD []  Netta Cedars, PharmD []  Dia Sitter, PharmD []  Leone Haven, PharmD []  Gretta Arab, PharmD []  Theodis Shove, PharmD []  Peggyann Juba, PharmD []  Reuel Boom, PharmD   Positive urine culture Treated with Nitrodurantoin, organism sensitive to the same and no further patient follow-up is required at this time.  Milus Mallick 02/24/2021, 6:16 PM

## 2021-02-25 LAB — CULTURE, BLOOD (ROUTINE X 2)
Culture: NO GROWTH
Culture: NO GROWTH
Specimen Description: ADEQUATE
Specimen Description: ADEQUATE

## 2021-08-26 ENCOUNTER — Emergency Department (HOSPITAL_COMMUNITY): Payer: 59

## 2021-08-26 ENCOUNTER — Inpatient Hospital Stay (HOSPITAL_COMMUNITY)
Admission: EM | Admit: 2021-08-26 | Discharge: 2021-08-30 | DRG: 460 | Disposition: A | Discharge status: Home / Self Care | Payer: 59 | Attending: Neurological Surgery | Admitting: Neurological Surgery

## 2021-08-26 DIAGNOSIS — Z9851 Tubal ligation status: Secondary | ICD-10-CM

## 2021-08-26 DIAGNOSIS — Z79899 Other long term (current) drug therapy: Secondary | ICD-10-CM

## 2021-08-26 DIAGNOSIS — Z8249 Family history of ischemic heart disease and other diseases of the circulatory system: Secondary | ICD-10-CM | POA: Diagnosis not present

## 2021-08-26 DIAGNOSIS — Z419 Encounter for procedure for purposes other than remedying health state, unspecified: Secondary | ICD-10-CM

## 2021-08-26 DIAGNOSIS — Z806 Family history of leukemia: Secondary | ICD-10-CM | POA: Diagnosis not present

## 2021-08-26 DIAGNOSIS — Z825 Family history of asthma and other chronic lower respiratory diseases: Secondary | ICD-10-CM

## 2021-08-26 DIAGNOSIS — Z20822 Contact with and (suspected) exposure to covid-19: Secondary | ICD-10-CM | POA: Diagnosis present

## 2021-08-26 DIAGNOSIS — M4316 Spondylolisthesis, lumbar region: Secondary | ICD-10-CM | POA: Diagnosis present

## 2021-08-26 DIAGNOSIS — Z833 Family history of diabetes mellitus: Secondary | ICD-10-CM

## 2021-08-26 DIAGNOSIS — Z87891 Personal history of nicotine dependence: Secondary | ICD-10-CM | POA: Diagnosis not present

## 2021-08-26 DIAGNOSIS — M5416 Radiculopathy, lumbar region: Secondary | ICD-10-CM | POA: Diagnosis present

## 2021-08-26 DIAGNOSIS — M21371 Foot drop, right foot: Secondary | ICD-10-CM | POA: Diagnosis present

## 2021-08-26 DIAGNOSIS — M5441 Lumbago with sciatica, right side: Secondary | ICD-10-CM

## 2021-08-26 DIAGNOSIS — M5117 Intervertebral disc disorders with radiculopathy, lumbosacral region: Principal | ICD-10-CM | POA: Diagnosis present

## 2021-08-26 DIAGNOSIS — M797 Fibromyalgia: Secondary | ICD-10-CM | POA: Diagnosis present

## 2021-08-26 DIAGNOSIS — K219 Gastro-esophageal reflux disease without esophagitis: Secondary | ICD-10-CM | POA: Diagnosis present

## 2021-08-26 DIAGNOSIS — M549 Dorsalgia, unspecified: Secondary | ICD-10-CM | POA: Diagnosis present

## 2021-08-26 DIAGNOSIS — M5116 Intervertebral disc disorders with radiculopathy, lumbar region: Secondary | ICD-10-CM | POA: Diagnosis present

## 2021-08-26 LAB — CBC WITH DIFFERENTIAL/PLATELET
Abs Immature Granulocytes: 0.01 10*3/uL (ref 0.00–0.07)
Basophils Absolute: 0 10*3/uL (ref 0.0–0.1)
Basophils Relative: 1 %
Eosinophils Absolute: 0 10*3/uL (ref 0.0–0.5)
Eosinophils Relative: 0 %
HCT: 43.2 % (ref 36.0–46.0)
Hemoglobin: 14.8 g/dL (ref 12.0–15.0)
Immature Granulocytes: 0 %
Lymphocytes Relative: 42 %
Lymphs Abs: 1.8 10*3/uL (ref 0.7–4.0)
MCH: 31.4 pg (ref 26.0–34.0)
MCHC: 34.3 g/dL (ref 30.0–36.0)
MCV: 91.7 fL (ref 80.0–100.0)
Monocytes Absolute: 0.4 10*3/uL (ref 0.1–1.0)
Monocytes Relative: 9 %
Neutro Abs: 2.1 10*3/uL (ref 1.7–7.7)
Neutrophils Relative %: 48 %
Platelets: 255 10*3/uL (ref 150–400)
RBC: 4.71 MIL/uL (ref 3.87–5.11)
RDW: 11.8 % (ref 11.5–15.5)
WBC: 4.4 10*3/uL (ref 4.0–10.5)
nRBC: 0 % (ref 0.0–0.2)

## 2021-08-26 LAB — BASIC METABOLIC PANEL
Anion gap: 9 (ref 5–15)
BUN: 10 mg/dL (ref 6–20)
CO2: 24 mmol/L (ref 22–32)
Calcium: 9.9 mg/dL (ref 8.9–10.3)
Chloride: 105 mmol/L (ref 98–111)
Creatinine, Ser: 0.63 mg/dL (ref 0.44–1.00)
GFR, Estimated: >60 mL/min (ref >60)
Glucose, Bld: 111 mg/dL — ABNORMAL HIGH (ref 70–99)
Potassium: 3.9 mmol/L (ref 3.5–5.1)
Sodium: 138 mmol/L (ref 135–145)

## 2021-08-26 LAB — I-STAT BETA HCG BLOOD, ED (MC, WL, AP ONLY): I-stat hCG, quantitative: <5 m[IU]/mL (ref <5)

## 2021-08-26 IMAGING — CT CT L SPINE W/O CM
3 of 4 series · 13 of 33 positions shown, 16 images · non-contrast
Comparison: Correlation made with MRI from [DATE]

CLINICAL DATA: Low back pain, symptoms persist with > 6 wks
treatment

EXAM:
CT LUMBAR SPINE WITHOUT CONTRAST
TECHNIQUE: Multidetector CT imaging of the lumbar spine was performed without
intravenous contrast administration. Multiplanar CT image
reconstructions were also generated.

[Series 5: l spine 2.0 st · axial · 0.31mm/px · z∈[+955,+1177]mm · 7 of 133 slices shown, 9 images]
[im 11/133  soft-tissue]
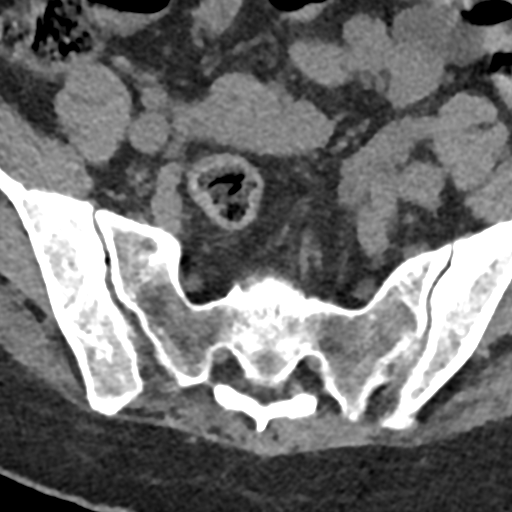
[im 11/133  bone]
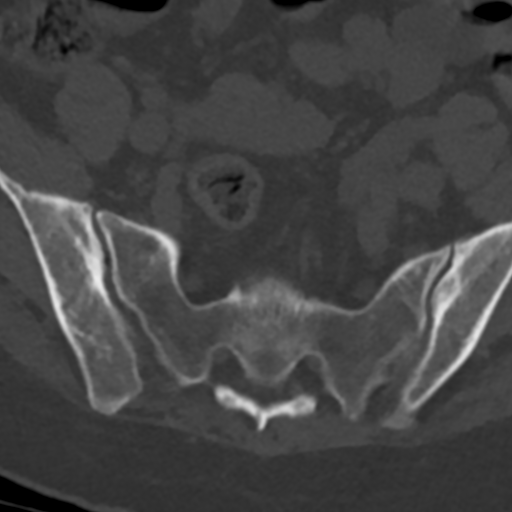
[im 31/133  bone]
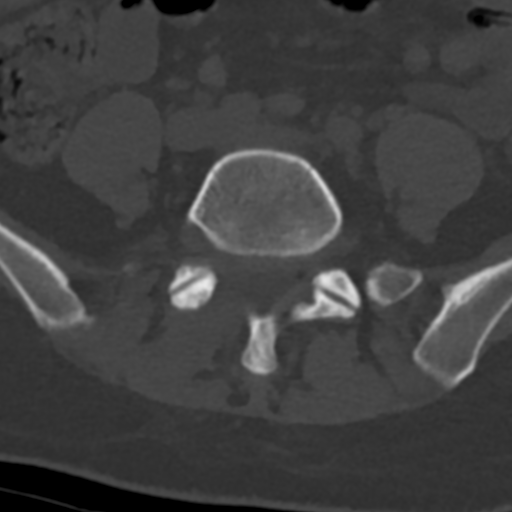
[im 51/133  bone]
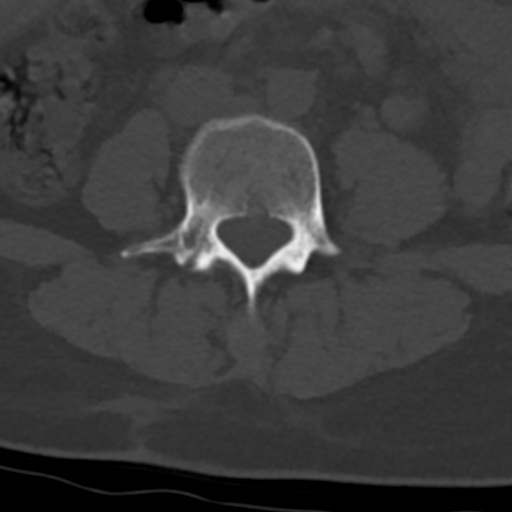
[im 72/133  bone]
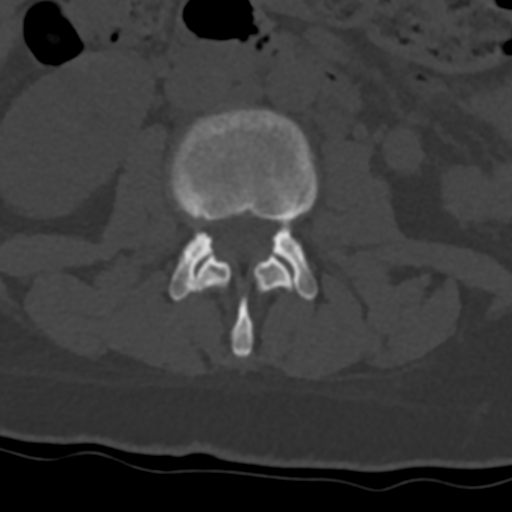
[im 82/133  soft-tissue]
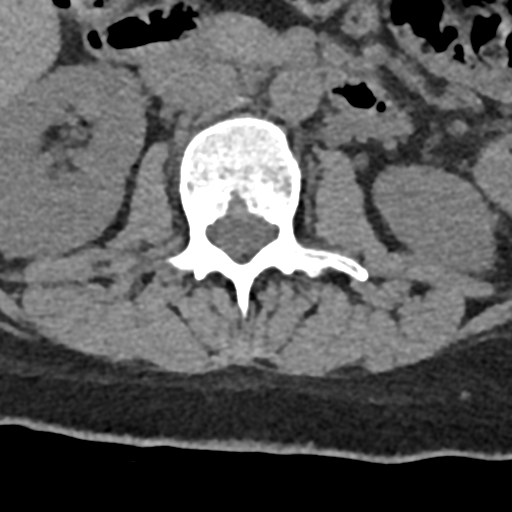
[im 82/133  bone]
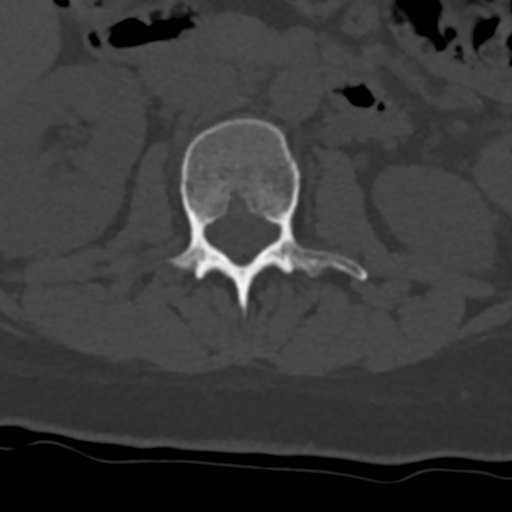
[im 102/133  bone]
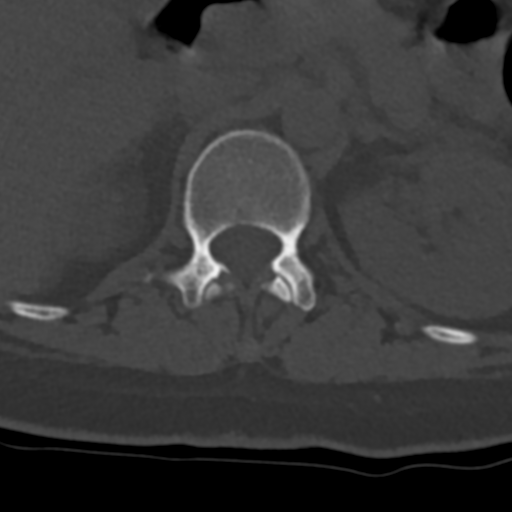
[im 122/133  bone]
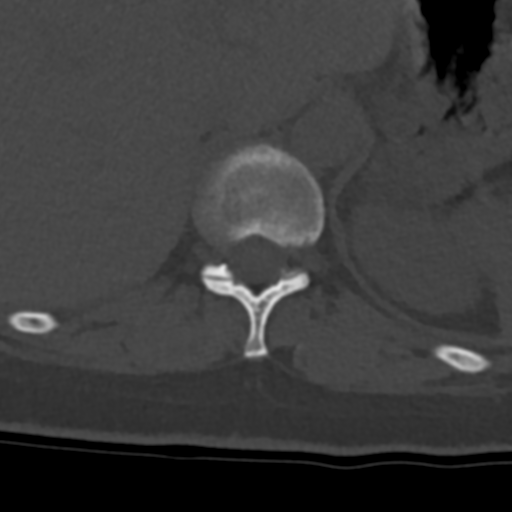

[Series 6: coronal bone · coronal · 0.36mm/px · 1 of 68 slices shown]
[im 34/68  bone]
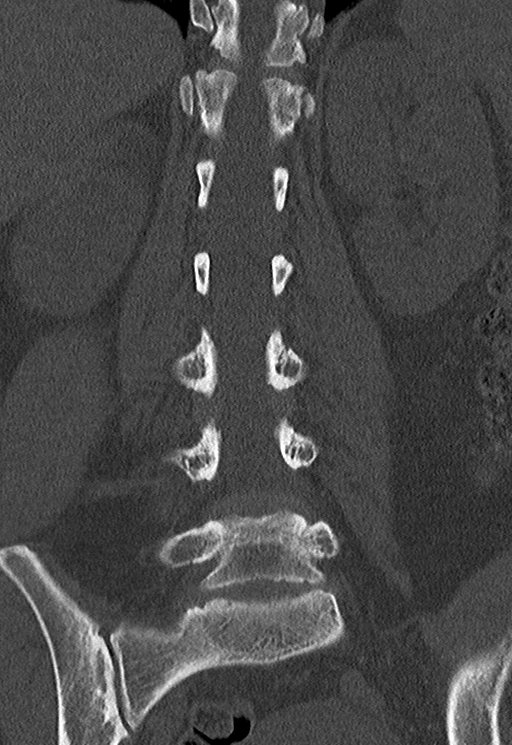

[Series 9: sagittal st · sagittal · 0.33mm/px · 5 of 67 slices shown, 6 images]
[im 23/67  bone]
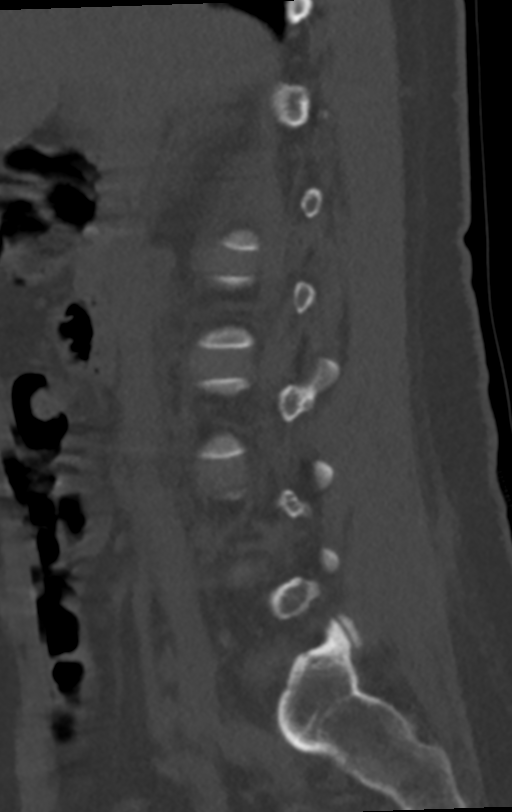
[im 28/67  bone]
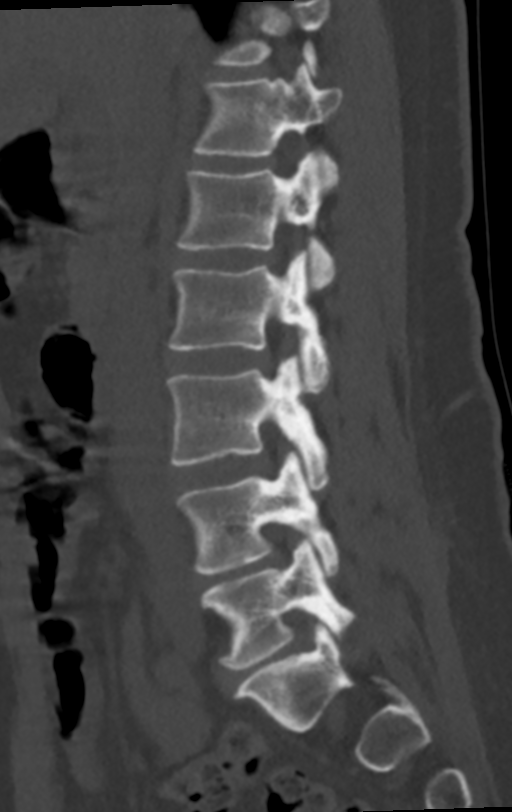
[im 34/67  soft-tissue]
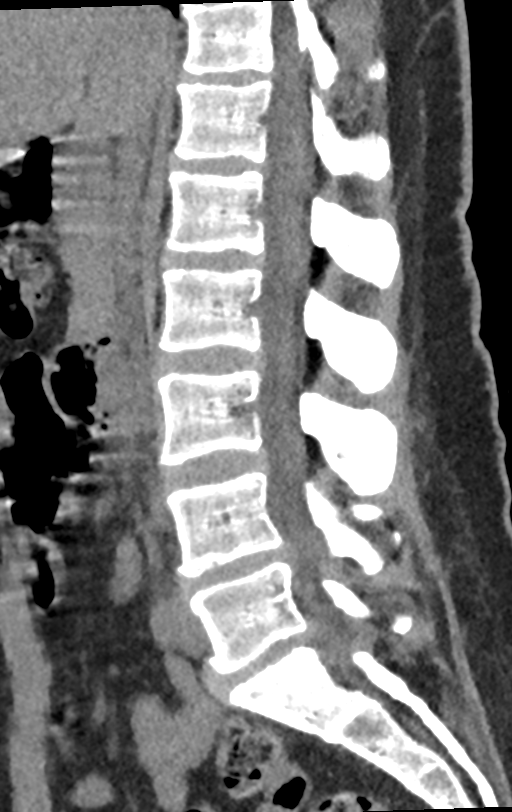
[im 34/67  bone]
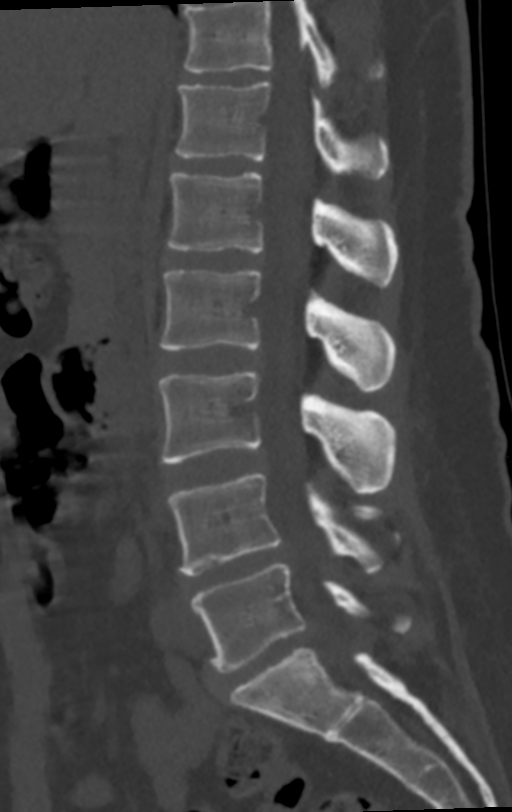
[im 39/67  bone]
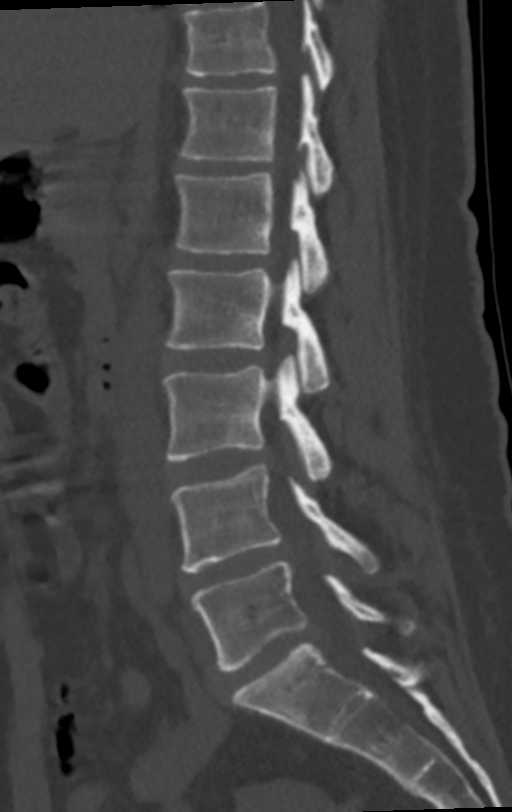
[im 45/67  bone]
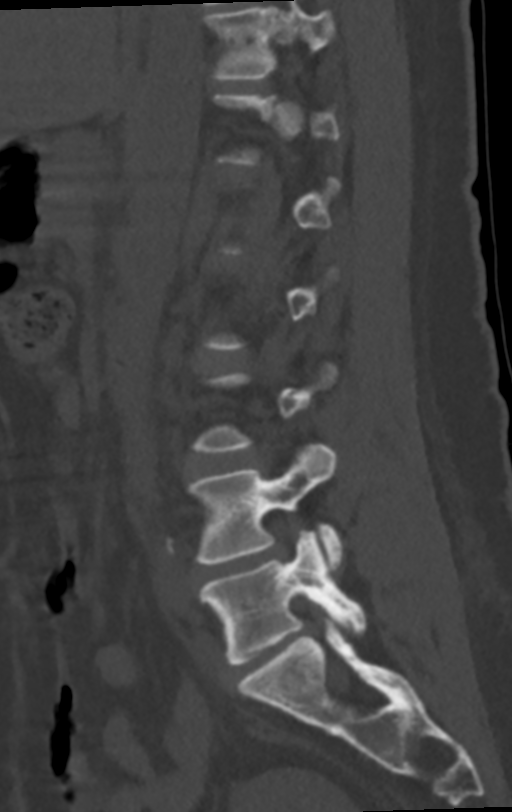

[13 of 33 positions shown; findings below may reference images not displayed]

FINDINGS: Segmentation: 5 lumbar type vertebrae.

Alignment: Stable.

Vertebrae: Stable vertebral body heights. No focal sclerotic or
destructive osseous lesion.

Paraspinal and other soft tissues: Unremarkable.

Disc levels: L1-L2 to L3-L4 levels are unremarkable.

L4-L5: Disc bulge. Ligamentum flavum thickening. No significant
canal or foraminal stenosis.

L5-S1: Probable postoperative changes of the right lamina. Disc
bulge. No canal stenosis. There is right ventral epidural soft
tissue with effacement of fat in the right subarticular recess. Mild
foraminal stenosis.
IMPRESSION: Probable postoperative changes at L5-S1. There is right ventral
epidural soft tissue with effacement of fat in the right
subarticular recess. May reflect residual/recurrent disc herniation
or postoperative change. Correlate for right S1 radiculopathy.

## 2021-08-26 IMAGING — MR MR LUMBAR SPINE W/O CM
4 of 5 series · 19 of 48 positions shown · non-contrast
Comparison: CT same day.  MRI [DATE].

CLINICAL DATA: Low back pain. Cauda equina syndrome suspected. Pain
radiates to the right lower extremity. Back surgery most recently
[DATE].

EXAM:
MRI LUMBAR SPINE WITHOUT CONTRAST
TECHNIQUE: Multiplanar, multisequence MR imaging of the lumbar spine was
performed. No intravenous contrast was administered.

[Series 3: T2 · sagittal · 4.0mm · 0.55mm/px · 6 of 14 slices shown (1 of 2)]
[im 1/14]
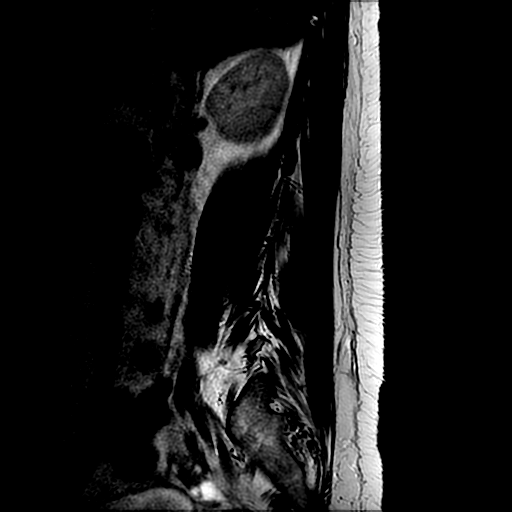
[im 3/14]
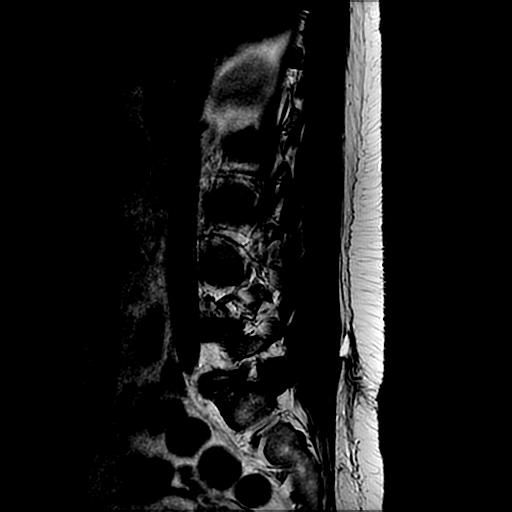
[im 6/14]
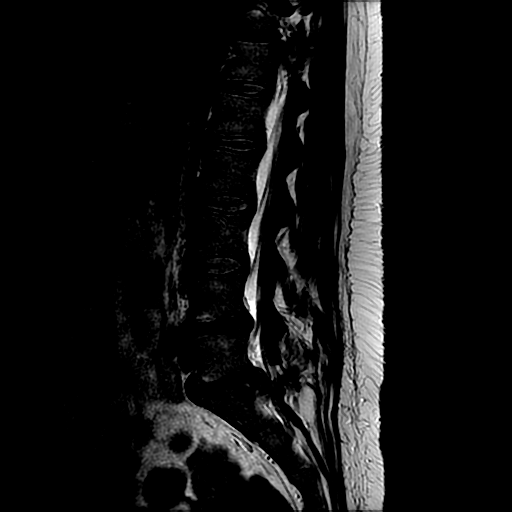
[im 8/14]
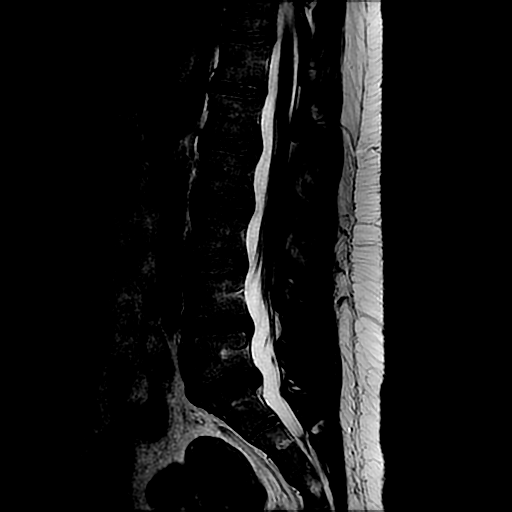
[im 11/14]
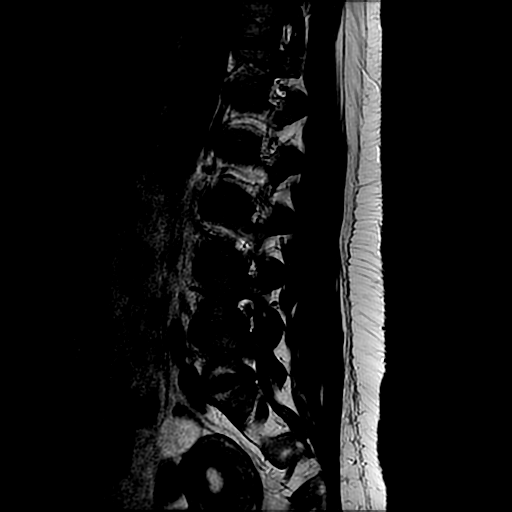
[im 14/14]
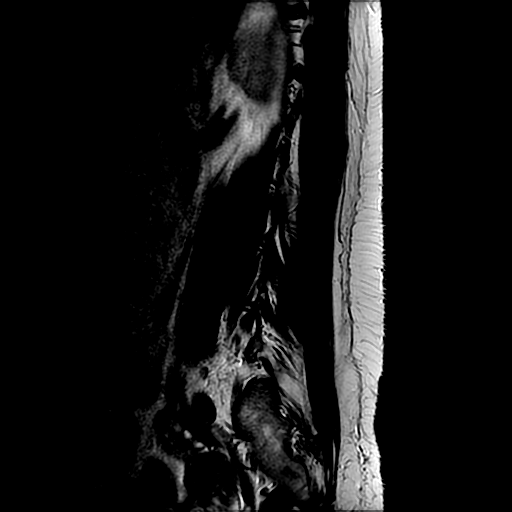

[Series 5: T1 · sagittal · 4.0mm · 0.55mm/px · 3 of 14 slices shown (1 of 2)]
[im 3/14]
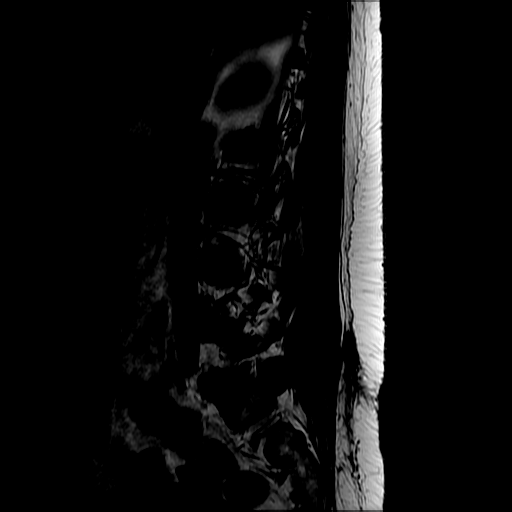
[im 8/14]
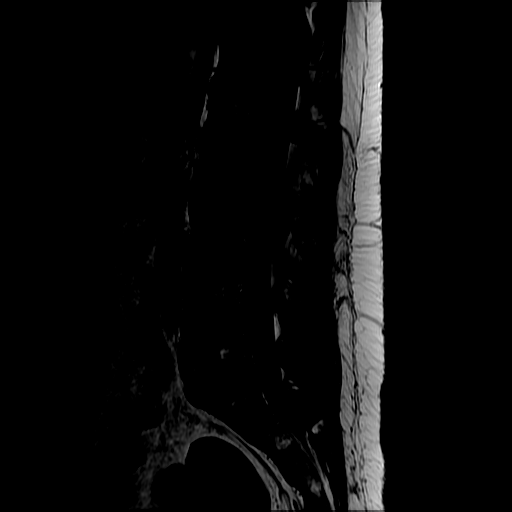
[im 14/14]
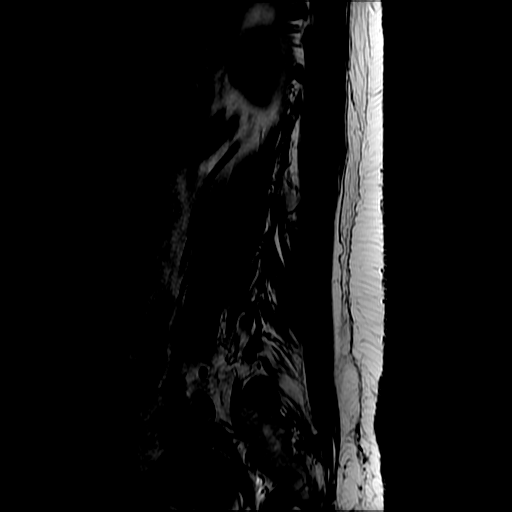

[Series 6: T2 · axial · 4.0mm · 0.39mm/px · z∈[-125,+54]mm · 7 of 37 slices shown (2 of 2)]
[im 1/37]
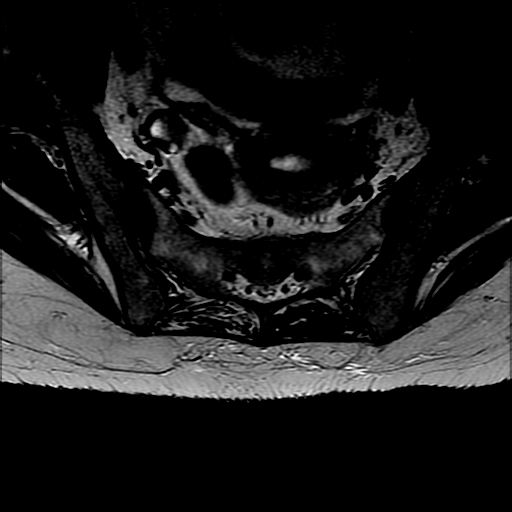
[im 6/37]
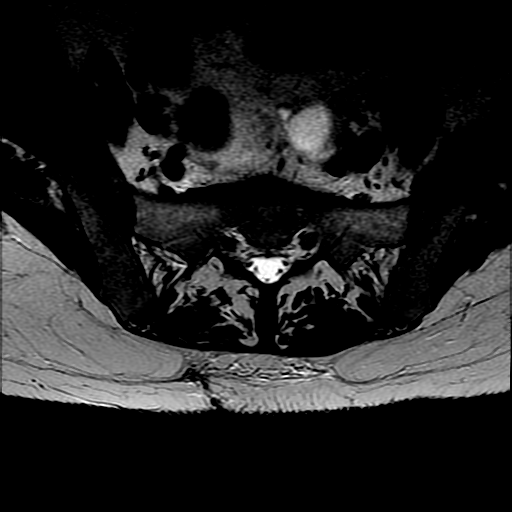
[im 11/37]
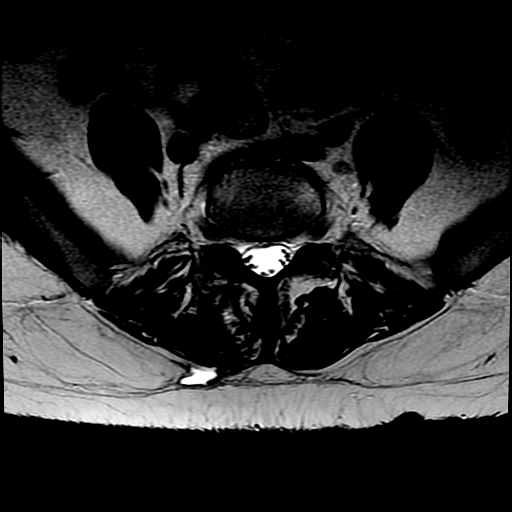
[im 16/37]
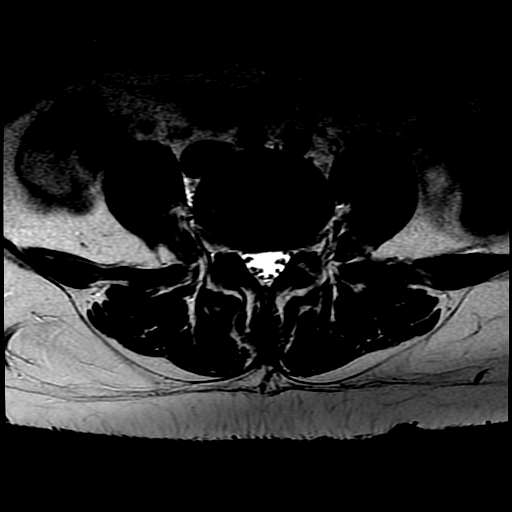
[im 19/37]
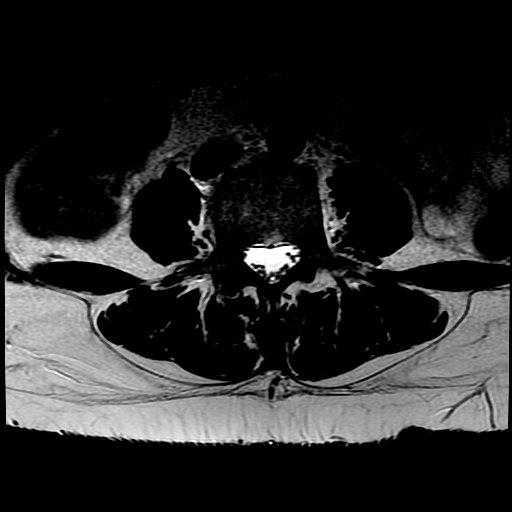
[im 21/37]
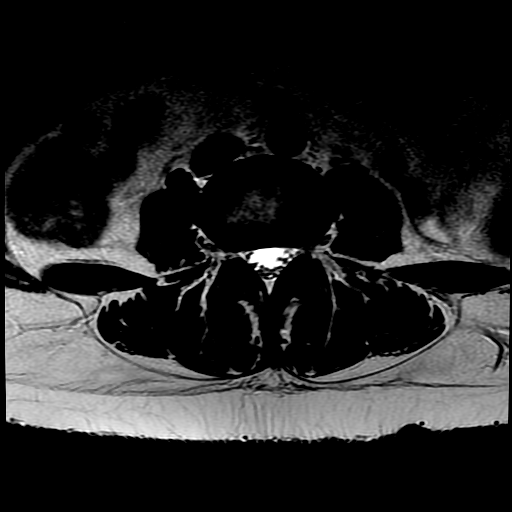
[im 31/37]
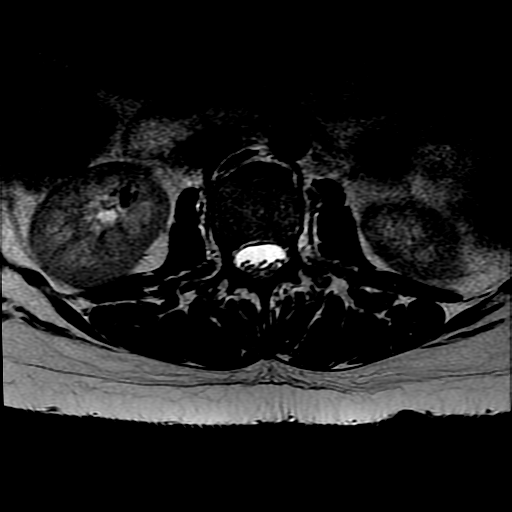

[Series 7: T1 · axial · 4.0mm · 0.39mm/px · z∈[-101,+54]mm · 3 of 37 slices shown (2 of 2)]
[im 6/37]
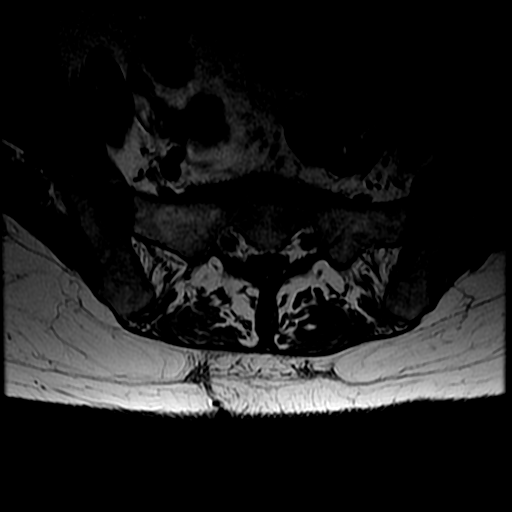
[im 19/37]
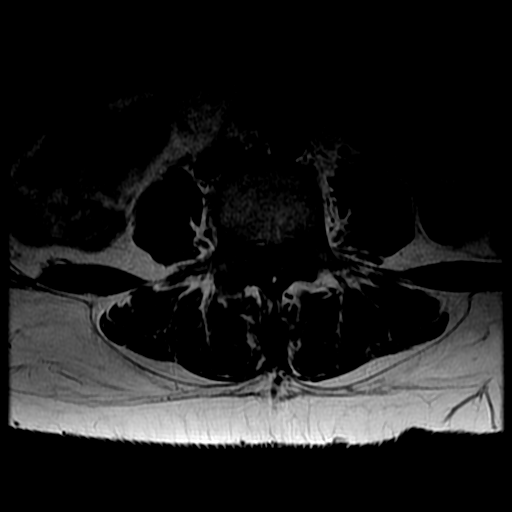
[im 31/37]
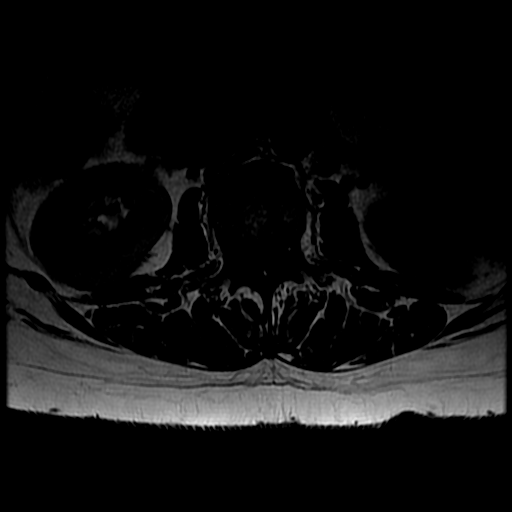

[19 of 48 positions shown; findings below may reference images not displayed]

FINDINGS: Segmentation: 5 lumbar type vertebral bodies as numbered previously.

Alignment:  No malalignment.

Vertebrae: No evidence of fracture. Mild discogenic endplate edema
at L5-S1 could be associated with back pain.

Conus medullaris and cauda equina: Conus extends to the L1-2 level.
Conus and cauda equina appear normal.

Paraspinal and other soft tissues: Negative

Disc levels:

No abnormality at L3-4 or above.

L4-5: Desiccation and bulging of the disc. Mild narrowing of the
lateral recesses but no neural compression or change since the
previous study.

L5-S1: Previous right hemilaminectomy and discectomy. Recurrent
right posterolateral disc herniation with a 1 cm fragment in the
right lateral recess showing caudal down turning. Right S1 nerve
compression would be likely.
IMPRESSION: Recurrent right posterolateral disc herniation at L5-S1 with a 1 cm
fragment likely compressing the right S1 nerve.

L4-5 disc bulge without apparent compressive stenosis, unchanged.

## 2021-08-26 MED ORDER — OXYCODONE-ACETAMINOPHEN 5-325 MG PO TABS
1.0000 | ORAL_TABLET | Freq: Once | ORAL | Status: AC
  Administered 2021-08-26: 1 via ORAL
  Filled 2021-08-26: qty 1

## 2021-08-26 MED ORDER — HYDROMORPHONE HCL 1 MG/ML IJ SOLN: 0.5000 mg | Freq: Once | INTRAMUSCULAR | Status: DC

## 2021-08-26 MED ORDER — METHOCARBAMOL 500 MG PO TABS
750.0000 mg | ORAL_TABLET | Freq: Once | ORAL | Status: AC
  Administered 2021-08-26: 750 mg via ORAL
  Filled 2021-08-26: qty 2

## 2021-08-26 MED ORDER — HYDROMORPHONE HCL 1 MG/ML IJ SOLN
0.5000 mg | Freq: Once | INTRAMUSCULAR | Status: AC
  Administered 2021-08-26: 0.5 mg via INTRAMUSCULAR
  Filled 2021-08-26: qty 1

## 2021-08-26 NOTE — ED Provider Notes (Signed)
Emergency Medicine Provider Triage Evaluation Note  [name] [name] , a 40 y.o. female  was evaluated in triage.  Pt complains of low back pain that radiates down her right lower extremity.  Patient has had 2 low back surgeries this year.  Last surgery was on 11/15 by Dr. Zada Finders. Last saw him on 12/8 where she was restarted on gabapentin. She notes directly before thanksgiving, she was given steroids. She endorses severe low back pain associated with subjective RLE weakness. She admits to urinary incontinence due to inability to make it to the bathroom in time due to pain. No saddle paraesthesias. Denies fever and chills.   Review of Systems  Positive: Low back pain Negative: fever  Physical Exam  BP 131/86   Pulse 88   Temp 97.6 F (36.4 C) (Oral)   Resp 16   LMP 08/13/2021   SpO2 100%  Gen:   Awake, no distress   Resp:  Normal effort  MSK:   Moves extremities without difficulty  Other:  Well appearing incision site. Patient ambulating in room without difficulty. No apparent weakness on exam. Bilateral lower extremities neurovascularly intact  Medical Decision Making  Medically screening exam initiated at 10:03 AM.  Appropriate orders placed.  Meggen Spaziani Hoffpauir-Propps was informed that the remainder of the evaluation will be completed by another provider, this initial triage assessment does not replace that evaluation, and the importance of remaining in the ED until their evaluation is complete.  CT lumbar spine Routine labs   Karie Kirks 08/26/21 1007    Milton Ferguson, MD 08/26/21 [phone]

## 2021-08-26 NOTE — ED Triage Notes (Signed)
Pt. Stated, ive had back pain from surgery Nov. 15 for herniated disc. It was starteed getting awful before thanksgiving and given steroids and this weekend really bad again.L5-S!

## 2021-08-26 NOTE — H&P (Signed)
Neurosurgery H&P  CC: RLE pain / weakness  HPI: This is a 40 y.o. woman that presents with 1 week of worsening right leg pain with new weakness. She previously had microdiscectomy x2, was having pain and an MRI was scheduled but she had new weakness and knew to come to the ED. No change in bowel / bladder control, no left sided symptoms. No recent use of anti-platelet or anti-coagulant medications.   ROS: A 14 point ROS was performed and is negative except as noted in the HPI.   PMHx:  Past Medical History:  Diagnosis Date   Boil 11/20/2014   Breast mass, right 07/2014   Colitis    no current problems   Fibromyalgia    GERD (gastroesophageal reflux disease)    Heart murmur    states no known problem   Sebaceous cyst 11/20/2014   FamHx:  Family History  Problem Relation Age of Onset   COPD Father    Diabetes Father    Heart disease Father    Diverticulosis Father    Diabetes Sister    Leukemia Sister    Diabetes Other    Cancer Other    Thyroid disease Other    Other Mother        shingles   SocHx:  reports that she quit smoking about 13 years ago. Her smoking use included cigarettes. She has never used smokeless tobacco. She reports that she does not drink alcohol and does not use drugs.  Exam: Vital signs in last 24 hours: Temp:  [97.6 F (36.4 C)] 97.6 F (36.4 C) (12/12 0938) Pulse Rate:  [66-91] 74 (12/12 2230) Resp:  [16-20] 16 (12/12 2230) BP: (107-148)/(71-88) 117/73 (12/12 2230) SpO2:  [99 %-100 %] 99 % (12/12 2230) General: Awake, alert, cooperative, lying in bed in NAD Head: Normocephalic and atruamatic HEENT: Neck supple Pulmonary: breathing room air comfortably, no evidence of increased work of breathing Cardiac: RRR Abdomen: S NT ND Extremities: Warm and well perfused x4 Neuro: AOx3, PERRL, EOMI, FS Strength 5/5 except right dorsiflexion / EHL / TA 4/5, SILTx4 except right L5 and S1 numbness   Assessment and Plan: 40 y.o. woman w/ prior 5-1 MIS  discectomy x2, now with recurrent symptoms and right foot weakness. MRI L-spine personally reviewed, which shows recurrent R 5-1 disc hernation.   -admit to my service -NPO after midnight -possible OR tomorrow for TLIF if possible, otherwise will be on 12/14  Judith Part, MD 08/26/21 11:40 PM [facility]

## 2021-08-26 NOTE — ED Provider Notes (Signed)
[facility] EMERGENCY DEPARTMENT Provider Note   CSN: [id number] Arrival date & time: 08/26/21  5462     History Chief Complaint  Patient presents with   Back Pain    [name] [name] is a 40 y.o. female.  The history is provided by the patient and medical records.  Back Pain Location:  Lumbar spine Quality:  Aching and shooting Radiates to:  R foot Pain severity:  Severe Pain is:  Same all the time Duration:  1 week Timing:  Constant Progression:  Unchanged Chronicity:  New Context comment:  Recent lumbar surgery 11/15 Relieved by:  Nothing Worsened by:  Bending and movement Ineffective treatments: gabapentin, steroids. Associated symptoms: no abdominal pain, no chest pain, no dysuria and no fever       Past Medical History:  Diagnosis Date   Boil 11/20/2014   Breast mass, right 07/2014   Colitis    no current problems   Fibromyalgia    GERD (gastroesophageal reflux disease)    Heart murmur    states no known problem   Sebaceous cyst 11/20/2014    Patient Active Problem List   Diagnosis Date Noted   Back pain 08/26/2021   Boil 11/20/2014   Sebaceous cyst 11/20/2014   Migraine headache with aura 09/19/2013   Consultation for sterilization 06/01/2013   Abnormal uterine bleeding (AUB) 05/11/2013   BV (bacterial vaginosis) 05/11/2013    Past Surgical History:  Procedure Laterality Date   COLPOSCOPY     x 2   LAPAROSCOPIC TUBAL LIGATION Bilateral 06/15/2013   Procedure: LAPAROSCOPIC BILATERAL TUBAL LIGATION WITH CAUTERY;  Surgeon: Florian Buff, MD;  Location: [facility];  Service: Gynecology;  Laterality: Bilateral;   RADIOACTIVE SEED GUIDED EXCISIONAL BREAST BIOPSY Right 08/03/2014   Procedure: RIGHT BREAST SEED GUIDED EXCISION;  Surgeon: Rolm Bookbinder, MD;  Location: [facility];  Service: General;  Laterality: Right;   TUBAL LIGATION     WISDOM TOOTH EXTRACTION       OB History     Gravida  1   Para  1    Term  1   Preterm      AB      Living  1      SAB      IAB      Ectopic      Multiple      Live Births              Family History  Problem Relation Age of Onset   COPD Father    Diabetes Father    Heart disease Father    Diverticulosis Father    Diabetes Sister    Leukemia Sister    Diabetes Other    Cancer Other    Thyroid disease Other    Other Mother        shingles    Social History   Tobacco Use   Smoking status: Former    Packs/day: 0.00    Years: 0.00    Pack years: 0.00    Types: Cigarettes    Quit date: 12/02/2007    Years since quitting: 13.7   Smokeless tobacco: Never  Vaping Use   Vaping Use: Never used  Substance Use Topics   Alcohol use: No   Drug use: No    Home Medications Prior to Admission medications   Medication Sig Start Date End Date Taking? Authorizing Provider  Aspirin-Acetaminophen-Caffeine (GOODYS EXTRA STRENGTH PO) Take 1 Package by mouth daily  as needed (headache).   Yes [provider]  diclofenac (VOLTAREN) 75 MG EC tablet Take 1 tablet (75 mg total) by mouth 2 (two) times daily as needed. Patient taking differently: Take 75 mg by mouth 2 (two) times daily as needed for mild pain. 11/02/20  Yes Aundra Dubin, PA-C  EPINEPHrine 0.3 mg/0.3 mL IJ SOAJ injection Inject 0.3 mg into the muscle as needed for anaphylaxis.   Yes [provider]  gabapentin (NEURONTIN) 300 MG capsule Take 300 mg by mouth at bedtime. 08/22/21  Yes [provider]  ibuprofen (ADVIL,MOTRIN) 200 MG tablet Take 200 mg by mouth every 6 (six) hours as needed for headache or moderate pain.   Yes [provider]  ondansetron (ZOFRAN ODT) 4 MG disintegrating tablet Take 1 tablet (4 mg total) by mouth every 6 (six) hours as needed for nausea or vomiting. 02/13/19  Yes Ward, Delice Bison, DO  oxyCODONE-acetaminophen (PERCOCET/ROXICET) 5-325 MG tablet Take 2 tablets by mouth every 6 (six) hours as needed for severe  pain. Patient taking differently: Take 0.5-1 tablets by mouth every 4 (four) hours as needed for severe pain. 02/13/19  Yes Ward, Kristen N, DO  temazepam (RESTORIL) 15 MG capsule Take 15 mg by mouth at bedtime as needed for sleep. 04/11/21  Yes [provider]  HYDROcodone-acetaminophen (NORCO) 5-325 MG tablet Take 1 tablet by mouth 2 (two) times daily as needed. Patient not taking: Reported on 08/26/2021 11/28/20   Aundra Dubin, PA-C  Methyl Salicylate-Lido-Menthol 4-4-5 % PTCH Apply 1 patch topically in the morning and at bedtime. Patient not taking: Reported on 08/26/2021 11/25/20   Carmin Muskrat, MD  methylPREDNISolone (MEDROL DOSEPAK) 4 MG TBPK tablet Take 4 mg by mouth See admin instructions. 6,5,4,3,2,1 Patient not taking: Reported on 08/26/2021    [provider]    Allergies    Cortisone, Cefdinir, Flexeril [cyclobenzaprine], and Septra ds [sulfamethoxazole-trimethoprim]  Review of Systems   Review of Systems  Constitutional:  Negative for chills and fever.  HENT:  Negative for ear pain and sore throat.   Eyes:  Negative for pain and visual disturbance.  Respiratory:  Negative for cough and shortness of breath.   Cardiovascular:  Negative for chest pain and palpitations.  Gastrointestinal:  Negative for abdominal pain and vomiting.  Genitourinary:  Negative for dysuria and hematuria.  Musculoskeletal:  Positive for back pain. Negative for arthralgias.  Skin:  Negative for color change and rash.  Neurological:  Negative for seizures and syncope.  All other systems reviewed and are negative.  Physical Exam Updated Vital Signs BP 117/73   Pulse 74   Temp 97.6 F (36.4 C) (Oral)   Resp 16   LMP 08/13/2021   SpO2 99%   Physical Exam Vitals and nursing note reviewed.  Constitutional:      General: She is not in acute distress.    Appearance: Normal appearance. She is well-developed.  HENT:     Head: Normocephalic and atraumatic.     Right Ear:  External ear normal.     Left Ear: External ear normal.     Nose: Nose normal. No congestion or rhinorrhea.     Mouth/Throat:     Mouth: Mucous membranes are moist.  Eyes:     Extraocular Movements: Extraocular movements intact.     Conjunctiva/sclera: Conjunctivae normal.     Pupils: Pupils are equal, round, and reactive to light.  Cardiovascular:     Rate and Rhythm: Normal rate and regular rhythm.  Pulses: Normal pulses.     Heart sounds: No murmur heard. Pulmonary:     Effort: Pulmonary effort is normal. No respiratory distress.     Breath sounds: Normal breath sounds. No wheezing, rhonchi or rales.  Abdominal:     General: Abdomen is flat. Bowel sounds are normal.     Palpations: Abdomen is soft.     Tenderness: There is no abdominal tenderness. There is no guarding or rebound.  Musculoskeletal:        General: No swelling, tenderness or deformity.     Cervical back: Normal range of motion and neck supple. No rigidity.  Skin:    General: Skin is warm and dry.     Capillary Refill: Capillary refill takes less than 2 seconds.  Neurological:     Mental Status: She is alert and oriented to person, place, and time.     Cranial Nerves: No cranial nerve deficit.     Sensory: Sensory deficit present.     Motor: Weakness (3-5 strength in right lower extremity compared to 5-5 strength on left.  Subjective paresthesias involving the right lower extremity.  2+ DP and PT pulses.) present.  Psychiatric:        Mood and Affect: Mood normal.    ED Results / Procedures / Treatments   Labs (all labs ordered are listed, but only abnormal results are displayed) Labs Reviewed  BASIC METABOLIC PANEL - Abnormal; Notable for the following components:      Result Value   Glucose, Bld 111 (*)    All other components within normal limits  RESP PANEL BY RT-PCR (FLU A&B, COVID) ARPGX2  CBC WITH DIFFERENTIAL/PLATELET  I-STAT BETA HCG BLOOD, ED ([facility], [facility], [facility] ONLY)    EKG None  Radiology CT  Lumbar Spine Wo Contrast  Result Date: 08/26/2021 CLINICAL DATA:  Low back pain, symptoms persist with > 6 wks treatment EXAM: CT LUMBAR SPINE WITHOUT CONTRAST TECHNIQUE: Multidetector CT imaging of the lumbar spine was performed without intravenous contrast administration. Multiplanar CT image reconstructions were also generated. COMPARISON:  Correlation made with MRI from March 2022 FINDINGS: Segmentation: 5 lumbar type vertebrae. Alignment: Stable. Vertebrae: Stable vertebral body heights. No focal sclerotic or destructive osseous lesion. Paraspinal and other soft tissues: Unremarkable. Disc levels: L1-L2 to L3-L4 levels are unremarkable. L4-L5: Disc bulge. Ligamentum flavum thickening. No significant canal or foraminal stenosis. L5-S1: Probable postoperative changes of the right lamina. Disc bulge. No canal stenosis. There is right ventral epidural soft tissue with effacement of fat in the right subarticular recess. Mild foraminal stenosis. IMPRESSION: Probable postoperative changes at L5-S1. There is right ventral epidural soft tissue with effacement of fat in the right subarticular recess. May reflect residual/recurrent disc herniation or postoperative change. Correlate for right S1 radiculopathy. Electronically Signed   By: Macy Mis M.D.   On: 08/26/2021 12:16   MR LUMBAR SPINE WO CONTRAST  Result Date: 08/26/2021 CLINICAL DATA:  Low back pain. Cauda equina syndrome suspected. Pain radiates to the right lower extremity. Back surgery most recently 07/30/2021. EXAM: MRI LUMBAR SPINE WITHOUT CONTRAST TECHNIQUE: Multiplanar, multisequence MR imaging of the lumbar spine was performed. No intravenous contrast was administered. COMPARISON:  CT same day.  MRI 11/26/2010. FINDINGS: Segmentation: 5 lumbar type vertebral bodies as numbered previously. Alignment:  No malalignment. Vertebrae: No evidence of fracture. Mild discogenic endplate edema at J5-K0 could be associated with back pain. Conus medullaris  and cauda equina: Conus extends to the L1-2 level. Conus and cauda equina appear normal.  Paraspinal and other soft tissues: Negative Disc levels: No abnormality at L3-4 or above. L4-5: Desiccation and bulging of the disc. Mild narrowing of the lateral recesses but no neural compression or change since the previous study. L5-S1: Previous right hemilaminectomy and discectomy. Recurrent right posterolateral disc herniation with a 1 cm fragment in the right lateral recess showing caudal down turning. Right S1 nerve compression would be likely. IMPRESSION: Recurrent right posterolateral disc herniation at L5-S1 with a 1 cm fragment likely compressing the right S1 nerve. L4-5 disc bulge without apparent compressive stenosis, unchanged. Electronically Signed   By: Nelson Chimes M.D.   On: 08/26/2021 18:52    Procedures Procedures   Medications Ordered in ED Medications  methocarbamol (ROBAXIN) tablet 750 mg (750 mg Oral Given 08/26/21 1913)  HYDROmorphone (DILAUDID) injection 0.5 mg (0.5 mg Intramuscular Given 08/26/21 1914)  oxyCODONE-acetaminophen (PERCOCET/ROXICET) 5-325 MG per tablet 1 tablet (1 tablet Oral Given 08/26/21 2243)    ED Course  I have reviewed the triage vital signs and the nursing notes.  Pertinent labs & imaging results that were available during my care of the patient were reviewed by me and considered in my medical decision making (see chart for details).    MDM Rules/Calculators/A&P                          40 year old female with history of with recent lower back surgery on 07/30/2021 by Dr. Venetia Constable for herniated disc.  She presents to the ED today with worsening lower back pain and radicular pain down her right lower extremity.  She does note an initial twisting injury in which she felt a pop in her back about a week ago.  She also reports an episode of urinary incontinence.  CT L-spine did show likely recurrent disc herniation at L5-S1.  Her white count is normal.  She is  not febrile or tachycardic.  Low concern for epidural abscess.  She is not on any anticoagulation.  Lower concern for epidural hematoma.  Due to patient's radicular symptoms and concern for spinal cord compression, MRI of the L-spine obtained.  No evidence of spinal cord compression.  Patient given IM analgesia and muscle relaxants.  Neurosurgery team consulted.  Recommended admission for possible surgical intervention with discectomy.  Patient agreeable to plan.   Final Clinical Impression(s) / ED Diagnoses Final diagnoses:  Acute right-sided low back pain with right-sided sciatica    Rx / DC Orders ED Discharge Orders     None        Idamae Lusher, MD 08/26/21 2341    Margette Fast, MD 08/29/21 1555

## 2021-08-27 ENCOUNTER — Other Ambulatory Visit: Payer: Self-pay

## 2021-08-27 ENCOUNTER — Encounter (HOSPITAL_COMMUNITY): Payer: Self-pay | Admitting: Neurological Surgery

## 2021-08-27 ENCOUNTER — Other Ambulatory Visit: Payer: Self-pay | Admitting: Neurological Surgery

## 2021-08-27 DIAGNOSIS — M5416 Radiculopathy, lumbar region: Secondary | ICD-10-CM | POA: Diagnosis present

## 2021-08-27 LAB — TYPE AND SCREEN
ABO/RH(D): A POS
Antibody Screen: NEGATIVE

## 2021-08-27 LAB — RESP PANEL BY RT-PCR (FLU A&B, COVID) ARPGX2
Influenza A by PCR: NEGATIVE
Influenza B by PCR: NEGATIVE
SARS Coronavirus 2 by RT PCR: NEGATIVE

## 2021-08-27 LAB — MRSA NEXT GEN BY PCR, NASAL: MRSA by PCR Next Gen: NOT DETECTED

## 2021-08-27 MED ORDER — MENTHOL 3 MG MT LOZG
1.0000 | LOZENGE | OROMUCOSAL | Status: DC | PRN
  Filled 2021-08-27: qty 9

## 2021-08-27 MED ORDER — OXYCODONE HCL 5 MG PO TABS
10.0000 mg | ORAL_TABLET | ORAL | Status: DC | PRN
  Administered 2021-08-27 – 2021-08-29 (×2): 10 mg via ORAL
  Filled 2021-08-27 (×2): qty 2

## 2021-08-27 MED ORDER — HYDROMORPHONE HCL 1 MG/ML IJ SOLN
1.0000 mg | INTRAMUSCULAR | Status: DC | PRN
  Administered 2021-08-27 – 2021-08-30 (×12): 1 mg via INTRAVENOUS
  Filled 2021-08-27 (×13): qty 1

## 2021-08-27 MED ORDER — OXYCODONE HCL 5 MG PO TABS: 5.0000 mg | ORAL_TABLET | ORAL | Status: DC | PRN

## 2021-08-27 MED ORDER — SODIUM CHLORIDE 0.9 % IV SOLN: INTRAVENOUS | Status: DC

## 2021-08-27 MED ORDER — POLYETHYLENE GLYCOL 3350 17 G PO PACK: 17.0000 g | PACK | Freq: Every day | ORAL | Status: DC | PRN

## 2021-08-27 MED ORDER — ACETAMINOPHEN 650 MG RE SUPP: 650.0000 mg | RECTAL | Status: DC | PRN

## 2021-08-27 MED ORDER — SODIUM CHLORIDE 0.9% FLUSH
3.0000 mL | Freq: Two times a day (BID) | INTRAVENOUS | Status: DC
  Administered 2021-08-27 – 2021-08-30 (×5): 3 mL via INTRAVENOUS

## 2021-08-27 MED ORDER — DOCUSATE SODIUM 100 MG PO CAPS
100.0000 mg | ORAL_CAPSULE | Freq: Two times a day (BID) | ORAL | Status: DC
  Administered 2021-08-27 – 2021-08-30 (×5): 100 mg via ORAL
  Filled 2021-08-27 (×5): qty 1

## 2021-08-27 MED ORDER — METHOCARBAMOL 1000 MG/10ML IJ SOLN
500.0000 mg | Freq: Four times a day (QID) | INTRAVENOUS | Status: DC | PRN
  Administered 2021-08-28 – 2021-08-30 (×2): 500 mg via INTRAVENOUS
  Filled 2021-08-27: qty 5
  Filled 2021-08-27: qty 500
  Filled 2021-08-27: qty 5

## 2021-08-27 MED ORDER — SODIUM CHLORIDE 0.9% FLUSH: 3.0000 mL | INTRAVENOUS | Status: DC | PRN

## 2021-08-27 MED ORDER — ONDANSETRON HCL 4 MG PO TABS: 4.0000 mg | ORAL_TABLET | Freq: Four times a day (QID) | ORAL | Status: DC | PRN

## 2021-08-27 MED ORDER — CHLORHEXIDINE GLUCONATE CLOTH 2 % EX PADS: 6.0000 | MEDICATED_PAD | Freq: Once | CUTANEOUS | Status: DC

## 2021-08-27 MED ORDER — CHLORHEXIDINE GLUCONATE CLOTH 2 % EX PADS
6.0000 | MEDICATED_PAD | Freq: Once | CUTANEOUS | Status: AC
  Administered 2021-08-28: 10:00:00 6 via TOPICAL

## 2021-08-27 MED ORDER — SODIUM CHLORIDE 0.9 % IV SOLN: 250.0000 mL | INTRAVENOUS | Status: DC

## 2021-08-27 MED ORDER — PHENOL 1.4 % MT LIQD
1.0000 | OROMUCOSAL | Status: DC | PRN
  Filled 2021-08-27: qty 177

## 2021-08-27 MED ORDER — ONDANSETRON HCL 4 MG/2ML IJ SOLN
4.0000 mg | Freq: Four times a day (QID) | INTRAMUSCULAR | Status: DC | PRN
  Administered 2021-08-27 – 2021-08-28 (×3): 4 mg via INTRAVENOUS
  Filled 2021-08-27 (×4): qty 2

## 2021-08-27 MED ORDER — ACETAMINOPHEN 325 MG PO TABS
650.0000 mg | ORAL_TABLET | ORAL | Status: DC | PRN
  Administered 2021-08-29: 650 mg via ORAL
  Filled 2021-08-27: qty 2

## 2021-08-27 MED ORDER — TEMAZEPAM 7.5 MG PO CAPS: 15.0000 mg | ORAL_CAPSULE | Freq: Every evening | ORAL | Status: DC | PRN

## 2021-08-27 MED ORDER — VANCOMYCIN HCL IN DEXTROSE 1-5 GM/200ML-% IV SOLN
1000.0000 mg | INTRAVENOUS | Status: AC
  Administered 2021-08-28: 12:00:00 1000 mg via INTRAVENOUS
  Filled 2021-08-27 (×2): qty 200

## 2021-08-27 MED ORDER — GABAPENTIN 300 MG PO CAPS
300.0000 mg | ORAL_CAPSULE | Freq: Every day | ORAL | Status: DC
  Administered 2021-08-27 – 2021-08-29 (×3): 300 mg via ORAL
  Filled 2021-08-27 (×3): qty 1

## 2021-08-27 NOTE — ED Notes (Signed)
RN ordered pt meal tray

## 2021-08-27 NOTE — ED Notes (Signed)
Patient is resting comfortably. 

## 2021-08-27 NOTE — ED Notes (Signed)
Pt ambulatory to bathroom

## 2021-08-27 NOTE — Progress Notes (Signed)
Neurosurgery Service Progress Note  Subjective: No acute events overnight, pain is still very severe, unchanged weakness, she's miserable   Objective: Vitals:   08/27/21 1607 08/27/21 1822 08/27/21 1855 08/27/21 2000  BP: 109/62 122/71 116/74   Pulse: 80 68 68   Resp: 18 16 17    Temp:   98.5 F (36.9 C)   TempSrc:   Oral   SpO2: 97% 99% 100%   Weight:    70.3 kg  Height:    5\' 2"  (1.575 m)    Physical Exam: Strength 5/5 except right dorsiflexion / EHL / TA 4/5, SILTx4 except right L5 and S1 numbness  Assessment & Plan: 40 y.o. woman admitted for recurrent disc herniation with foot weakness.   -OR tomorrow, NPO after midnight, can have meds with sips  Judith Part  08/27/21 9:16 PM

## 2021-08-28 ENCOUNTER — Inpatient Hospital Stay (HOSPITAL_COMMUNITY): Payer: 59 | Admitting: Anesthesiology

## 2021-08-28 ENCOUNTER — Encounter (HOSPITAL_COMMUNITY)
Admission: EM | Disposition: A | Discharge status: Home / Self Care | Payer: Self-pay | Source: Home / Self Care | Attending: Neurological Surgery

## 2021-08-28 ENCOUNTER — Inpatient Hospital Stay (HOSPITAL_COMMUNITY): Payer: 59

## 2021-08-28 ENCOUNTER — Encounter (HOSPITAL_COMMUNITY): Payer: Self-pay | Admitting: Neurological Surgery

## 2021-08-28 HISTORY — PX: TRANSFORAMINAL LUMBAR INTERBODY FUSION W/ MIS 1 LEVEL: SHX6145

## 2021-08-28 LAB — ABO/RH: ABO/RH(D): A POS

## 2021-08-28 IMAGING — RF DG LUMBAR SPINE 2-3V
1 series · 2 of 2 positions shown · non-contrast
Comparison: [DATE].

CLINICAL DATA: Surgical posterior fusion of L5-S1.

EXAM:
LUMBAR SPINE - 2-3 VIEW; DG C-ARM 1-60 MIN-NO REPORT
Radiation exposure index: 64.92 mGy.

[Series 1: run · 2 of 2 slices shown]
[im 1/2]
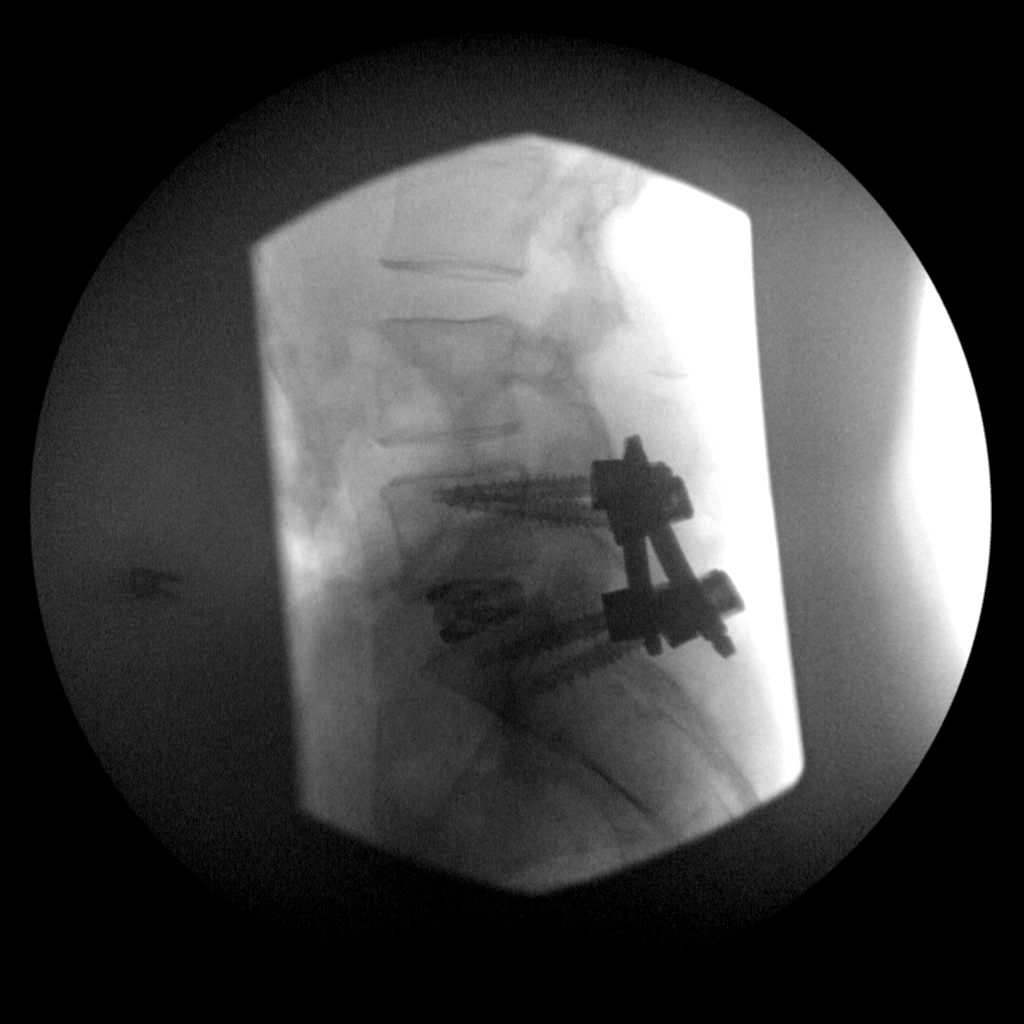
[im 2/2]
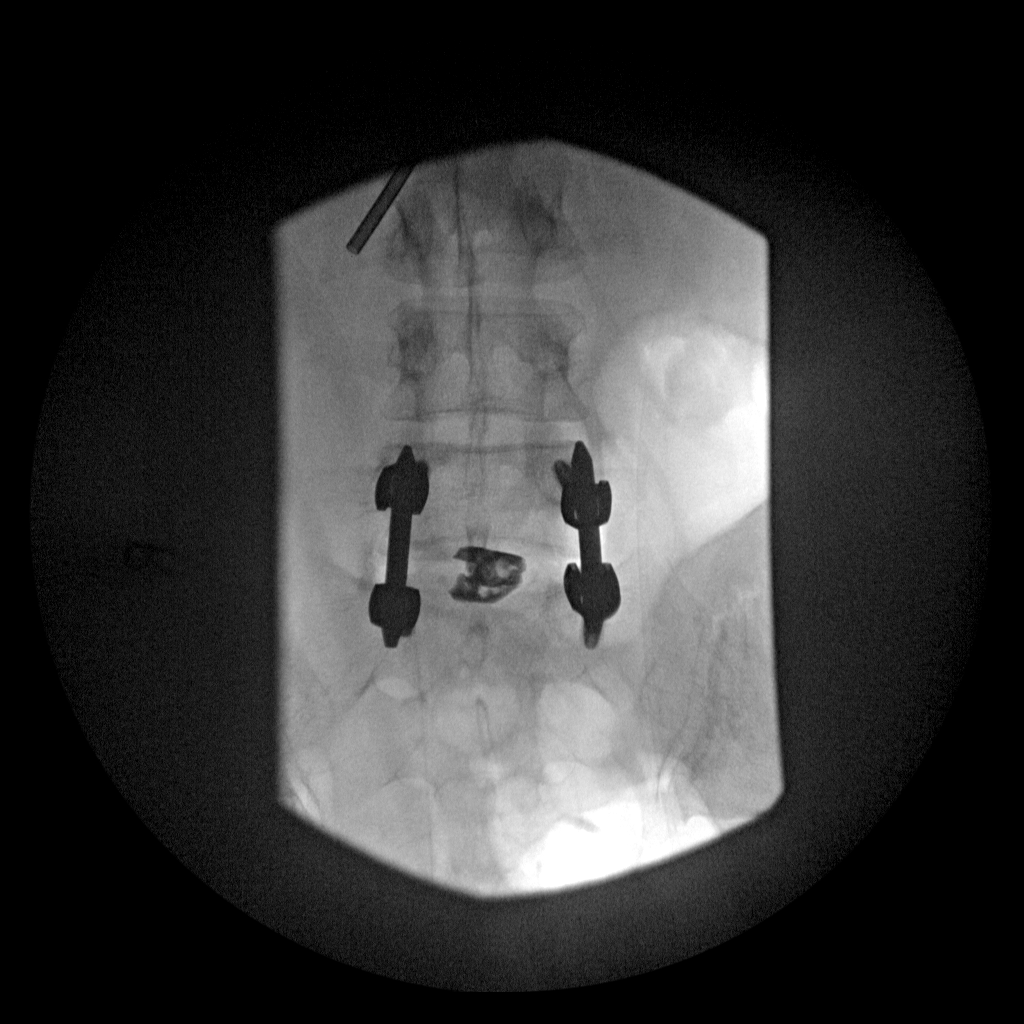

[2 of 2 positions shown; findings below may reference images not displayed]

FINDINGS: Two intraoperative fluoroscopic images were obtained of the lower
lumbar spine. These demonstrate surgical posterior fusion of L5-S1
with interbody fusion.
IMPRESSION: Fluoroscopic guidance provided during surgical posterior fusion of
L5-S1.

## 2021-08-28 SURGERY — MINIMALLY INVASIVE (MIS) TRANSFORAMINAL LUMBAR INTERBODY FUSION (TLIF) 1 LEVEL
Anesthesia: General | Site: Spine Lumbar | Laterality: Right

## 2021-08-28 MED ORDER — OXYCODONE HCL 5 MG PO TABS: 5.0000 mg | ORAL_TABLET | Freq: Once | ORAL | Status: DC | PRN

## 2021-08-28 MED ORDER — LIDOCAINE 2% (20 MG/ML) 5 ML SYRINGE
INTRAMUSCULAR | Status: DC | PRN
  Administered 2021-08-28: 40 mg via INTRAVENOUS

## 2021-08-28 MED ORDER — OXYCODONE HCL 5 MG/5ML PO SOLN: 5.0000 mg | Freq: Once | ORAL | Status: DC | PRN

## 2021-08-28 MED ORDER — KETAMINE HCL 10 MG/ML IJ SOLN
INTRAMUSCULAR | Status: DC | PRN
  Administered 2021-08-28: 10 mg via INTRAVENOUS
  Administered 2021-08-28 (×2): 20 mg via INTRAVENOUS

## 2021-08-28 MED ORDER — THROMBIN 5000 UNITS EX SOLR: OROMUCOSAL | Status: DC | PRN

## 2021-08-28 MED ORDER — HYDROMORPHONE HCL 1 MG/ML IJ SOLN
0.2500 mg | INTRAMUSCULAR | Status: DC | PRN
  Administered 2021-08-28 (×2): 0.5 mg via INTRAVENOUS

## 2021-08-28 MED ORDER — ROCURONIUM BROMIDE 10 MG/ML (PF) SYRINGE
PREFILLED_SYRINGE | INTRAVENOUS | Status: AC
  Filled 2021-08-28: qty 10

## 2021-08-28 MED ORDER — SCOPOLAMINE 1 MG/3DAYS TD PT72: 1.0000 | MEDICATED_PATCH | TRANSDERMAL | Status: DC

## 2021-08-28 MED ORDER — HYDROMORPHONE HCL 1 MG/ML IJ SOLN
INTRAMUSCULAR | Status: DC | PRN
  Administered 2021-08-28: 1 mg via INTRAVENOUS
  Administered 2021-08-28: .5 mg via INTRAVENOUS

## 2021-08-28 MED ORDER — 0.9 % SODIUM CHLORIDE (POUR BTL) OPTIME
TOPICAL | Status: DC | PRN
  Administered 2021-08-28: 13:00:00 1500 mL

## 2021-08-28 MED ORDER — CHLORHEXIDINE GLUCONATE 0.12 % MT SOLN
OROMUCOSAL | Status: AC
  Administered 2021-08-28: 12:00:00 15 mL
  Filled 2021-08-28: qty 15

## 2021-08-28 MED ORDER — MIDAZOLAM HCL 2 MG/2ML IJ SOLN
INTRAMUSCULAR | Status: AC
  Filled 2021-08-28: qty 2

## 2021-08-28 MED ORDER — LIDOCAINE-EPINEPHRINE 1 %-1:100000 IJ SOLN
INTRAMUSCULAR | Status: DC | PRN
  Administered 2021-08-28: 10 mL

## 2021-08-28 MED ORDER — PROMETHAZINE HCL 25 MG/ML IJ SOLN: 6.2500 mg | INTRAMUSCULAR | Status: DC | PRN

## 2021-08-28 MED ORDER — LIDOCAINE-EPINEPHRINE 1 %-1:100000 IJ SOLN
INTRAMUSCULAR | Status: AC
  Filled 2021-08-28: qty 1

## 2021-08-28 MED ORDER — MIDAZOLAM HCL 2 MG/2ML IJ SOLN: 0.5000 mg | Freq: Once | INTRAMUSCULAR | Status: DC | PRN

## 2021-08-28 MED ORDER — FENTANYL CITRATE (PF) 250 MCG/5ML IJ SOLN
INTRAMUSCULAR | Status: DC | PRN
  Administered 2021-08-28: 100 ug via INTRAVENOUS
  Administered 2021-08-28: 50 ug via INTRAVENOUS
  Administered 2021-08-28: 100 ug via INTRAVENOUS

## 2021-08-28 MED ORDER — FENTANYL CITRATE (PF) 250 MCG/5ML IJ SOLN
INTRAMUSCULAR | Status: AC
  Filled 2021-08-28: qty 5

## 2021-08-28 MED ORDER — HYDROMORPHONE HCL 1 MG/ML IJ SOLN
INTRAMUSCULAR | Status: AC
  Filled 2021-08-28: qty 1

## 2021-08-28 MED ORDER — PHENYLEPHRINE 40 MCG/ML (10ML) SYRINGE FOR IV PUSH (FOR BLOOD PRESSURE SUPPORT)
PREFILLED_SYRINGE | INTRAVENOUS | Status: AC
  Filled 2021-08-28: qty 10

## 2021-08-28 MED ORDER — ONDANSETRON HCL 4 MG/2ML IJ SOLN
INTRAMUSCULAR | Status: DC | PRN
  Administered 2021-08-28: 4 mg via INTRAVENOUS

## 2021-08-28 MED ORDER — LACTATED RINGERS IV SOLN: INTRAVENOUS | Status: DC | PRN

## 2021-08-28 MED ORDER — MIDAZOLAM HCL 2 MG/2ML IJ SOLN
INTRAMUSCULAR | Status: DC | PRN
  Administered 2021-08-28: 2 mg via INTRAVENOUS

## 2021-08-28 MED ORDER — ACETAMINOPHEN 500 MG PO TABS
1000.0000 mg | ORAL_TABLET | Freq: Once | ORAL | Status: AC
  Administered 2021-08-28: 12:00:00 1000 mg via ORAL
  Filled 2021-08-28: qty 2

## 2021-08-28 MED ORDER — SCOPOLAMINE 1 MG/3DAYS TD PT72
MEDICATED_PATCH | TRANSDERMAL | Status: AC
  Administered 2021-08-28: 12:00:00 1.5 mg via TRANSDERMAL
  Filled 2021-08-28: qty 1

## 2021-08-28 MED ORDER — MEPERIDINE HCL 25 MG/ML IJ SOLN: 6.2500 mg | INTRAMUSCULAR | Status: DC | PRN

## 2021-08-28 MED ORDER — HYDROMORPHONE HCL 1 MG/ML IJ SOLN
INTRAMUSCULAR | Status: AC
  Filled 2021-08-28: qty 0.5

## 2021-08-28 MED ORDER — DEXAMETHASONE SODIUM PHOSPHATE 10 MG/ML IJ SOLN
INTRAMUSCULAR | Status: DC | PRN
  Administered 2021-08-28: 10 mg via INTRAVENOUS

## 2021-08-28 MED ORDER — THROMBIN 5000 UNITS EX SOLR
CUTANEOUS | Status: AC
  Filled 2021-08-28: qty 5000

## 2021-08-28 MED ORDER — PHENYLEPHRINE HCL-NACL 20-0.9 MG/250ML-% IV SOLN
INTRAVENOUS | Status: DC | PRN
  Administered 2021-08-28: 20 ug/min via INTRAVENOUS

## 2021-08-28 MED ORDER — PROPOFOL 10 MG/ML IV BOLUS
INTRAVENOUS | Status: AC
  Filled 2021-08-28: qty 20

## 2021-08-28 MED ORDER — PHENYLEPHRINE 40 MCG/ML (10ML) SYRINGE FOR IV PUSH (FOR BLOOD PRESSURE SUPPORT)
PREFILLED_SYRINGE | INTRAVENOUS | Status: DC | PRN
  Administered 2021-08-28: 40 ug via INTRAVENOUS
  Administered 2021-08-28: 80 ug via INTRAVENOUS
  Administered 2021-08-28: 40 ug via INTRAVENOUS
  Administered 2021-08-28: 80 ug via INTRAVENOUS

## 2021-08-28 MED ORDER — SUGAMMADEX SODIUM 200 MG/2ML IV SOLN
INTRAVENOUS | Status: DC | PRN
  Administered 2021-08-28: 100 mg via INTRAVENOUS

## 2021-08-28 MED ORDER — KETAMINE HCL 50 MG/5ML IJ SOSY
PREFILLED_SYRINGE | INTRAMUSCULAR | Status: AC
  Filled 2021-08-28: qty 5

## 2021-08-28 MED ORDER — LIDOCAINE 2% (20 MG/ML) 5 ML SYRINGE
INTRAMUSCULAR | Status: AC
  Filled 2021-08-28: qty 5

## 2021-08-28 MED ORDER — DEXAMETHASONE SODIUM PHOSPHATE 10 MG/ML IJ SOLN
INTRAMUSCULAR | Status: AC
  Filled 2021-08-28: qty 1

## 2021-08-28 MED ORDER — SUCCINYLCHOLINE CHLORIDE 200 MG/10ML IV SOSY
PREFILLED_SYRINGE | INTRAVENOUS | Status: AC
  Filled 2021-08-28: qty 10

## 2021-08-28 MED ORDER — ONDANSETRON HCL 4 MG/2ML IJ SOLN
INTRAMUSCULAR | Status: AC
  Filled 2021-08-28: qty 2

## 2021-08-28 MED ORDER — PROPOFOL 10 MG/ML IV BOLUS
INTRAVENOUS | Status: DC | PRN
  Administered 2021-08-28: 150 mg via INTRAVENOUS

## 2021-08-28 MED ORDER — ROCURONIUM BROMIDE 10 MG/ML (PF) SYRINGE
PREFILLED_SYRINGE | INTRAVENOUS | Status: DC | PRN
  Administered 2021-08-28: 50 mg via INTRAVENOUS

## 2021-08-28 MED ORDER — HYDROMORPHONE HCL 1 MG/ML IJ SOLN
INTRAMUSCULAR | Status: AC
  Administered 2021-08-28: 20:00:00 1 mg via INTRAVENOUS
  Filled 2021-08-28: qty 1

## 2021-08-28 SURGICAL SUPPLY — 63 items
BAG COUNTER SPONGE SURGICOUNT (BAG) ×2 IMPLANT
BAND RUBBER #18 3X1/16 STRL (MISCELLANEOUS) ×4 IMPLANT
BASKET BONE COLLECTION (BASKET) ×2 IMPLANT
BLADE CLIPPER SURG (BLADE) IMPLANT
BLADE SURG 11 STRL SS (BLADE) ×2 IMPLANT
BUR MATCHSTICK NEURO 3.0 LAGG (BURR) IMPLANT
BUR PRECISION MATCH 3.0 13 (BURR) IMPLANT
BUR ROUND PRECISION 4.0 (BURR) ×2 IMPLANT
CAGE INTERBODY PL SHT 7X22.5 (Plate) ×1 IMPLANT
CNTNR URN SCR LID CUP LEK RST (MISCELLANEOUS) ×1 IMPLANT
CONT SPEC 4OZ STRL OR WHT (MISCELLANEOUS) ×2
COVER BACK TABLE 60X90IN (DRAPES) ×2 IMPLANT
DECANTER SPIKE VIAL GLASS SM (MISCELLANEOUS) ×2 IMPLANT
DERMABOND ADVANCED (GAUZE/BANDAGES/DRESSINGS) ×1
DERMABOND ADVANCED .7 DNX12 (GAUZE/BANDAGES/DRESSINGS) ×1 IMPLANT
DRAPE C-ARM 42X72 X-RAY (DRAPES) ×3 IMPLANT
DRAPE C-ARMOR (DRAPES) ×2 IMPLANT
DRAPE LAPAROTOMY 100X72X124 (DRAPES) ×2 IMPLANT
DRAPE MICROSCOPE LEICA (MISCELLANEOUS) ×2 IMPLANT
DRAPE SURG 17X23 STRL (DRAPES) ×4 IMPLANT
ELECT BLADE 6.5 EXT (BLADE) ×2 IMPLANT
ELECT REM PT RETURN 9FT ADLT (ELECTROSURGICAL) ×2
ELECTRODE REM PT RTRN 9FT ADLT (ELECTROSURGICAL) ×1 IMPLANT
EXTENDER TAB GUIDE SV 5.5/6.0 (INSTRUMENTS) ×8 IMPLANT
GAUZE 4X4 16PLY ~~LOC~~+RFID DBL (SPONGE) IMPLANT
GAUZE SPONGE 4X4 12PLY STRL (GAUZE/BANDAGES/DRESSINGS) ×2 IMPLANT
GLOVE SURG LTX SZ7.5 (GLOVE) ×2 IMPLANT
GLOVE SURG UNDER POLY LF SZ7.5 (GLOVE) ×2 IMPLANT
GOWN STRL REUS W/ TWL LRG LVL3 (GOWN DISPOSABLE) ×1 IMPLANT
GOWN STRL REUS W/ TWL XL LVL3 (GOWN DISPOSABLE) IMPLANT
GOWN STRL REUS W/TWL 2XL LVL3 (GOWN DISPOSABLE) IMPLANT
GOWN STRL REUS W/TWL LRG LVL3 (GOWN DISPOSABLE) ×2
GOWN STRL REUS W/TWL XL LVL3 (GOWN DISPOSABLE) ×2
GUIDEWIRE BLUNT NT 450 (WIRE) ×4 IMPLANT
HEMOSTAT POWDER KIT SURGIFOAM (HEMOSTASIS) ×2 IMPLANT
KIT BASIN OR (CUSTOM PROCEDURE TRAY) ×2 IMPLANT
KIT POSITION SURG JACKSON T1 (MISCELLANEOUS) ×2 IMPLANT
KIT TURNOVER KIT B (KITS) IMPLANT
NDL BEVEL TWO-PAK W/1PK (NEEDLE) IMPLANT
NDL HYPO 18GX1.5 BLUNT FILL (NEEDLE) IMPLANT
NDL SPNL 18GX3.5 QUINCKE PK (NEEDLE) IMPLANT
NEEDLE BEVEL TWO-PAK W/1PK (NEEDLE) ×2 IMPLANT
NEEDLE HYPO 18GX1.5 BLUNT FILL (NEEDLE) IMPLANT
NEEDLE HYPO 22GX1.5 SAFETY (NEEDLE) ×2 IMPLANT
NEEDLE SPNL 18GX3.5 QUINCKE PK (NEEDLE) IMPLANT
NS IRRIG 1000ML POUR BTL (IV SOLUTION) ×2 IMPLANT
PACK LAMINECTOMY NEURO (CUSTOM PROCEDURE TRAY) ×2 IMPLANT
PAD ARMBOARD 7.5X6 YLW CONV (MISCELLANEOUS) ×4 IMPLANT
PUTTY DBF 3CC CORTICAL FIBERS (Putty) ×2 IMPLANT
ROD 5.5 CCM PERC 40 (Rod) ×2 IMPLANT
SCREW MAS VOYAGER 6.5X30 (Screw) ×2 IMPLANT
SCREW MAS VOYAGER 6.5X35 (Screw) ×2 IMPLANT
SCREW SET 5.5/6.0MM SOLERA (Screw) ×4 IMPLANT
SPONGE T-LAP 4X18 ~~LOC~~+RFID (SPONGE) IMPLANT
SUT MNCRL AB 3-0 PS2 18 (SUTURE) ×2 IMPLANT
SUT VIC AB 0 CT1 18XCR BRD8 (SUTURE) IMPLANT
SUT VIC AB 0 CT1 8-18 (SUTURE)
SUT VIC AB 2-0 CP2 18 (SUTURE) ×3 IMPLANT
SYR 3ML LL SCALE MARK (SYRINGE) IMPLANT
TOWEL GREEN STERILE (TOWEL DISPOSABLE) ×2 IMPLANT
TOWEL GREEN STERILE FF (TOWEL DISPOSABLE) ×2 IMPLANT
TRAY FOLEY MTR SLVR 16FR STAT (SET/KITS/TRAYS/PACK) ×1 IMPLANT
WATER STERILE IRR 1000ML POUR (IV SOLUTION) ×2 IMPLANT

## 2021-08-28 NOTE — Plan of Care (Signed)
  Problem: Education: Goal: Knowledge of General Education information will improve Description: Including pain rating scale, medication(s)/side effects and non-pharmacologic comfort measures Outcome: Progressing   Problem: Clinical Measurements: Goal: Respiratory complications will improve Outcome: Progressing Goal: Cardiovascular complication will be avoided Outcome: Progressing   

## 2021-08-28 NOTE — Anesthesia Procedure Notes (Signed)
Procedure Name: Intubation Date/Time: 08/28/2021 12:32 PM Performed by: Renato Shin, CRNA Pre-anesthesia Checklist: Patient identified, Emergency Drugs available, Suction available and Patient being monitored Patient Re-evaluated:Patient Re-evaluated prior to induction Oxygen Delivery Method: Circle system utilized Preoxygenation: Pre-oxygenation with 100% oxygen Induction Type: IV induction Ventilation: Mask ventilation without difficulty Laryngoscope Size: Mac and 3 Grade View: Grade I Tube type: Oral Tube size: 7.0 mm Number of attempts: 1 Airway Equipment and Method: Stylet and Oral airway Placement Confirmation: ETT inserted through vocal cords under direct vision, positive ETCO2 and breath sounds checked- equal and bilateral Tube secured with: Tape Dental Injury: Teeth and Oropharynx as per pre-operative assessment

## 2021-08-28 NOTE — Anesthesia Postprocedure Evaluation (Signed)
Anesthesia Post Note  Patient: [name] [name]  Procedure(s) Performed: Right Lumbar Five - Sacral One, Minimally Invasive Transforaminal Lumbar Interbody Fusion with Metrx (Right: Spine Lumbar)     Patient location during evaluation: PACU Anesthesia Type: General Level of consciousness: awake and alert, patient cooperative and oriented Pain management: pain level controlled Vital Signs Assessment: post-procedure vital signs reviewed and stable Respiratory status: spontaneous breathing, nonlabored ventilation and respiratory function stable Cardiovascular status: blood pressure returned to baseline and stable Postop Assessment: no apparent nausea or vomiting Anesthetic complications: no   No notable events documented.  Last Vitals:  Vitals:   08/28/21 1640 08/28/21 1655  BP: 130/76 126/84  Pulse: (!) 103 99  Resp: 15 16  Temp:  37.3 C  SpO2: 94% 95%    Last Pain:  Vitals:   08/28/21 1655  TempSrc:   PainSc: 3       LLE Sensation: Full sensation (08/28/21 1655)   RLE Sensation: Full sensation (08/28/21 1655)      Najwa Spillane,E. Dayra Rapley

## 2021-08-28 NOTE — Progress Notes (Signed)
Neurosurgery Service Progress Note  Subjective: No acute events overnight, continued severe symptoms, no improvement unfortunately  Objective: Vitals:   08/27/21 1855 08/27/21 2000 08/28/21 0824 08/28/21 1146  BP: 116/74  112/69 127/78  Pulse: 68  (!) 58 66  Resp: 17  18 20   Temp: 98.5 F (36.9 C)  98.5 F (36.9 C) 98.7 F (37.1 C)  TempSrc: Oral  Oral Oral  SpO2: 100%  98% 99%  Weight:  70.3 kg    Height:  5\' 2"  (1.575 m)      Physical Exam: Strength 5/5 except right dorsiflexion / EHL / TA 4/5, SILTx4 except right L5 and S1 numbness  Assessment & Plan: 40 y.o. woman admitted for recurrent disc herniation with foot weakness.   -OR today  Judith Part  08/28/21 12:06 PM

## 2021-08-28 NOTE — Transfer of Care (Signed)
Immediate Anesthesia Transfer of Care Note  Patient: [name] [name]  Procedure(s) Performed: Right Lumbar Five - Sacral One, Minimally Invasive Transforaminal Lumbar Interbody Fusion with Metrx (Right: Spine Lumbar)  Patient Location: PACU  Anesthesia Type:General  Level of Consciousness: awake, alert  and oriented  Airway & Oxygen Therapy: Patient Spontanous Breathing  Post-op Assessment: Report given to RN and Post -op Vital signs reviewed and stable  Post vital signs: Reviewed and stable  Last Vitals:  Vitals Value Taken Time  BP 113/75 08/28/21 1555  Temp    Pulse 116 08/28/21 1601  Resp 14 08/28/21 1601  SpO2 95 % 08/28/21 1601  Vitals shown include unvalidated device data.  Last Pain:  Vitals:   08/28/21 1146  TempSrc: Oral  PainSc: 7          Complications: No notable events documented.

## 2021-08-28 NOTE — Anesthesia Preprocedure Evaluation (Addendum)
Anesthesia Evaluation  Patient identified by MRN, date of birth, ID band Patient awake    Reviewed: Allergy & Precautions, NPO status , Patient's Chart, lab work & pertinent test results  History of Anesthesia Complications Negative for: history of anesthetic complications  Airway Mallampati: I  TM Distance: >3 FB Neck ROM: Full    Dental  (+) Dental Advisory Given   Pulmonary former smoker,    breath sounds clear to auscultation       Cardiovascular negative cardio ROS   Rhythm:Regular Rate:Normal     Neuro/Psych  Headaches, Back pain: narcotics    GI/Hepatic Neg liver ROS, GERD  Controlled,  Endo/Other  negative endocrine ROS  Renal/GU negative Renal ROS     Musculoskeletal   Abdominal   Peds  Hematology negative hematology ROS (+)   Anesthesia Other Findings   Reproductive/Obstetrics S/p BTL                            Anesthesia Physical Anesthesia Plan  ASA: 1  Anesthesia Plan: General   Post-op Pain Management: Tylenol PO (pre-op) and Dilaudid IV   Induction:   PONV Risk Score and Plan: 3 and Ondansetron, Dexamethasone and Scopolamine patch - Pre-op  Airway Management Planned: Oral ETT  Additional Equipment: None  Intra-op Plan:   Post-operative Plan: Extubation in OR  Informed Consent: I have reviewed the patients History and Physical, chart, labs and discussed the procedure including the risks, benefits and alternatives for the proposed anesthesia with the patient or authorized representative who has indicated his/her understanding and acceptance.     Dental advisory given  Plan Discussed with: CRNA and Surgeon  Anesthesia Plan Comments:        Anesthesia Quick Evaluation

## 2021-08-28 NOTE — Op Note (Signed)
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 She presented to the ED with recurrent right lower extremity radicular symptoms with severe, refractory pain and new weakness in the right foot. A repeat MRI showed a large recurrent right L5-S1 herniated nucleus pulposus. She had also developed a new L5-S1 retrolisthesis. Given that this was her third disc herniation at this location and it was producing weakness, I recommended surgical intervention in the form of a right L5-S1 MIS TLIF to perform a complete discectomy. This was discussed with the patient as well as risks, benefits, and alternatives and wished to proceed with surgery.   OPERATIVE DETAIL:  The patient was taken to the operating room and placed on the OR table in the prone position. A formal time out was performed with two patient identifiers and confirmed the operative site. Anesthesia was induced by the anesthesia team. The operative site was marked, hair was clipped with surgical clippers, the area was then prepped and draped in a sterile fashion.   Fluoroscopy was used to localize the surgical level. The pedicles were marked and used to create skin incisions bilaterally. With fluoro guidance, Jamshidi needles were used to guide K-wires into the bilateral L5 and S1 pedicles. The K wires were then secured with hemostats and attention turned to the TLIF.  A MetRx tube was then docked to the right L5-S1 facet through the same incision using  fluoroscopy. A right L5-S1 facetectomy was performed and the right L5 nerve root was decompressed along its entire course. The tube was wanded medially and the decompression was continued medially to midline. A very large disc herniation was present in the right lateral recess and was removed. The traversing root and thecal sac were identified and protected and the disc herniation was removed. I removed scarred ligamentum flavum up to the thecal sac and approached dura with a small durotomy but intact arachnoid without a CSF leak. A complete discectomy was then performed in the standard fashion to prepare for the interbody. The endplates were prepped, bone graft was packed into the disc space, and an expandable cage ([employer]) was packed with autograft and placed into the disc space with fluoroscopic confirmation. The tube was removed and hemostasis was obtained during its removal.   Using the previously placed K wires, a tap and then screw with tower were placed bilaterally at L5 and S1. A rod was sized and introduced on both sides, confirmed with fluoroscopy, then final tightened. Hemostasis was again confirmed for both incisions, they were copiously irrigated, and then closed in layers.    EBL:  63mL   DRAINS: none   SPECIMENS: none   Judith Part, MD 08/28/21 12:07 PM

## 2021-08-29 MED ORDER — HYDROCODONE-ACETAMINOPHEN 7.5-325 MG PO TABS
1.0000 | ORAL_TABLET | Freq: Four times a day (QID) | ORAL | Status: DC | PRN
Start: 2021-08-29 — End: 2021-08-30
  Administered 2021-08-29 – 2021-08-30 (×4): 2 via ORAL
  Filled 2021-08-29 (×4): qty 2

## 2021-08-29 NOTE — Progress Notes (Signed)
Patient required pain medications throughout the night. Minimal effects noted. Pt repositioned frequently. Ambulated approximately 100 feet x 2 this shift. Tolerated well. Standby assist noted.

## 2021-08-29 NOTE — Plan of Care (Signed)

## 2021-08-29 NOTE — Discharge Summary (Signed)
Discharge Summary  Date of Admission: 08/26/2021  Date of Discharge: 08/30/21  Attending Physician: Emelda Brothers, MD  Hospital Course: Patient presented to the ED with worsening pain and new right foot drop. MRI showed an acute recurrent disc herniation at L5-S1. Given her prior 2 LMDs at this level and a new retrolisthesis at 5-1, she was taken to the OR on 12/14 for an L5-S1 MIS TLIF. She was recovered in PACU and transferred to a floor bed. Her foot drop and radicular pain were improved immediately post-op, her hospital course was uncomplicated and the patient was discharged home on 08/30/21. She will follow up in clinic with me in 2 weeks.  Neurologic exam at discharge:  Strength 5/5 x4, SILTx4  Discharge diagnosis: Lumbar radiculopathy  Judith Part, MD 08/29/21 9:19 AM

## 2021-08-29 NOTE — Progress Notes (Signed)
°  Transition of Care (TOC) Screening Note   Patient Details  Name: [name] [name] Date of Birth: [birth year]   Transition of Care [facility]) CM/SW Contact:    Sharin Mons, RN Phone Number: [phone] 08/29/2021, 12:37 PM      - s/p Right L5-S1 minimally invasive transforaminal lumbar interbody fusion with bilateral L5-S1 pedicle screw placement, 12/14  Transition of Care Department [facility]) has reviewed patient and no TOC needs have been identified at this time. We will continue to monitor patient advancement through interdisciplinary progression rounds. If new patient transition needs arise, please place a TOC consult.

## 2021-08-29 NOTE — Plan of Care (Signed)

## 2021-08-29 NOTE — Progress Notes (Signed)
Neurosurgery Service Progress Note  Subjective: No acute events overnight, incisional pain severe and not great control overnight but preop radicular pain / numbness / weakness resolved immediately post-op  Objective: Vitals:   08/28/21 2052 08/29/21 0100 08/29/21 0514 08/29/21 0715  BP: 109/64 110/70 (!) 102/54 107/67  Pulse: 82 79 63 65  Resp: 16 17 16 17   Temp: 98.3 F (36.8 C) 97.9 F (36.6 C) 98.6 F (37 C) 99.1 F (37.3 C)  TempSrc: Oral Oral Oral Oral  SpO2: 99% 100% 100% 100%  Weight:      Height:        Physical Exam: Strength 5/5 x4, SILTx4, incisions c/d/i  Assessment & Plan: 40 y.o. woman w/ recurrent HNP x2 w/ foot weakness s/p MIS TLIF, recovering well.  -will change oxycodone to hydrocodone for potentially better pain control -PT/OT -discharge pending pain control, potentially tomorrow if doing better -SCDs/TEDs, SQH POD2  Judith Part  08/29/21 9:18 AM

## 2021-08-30 MED ORDER — METHOCARBAMOL 500 MG PO TABS: 500.0000 mg | ORAL_TABLET | Freq: Four times a day (QID) | ORAL | 1 refills | Status: DC | PRN

## 2021-08-30 MED ORDER — DOCUSATE SODIUM 100 MG PO CAPS: 100.0000 mg | ORAL_CAPSULE | Freq: Two times a day (BID) | ORAL | 0 refills | Status: DC

## 2021-08-30 MED ORDER — HYDROCODONE-ACETAMINOPHEN 7.5-325 MG PO TABS: 1.0000 | ORAL_TABLET | Freq: Four times a day (QID) | ORAL | 0 refills | Status: DC | PRN

## 2021-08-30 NOTE — Progress Notes (Signed)
Discharge instructions (including medications) discussed with and copy provided to patient/caregiver 

## 2021-08-30 NOTE — Progress Notes (Signed)
° °  Providing Compassionate, Quality Care - Together  NEUROSURGERY PROGRESS NOTE   S: No issues overnight. Ready for dc  O: EXAM:  BP 118/66 (BP Location: Right Arm)    Pulse 99    Temp 97.6 F (36.4 C) (Oral)    Resp 20    Ht 5\' 2"  (1.575 m)    Wt 70.3 kg    LMP 08/13/2021    SpO2 100%    BMI 28.35 kg/m   Awake, alert, oriented x3 PERRL Speech fluent, appropriate  CNs grossly intact  5/5 BUE/BLE  Incisions c/d/i  ASSESSMENT:  40 y.o. female with   R L5-S1 MIS TLIF  PLAN: - dc home - doing well    Thank you for allowing me to participate in this patient's care.  Please do not hesitate to call with questions or concerns.   Elwin Sleight, [facility] Cell: [phone]

## 2021-09-02 ENCOUNTER — Encounter (HOSPITAL_COMMUNITY): Payer: Self-pay | Admitting: Neurological Surgery

## 2022-02-11 ENCOUNTER — Other Ambulatory Visit: Payer: Self-pay | Admitting: Neurological Surgery

## 2022-02-11 DIAGNOSIS — S32009K Unspecified fracture of unspecified lumbar vertebra, subsequent encounter for fracture with nonunion: Secondary | ICD-10-CM

## 2022-02-18 ENCOUNTER — Ambulatory Visit
Admission: RE | Admit: 2022-02-18 | Discharge: 2022-02-18 | Disposition: A | Discharge status: Home / Self Care | Payer: 59 | Source: Ambulatory Visit | Attending: Neurological Surgery | Admitting: Neurological Surgery

## 2022-02-18 DIAGNOSIS — S32009K Unspecified fracture of unspecified lumbar vertebra, subsequent encounter for fracture with nonunion: Secondary | ICD-10-CM

## 2022-02-18 IMAGING — CT CT L SPINE W/O CM
4 of 5 series · 13 of 33 positions shown, 15 images · non-contrast
Comparison: MRI lumbar spine [DATE]. CT of the lumbar spine
without contrast [DATE]

CLINICAL DATA: Lumbar pseudoarthrosis. Persistent lumbar pain.
Bilateral leg pain and numbness.



[Series 3: l-spine 2.00 br40 s3 (person_name) · axial · 0.34mm/px · z∈[+1451,+1583]mm · 3 of 134 slices shown, 4 images]
[im 34/134  soft-tissue]
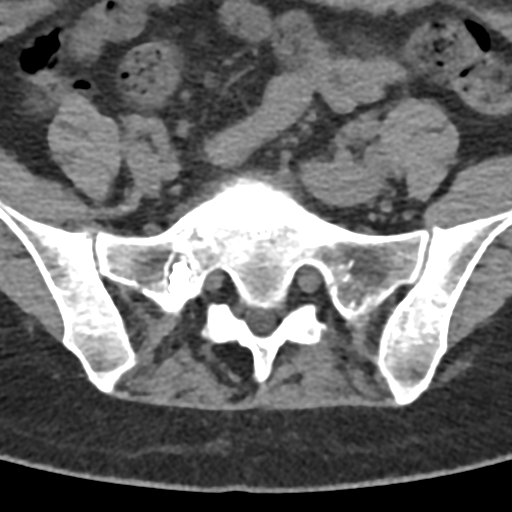
[im 34/134  bone]
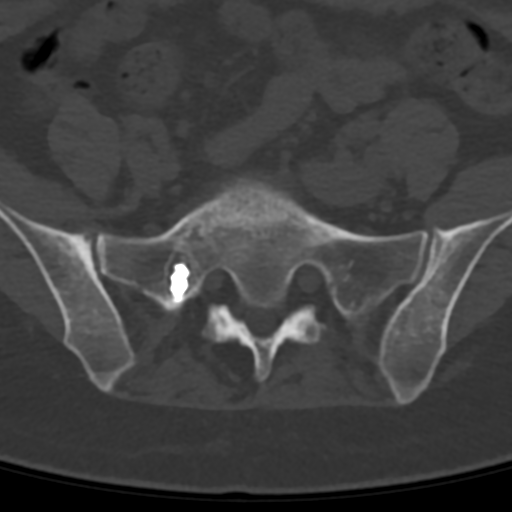
[im 67/134  bone]
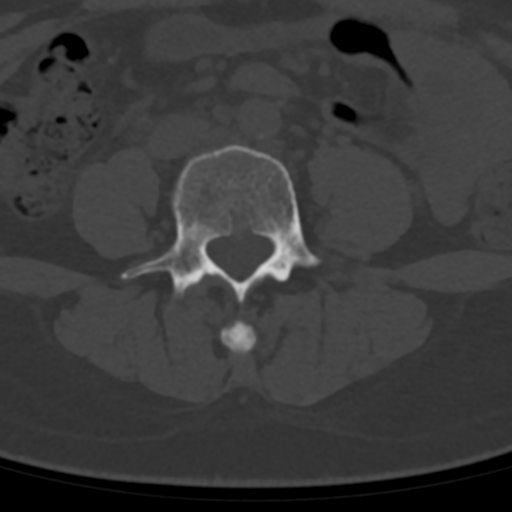
[im 100/134  bone]
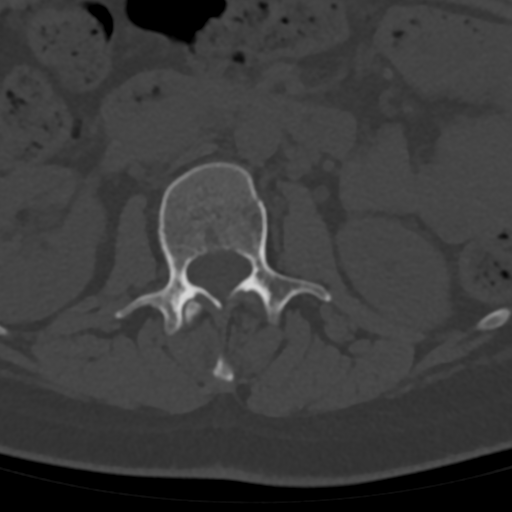

[Series 4: l-spine 2.00 br60 s3 lspine bone · axial · 0.34mm/px · z∈[+1451,+1517]mm · 2 of 134 slices shown]
[im 34/134  bone]
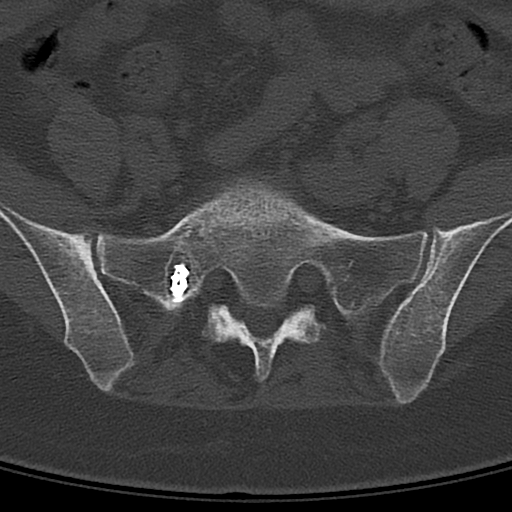
[im 67/134  bone]
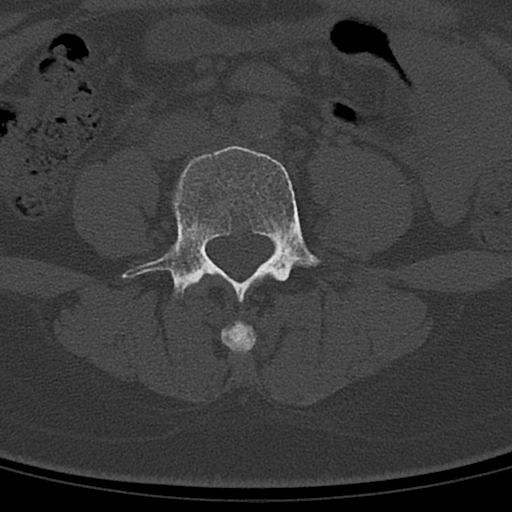

[Series 7: l-spine 2.00 br44 s3 sag soft · sagittal · 0.35mm/px · 5 of 92 slices shown, 6 images]
[im 31/92  bone]
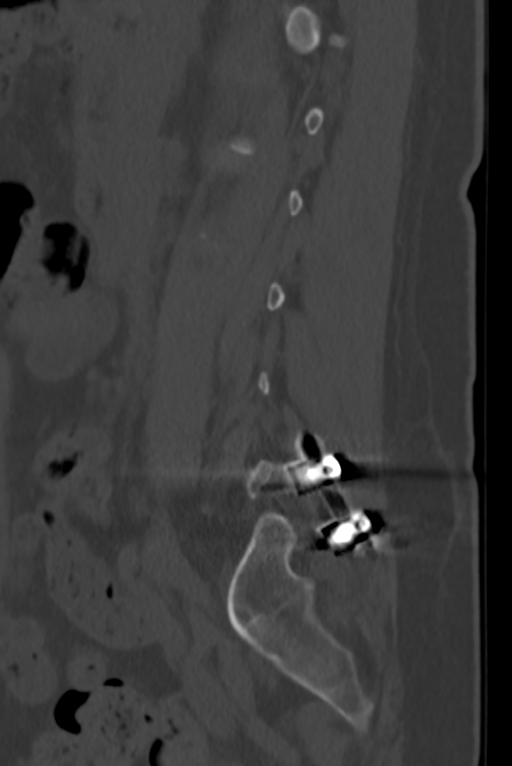
[im 38/92  bone]
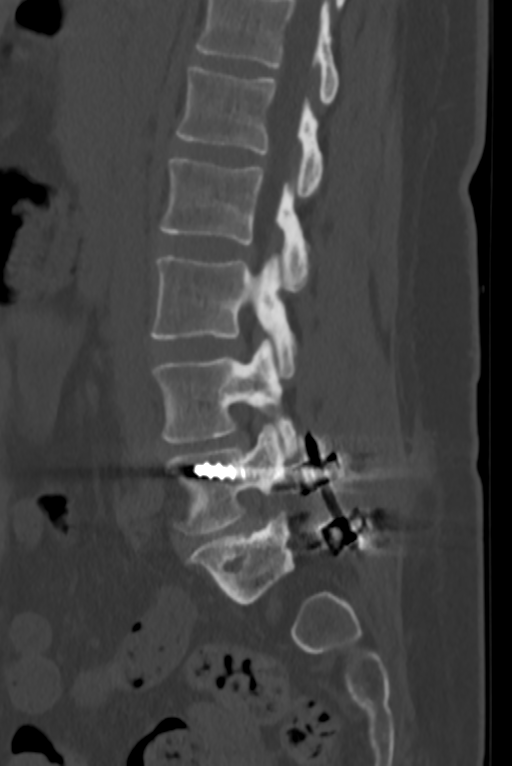
[im 46/92  soft-tissue]
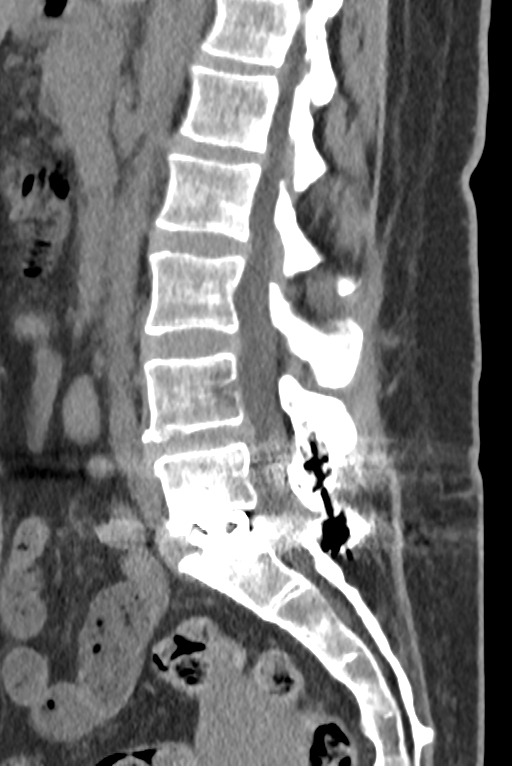
[im 46/92  bone]
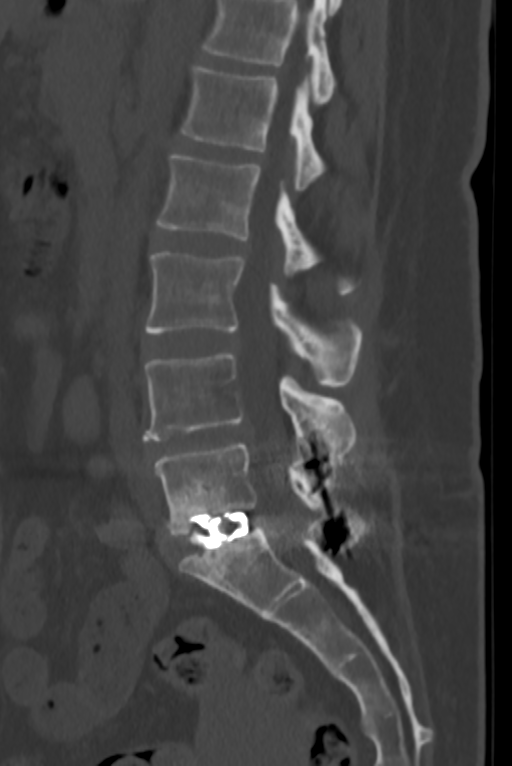
[im 54/92  bone]
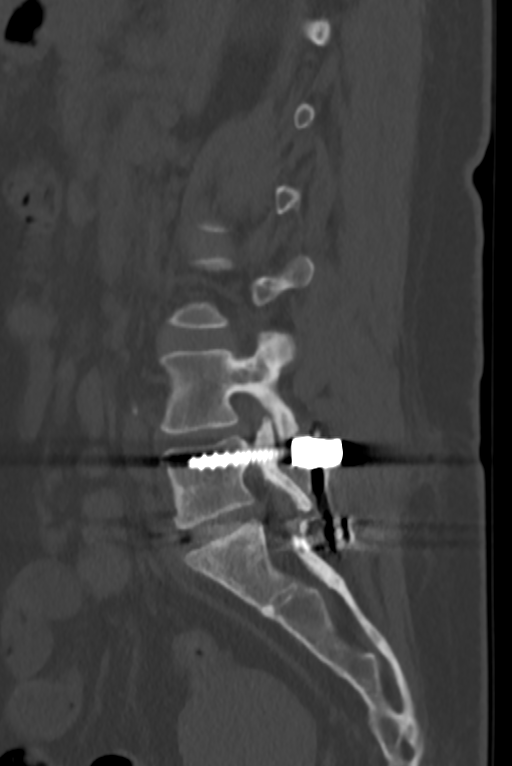
[im 61/92  bone]
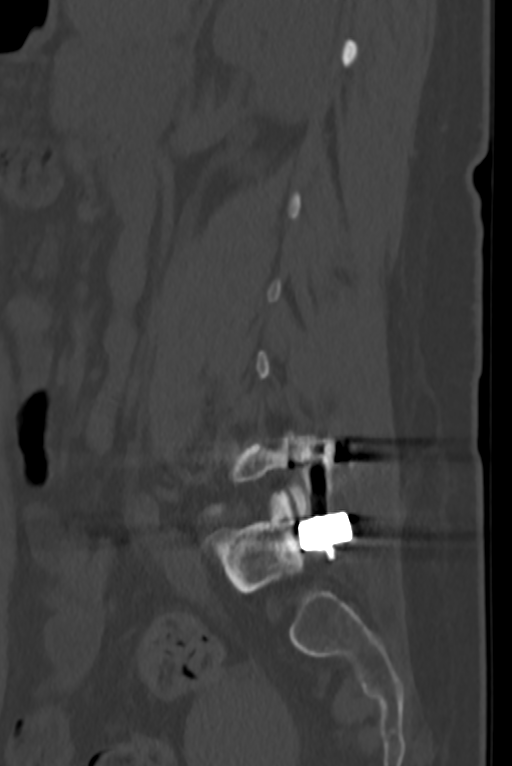

[Series 9: l-spine 2.00 br60 s3 cor bone · coronal · 0.43mm/px · 3 of 86 slices shown]
[im 18/86  bone]
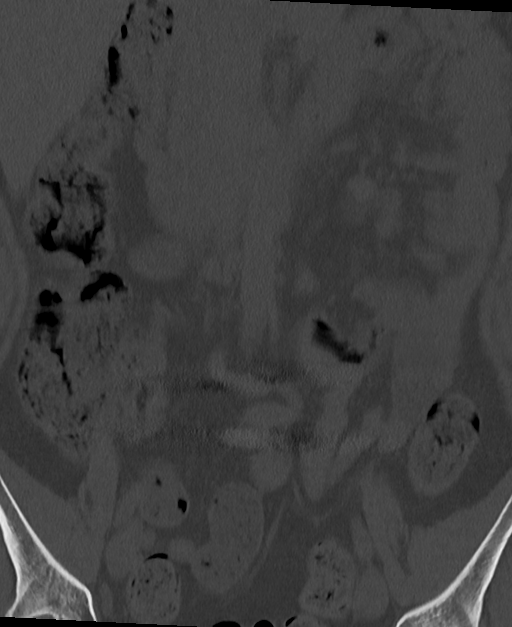
[im 35/86  bone]
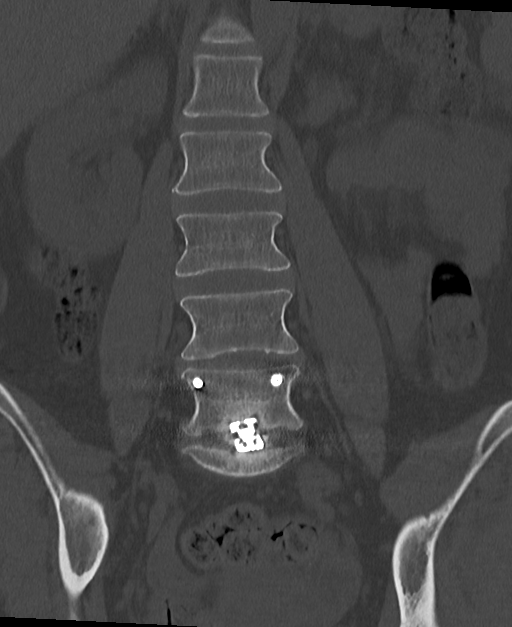
[im 52/86  bone]
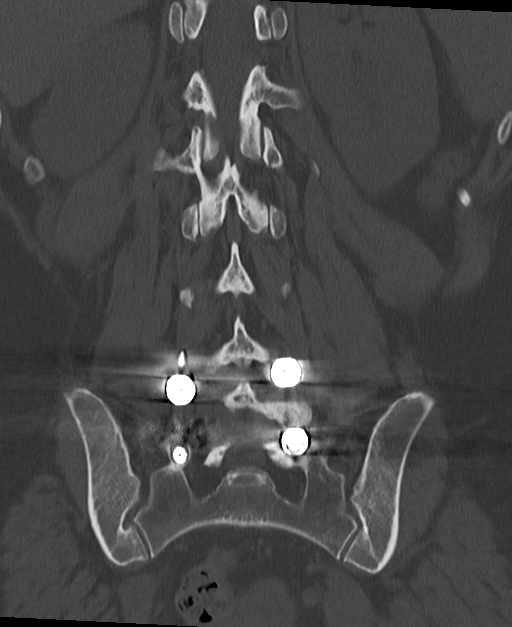

[13 of 33 positions shown; findings below may reference images not displayed]

FINDINGS: Segmentation: 5 non rib-bearing lumbar type vertebral bodies are
present. The lowest fully formed vertebral body is L5.

Alignment: No significant listhesis is present. Mild rightward
curvature of the upper lumbar spine is stable.

Vertebrae: Reactive changes are noted about the discectomy at L5-S1.
Disc spacers in place. Early bridging bone is noted. Lucency is
present about the right S1 pedicle screw. The other pedicle screws
are well seated. Hardware is intact. Vertebral body heights are
otherwise normal. No focal osseous lesions are present.

Paraspinal and other soft tissues: Limited imaging the abdomen is
unremarkable. There is no significant adenopathy. No solid organ
lesions are present.

Disc levels: L2-3 scratched at L1-2: Negative.

L2-3: Negative.

L3-4: Mild disc bulging is present. Short pedicles result in mild
bilateral foraminal narrowing, left greater than right.

L4-5: A mild broad-based disc protrusion is present. Mild
subarticular and foraminal narrowing is stable.

L5-S1: Right laminectomy again noted. Right laminectomy extended.
Foraminotomy now present. No residual recurrent stenosis is evident.
Early bridging bone is present across the disc space. No bridging
bone across the facets.
IMPRESSION: 1. Early bridging bone across the disc space at L5-S1.
2. Lucency about the right S1 pedicle screw suggesting loosening.
3. Right laminectomy and facetectomy at L5-S1 without residual or
recurrent stenosis.
4. Mild subarticular and foraminal narrowing bilaterally at L4-5 is
stable.
5. Mild bilateral foraminal narrowing at L3-4 is stable.
6. Mild rightward curvature of the upper lumbar spine is stable.

## 2022-03-10 ENCOUNTER — Other Ambulatory Visit: Payer: Self-pay | Admitting: Neurological Surgery

## 2022-03-25 NOTE — Pre-Procedure Instructions (Signed)
Surgical Instructions    Your procedure is scheduled on March 31, 2022.  Report to [facility] Main Entrance "A" at 9:15 A.M., then check in with the Admitting office.  Call this number if you have problems the morning of surgery:  [phone]   If you have any questions prior to your surgery date call [phone]: Open Monday-Friday 8am-4pm    Remember:  Do not eat after midnight the night before your surgery  You may drink clear liquids until 8:15 AM the morning of your surgery.   Clear liquids allowed are: Water, Non-Citrus Juices (without pulp), Carbonated Beverages, Clear Tea, Black Coffee Only (NO MILK, CREAM OR POWDERED CREAMER of any kind), and Gatorade.     Take these medicines the morning of surgery with A SIP OF WATER:  methocarbamol (ROBAXIN)   Take these medicines the morning of surgery AS NEEDED:  acetaminophen (TYLENOL)  gabapentin (NEURONTIN)  ondansetron (ZOFRAN)  oxyCODONE-acetaminophen (PERCOCET/ROXICET)    As of today, STOP taking any Aspirin (unless otherwise instructed by your surgeon) Aleve, Naproxen, Ibuprofen, Motrin, Advil, Goody's, BC's, all herbal medications, fish oil, and all vitamins.                      Do NOT Smoke (Tobacco/Vaping) for 24 hours prior to your procedure.  If you use a CPAP at night, you may bring your mask/headgear for your overnight stay.   Contacts, glasses, piercing's, hearing aid's, dentures or partials may not be worn into surgery, please bring cases for these belongings.    For patients admitted to the hospital, discharge time will be determined by your treatment team.   Patients discharged the day of surgery will not be allowed to drive home, and someone needs to stay with them for 24 hours.  SURGICAL WAITING ROOM VISITATION Patients having surgery or a procedure may have two support people in the waiting room. These visitors may be switched out with other visitors if needed. Children under the age of 33 must have an  adult accompany them who is not the patient. If the patient needs to stay at the hospital during part of their recovery, the visitor guidelines for inpatient rooms apply.  Please refer to the [facility] website for the visitor guidelines for Inpatients (after your surgery is over and you are in a regular room).    Special instructions:   [place]- Preparing For Surgery  Before surgery, you can play an important role. Because skin is not sterile, your skin needs to be as free of germs as possible. You can reduce the number of germs on your skin by washing with CHG (chlorahexidine gluconate) Soap before surgery.  CHG is an antiseptic cleaner which kills germs and bonds with the skin to continue killing germs even after washing.    Oral Hygiene is also important to reduce your risk of infection.  Remember - BRUSH YOUR TEETH THE MORNING OF SURGERY WITH YOUR REGULAR TOOTHPASTE  Please do not use if you have an allergy to CHG or antibacterial soaps. If your skin becomes reddened/irritated stop using the CHG.  Do not shave (including legs and underarms) for at least 48 hours prior to first CHG shower. It is OK to shave your face.  Please follow these instructions carefully.   Shower the NIGHT BEFORE SURGERY and the MORNING OF SURGERY  If you chose to wash your hair, wash your hair first as usual with your normal shampoo.  After you shampoo, rinse your hair and  body thoroughly to remove the shampoo.  Use CHG Soap as you would any other liquid soap. You can apply CHG directly to the skin and wash gently with a scrungie or a clean washcloth.   Apply the CHG Soap to your body ONLY FROM THE NECK DOWN.  Do not use on open wounds or open sores. Avoid contact with your eyes, ears, mouth and genitals (private parts). Wash Face and genitals (private parts)  with your normal soap.   Wash thoroughly, paying special attention to the area where your surgery will be performed.  Thoroughly rinse your body  with warm water from the neck down.  DO NOT shower/wash with your normal soap after using and rinsing off the CHG Soap.  Pat yourself dry with a CLEAN TOWEL.  Wear CLEAN PAJAMAS to bed the night before surgery  Place CLEAN SHEETS on your bed the night before your surgery  DO NOT SLEEP WITH PETS.   Day of Surgery: Take a shower with CHG soap. Do not wear jewelry or makeup Do not wear lotions, powders, perfumes/colognes, or deodorant. Do not shave 48 hours prior to surgery.  Men may shave face and neck. Do not bring valuables to the hospital.  [facility] is not responsible for any belongings or valuables. Do not wear nail polish, gel polish, artificial nails, or any other type of covering on natural nails (fingers and toes) If you have artificial nails or gel coating that need to be removed by a nail salon, please have this removed prior to surgery. Artificial nails or gel coating may interfere with anesthesia's ability to adequately monitor your vital signs. Wear Clean/Comfortable clothing the morning of surgery Remember to brush your teeth WITH YOUR REGULAR TOOTHPASTE.   Please read over the following fact sheets that you were given.    If you received a COVID test during your pre-op visit  it is requested that you wear a mask when out in public, stay away from anyone that may not be feeling well and notify your surgeon if you develop symptoms. If you have been in contact with anyone that has tested positive in the last 10 days please notify you surgeon.

## 2022-03-26 ENCOUNTER — Encounter (HOSPITAL_COMMUNITY)
Admission: RE | Admit: 2022-03-26 | Discharge: 2022-03-26 | Disposition: A | Discharge status: Home / Self Care | Payer: 59 | Source: Ambulatory Visit | Attending: Neurological Surgery | Admitting: Neurological Surgery

## 2022-03-26 ENCOUNTER — Encounter (HOSPITAL_COMMUNITY): Payer: Self-pay

## 2022-03-26 ENCOUNTER — Other Ambulatory Visit: Payer: Self-pay

## 2022-03-26 VITALS — BP 101/73 | HR 70 | Temp 97.8°F | Resp 16 | Ht 62.0 in | Wt 162.4 lb

## 2022-03-26 DIAGNOSIS — M48061 Spinal stenosis, lumbar region without neurogenic claudication: Secondary | ICD-10-CM | POA: Insufficient documentation

## 2022-03-26 DIAGNOSIS — Z87891 Personal history of nicotine dependence: Secondary | ICD-10-CM | POA: Diagnosis not present

## 2022-03-26 DIAGNOSIS — Z01812 Encounter for preprocedural laboratory examination: Secondary | ICD-10-CM | POA: Insufficient documentation

## 2022-03-26 DIAGNOSIS — M797 Fibromyalgia: Secondary | ICD-10-CM | POA: Diagnosis not present

## 2022-03-26 DIAGNOSIS — K219 Gastro-esophageal reflux disease without esophagitis: Secondary | ICD-10-CM | POA: Diagnosis not present

## 2022-03-26 DIAGNOSIS — M960 Pseudarthrosis after fusion or arthrodesis: Secondary | ICD-10-CM | POA: Diagnosis not present

## 2022-03-26 DIAGNOSIS — Z01818 Encounter for other preprocedural examination: Secondary | ICD-10-CM

## 2022-03-26 LAB — CBC
HCT: 41.5 % (ref 36.0–46.0)
Hemoglobin: 14.1 g/dL (ref 12.0–15.0)
MCH: 32 pg (ref 26.0–34.0)
MCHC: 34 g/dL (ref 30.0–36.0)
MCV: 94.1 fL (ref 80.0–100.0)
Platelets: 269 10*3/uL (ref 150–400)
RBC: 4.41 MIL/uL (ref 3.87–5.11)
RDW: 11.8 % (ref 11.5–15.5)
WBC: 3.9 10*3/uL — ABNORMAL LOW (ref 4.0–10.5)
nRBC: 0 % (ref 0.0–0.2)

## 2022-03-26 LAB — TYPE AND SCREEN
ABO/RH(D): A POS
Antibody Screen: NEGATIVE

## 2022-03-26 LAB — SURGICAL PCR SCREEN
MRSA, PCR: NEGATIVE
Staphylococcus aureus: POSITIVE — AB

## 2022-03-26 NOTE — Anesthesia Preprocedure Evaluation (Addendum)
Anesthesia Evaluation  Patient identified by MRN, date of birth, ID band Patient awake    Reviewed: Allergy & Precautions, H&P , NPO status , Patient's Chart, lab work & pertinent test results  Airway Mallampati: II  TM Distance: >3 FB Neck ROM: Full    Dental no notable dental hx. (+) Teeth Intact, Dental Advisory Given   Pulmonary neg pulmonary ROS, former smoker,    Pulmonary exam normal breath sounds clear to auscultation       Cardiovascular negative cardio ROS   Rhythm:Regular Rate:Normal     Neuro/Psych  Headaches, negative psych ROS   GI/Hepatic Neg liver ROS, GERD  Medicated,  Endo/Other  negative endocrine ROS  Renal/GU negative Renal ROS  negative genitourinary   Musculoskeletal  (+) Fibromyalgia -  Abdominal   Peds  Hematology negative hematology ROS (+)   Anesthesia Other Findings   Reproductive/Obstetrics negative OB ROS                           Anesthesia Physical Anesthesia Plan  ASA: 2  Anesthesia Plan: General   Post-op Pain Management: Tylenol PO (pre-op)*   Induction: Intravenous  PONV Risk Score and Plan: 4 or greater and Ondansetron, Dexamethasone and Midazolam  Airway Management Planned: Oral ETT  Additional Equipment:   Intra-op Plan:   Post-operative Plan: Extubation in OR  Informed Consent: I have reviewed the patients History and Physical, chart, labs and discussed the procedure including the risks, benefits and alternatives for the proposed anesthesia with the patient or authorized representative who has indicated his/her understanding and acceptance.     Dental advisory given  Plan Discussed with: CRNA  Anesthesia Plan Comments: (PAT note written 03/26/2022 by Myra Gianotti, PA-C. )       Anesthesia Quick Evaluation

## 2022-03-26 NOTE — Progress Notes (Signed)
Anesthesia Chart Review:  Case: [id number] Date/Time: 03/31/22 1100   Procedure: L5-S1 Posterolateral fusion/revision of L5-S1 Pseudoarthrosis - 3C/RM 18   Anesthesia type: General   Pre-op diagnosis: Lumbar pseudoarthrosis   Location: [facility] OR ROOM 80 / [place] OR   Surgeons: Judith Part, MD       DISCUSSION: Patient is a 41 year old female scheduled for the above procedure.  History includes former smoker (quit 12/02/07), fibromyalgia, GERD, lumbar fusion (lumbar microdiscectomy x2; right L5-S1 TLIF 08/28/21), tubal ligation (06/15/13), right breast biopsy (benign, 08/03/14). She reported one provider heard a murmur years ago, but not heard since and no testing ever recommended.  She denied chest pain, SOB, dizziness, syncope. Back issues limit her some, but tries to stay active and is able to walk up a flight or stairs. Denied known anesthesia complications. She tolerated lumbar fusion about 7 months ago.   For urine pregnancy test on arrival.  Anesthesia team to evaluate on the day of surgery.  VS: BP 101/73   Pulse 70   Temp 36.6 C (Oral)   Resp 16   Ht '5\' 2"'$  (1.575 m)   Wt 73.7 kg   LMP 03/18/2022 (Approximate)   SpO2 100%   BMI 29.70 kg/m  Heart RRR, no murmur noted. Lungs clear.    PROVIDERS: PCP is with [facility] in [place]: Labs reviewed: Acceptable for surgery. (all labs ordered are listed, but only abnormal results are displayed)  Labs Reviewed  CBC - Abnormal; Notable for the following components:      Result Value   WBC 3.9 (*)    All other components within normal limits  SURGICAL PCR SCREEN  TYPE AND SCREEN     IMAGES: CT L-spine 02/18/22: IMPRESSION: 1. Early bridging bone across the disc space at L5-S1. 2. Lucency about the right S1 pedicle screw suggesting loosening. 3. Right laminectomy and facetectomy at L5-S1 without residual or recurrent stenosis. 4. Mild subarticular and foraminal narrowing bilaterally at L4-5  is stable. 5. Mild bilateral foraminal narrowing at L3-4 is stable. 6. Mild rightward curvature of the upper lumbar spine is stable.   EKG: EKG 02/19/21 (during ED visit for acute cystitis): Ventricular rate 147 bpm Sinus tachycardia with short PR Nonspecific ST changes likely rate related Abnormal ECG No old tracing for comparison Confirmed by Nanda Quinton [phone]) on 02/19/2021 8:43:51 PM   CV: N/A    Past Medical History:  Diagnosis Date   Boil 11/20/2014   Breast mass, right 07/2014   Colitis    no current problems   Fibromyalgia    GERD (gastroesophageal reflux disease)    Heart murmur    states no known problem   Sebaceous cyst 11/20/2014    Past Surgical History:  Procedure Laterality Date   BACK SURGERY  2022   x3   COLPOSCOPY     x 2   LAPAROSCOPIC TUBAL LIGATION Bilateral 06/15/2013   Procedure: LAPAROSCOPIC BILATERAL TUBAL LIGATION WITH CAUTERY;  Surgeon: Florian Buff, MD;  Location: [facility];  Service: Gynecology;  Laterality: Bilateral;   RADIOACTIVE SEED GUIDED EXCISIONAL BREAST BIOPSY Right 08/03/2014   Procedure: RIGHT BREAST SEED GUIDED EXCISION;  Surgeon: Rolm Bookbinder, MD;  Location: [place];  Service: General;  Laterality: Right;   TRANSFORAMINAL LUMBAR INTERBODY FUSION W/ MIS 1 LEVEL Right 08/28/2021   Procedure: Right Lumbar Five - Sacral One, Minimally Invasive Transforaminal Lumbar Interbody Fusion with Metrx;  Surgeon: Judith Part, MD;  Location:  [place] OR;  Service: Neurosurgery;  Laterality: Right;   WISDOM TOOTH EXTRACTION      MEDICATIONS:  acetaminophen (TYLENOL) 325 MG tablet   bismuth subsalicylate (PEPTO BISMOL) 262 MG chewable tablet   gabapentin (NEURONTIN) 300 MG capsule   ibuprofen (ADVIL) 200 MG tablet   methocarbamol (ROBAXIN) 500 MG tablet   ondansetron (ZOFRAN) 4 MG tablet   oxyCODONE-acetaminophen (PERCOCET/ROXICET) 5-325 MG tablet   No current facility-administered medications for this encounter.     Myra Gianotti, PA-C Surgical Short Stay/Anesthesiology [facility] Phone [phone] [facility] Phone [phone] 03/26/2022 12:11 PM

## 2022-03-26 NOTE — Progress Notes (Signed)
PCP - [facility]- [place] Cardiologist - denies  PPM/ICD - denies   Chest x-ray - 10/16/17 EKG - 02/22/21 Stress Test - denies ECHO - denies Cardiac Cath - denies  Sleep Study - denies   DM- denies  ASA/Blood Thinner Instructions: n/a   ERAS Protcol - yes, no drink   COVID TEST- n/a   Anesthesia review: no  Patient denies shortness of breath, fever, cough and chest pain at PAT appointment   All instructions explained to the patient, with a verbal understanding of the material. Patient agrees to go over the instructions while at home for a better understanding. The opportunity to ask questions was provided.

## 2022-03-31 ENCOUNTER — Observation Stay (HOSPITAL_COMMUNITY)
Admission: RE | Admit: 2022-03-31 | Discharge: 2022-04-01 | Disposition: A | Discharge status: Home / Self Care | Payer: 59 | Attending: Neurological Surgery | Admitting: Neurological Surgery

## 2022-03-31 ENCOUNTER — Ambulatory Visit (HOSPITAL_COMMUNITY): Payer: 59 | Admitting: Physician Assistant

## 2022-03-31 ENCOUNTER — Other Ambulatory Visit: Payer: Self-pay

## 2022-03-31 ENCOUNTER — Ambulatory Visit (HOSPITAL_BASED_OUTPATIENT_CLINIC_OR_DEPARTMENT_OTHER): Payer: 59 | Admitting: Physician Assistant

## 2022-03-31 ENCOUNTER — Encounter (HOSPITAL_COMMUNITY)
Admission: RE | Disposition: A | Discharge status: Home / Self Care | Payer: Self-pay | Source: Home / Self Care | Attending: Neurological Surgery

## 2022-03-31 ENCOUNTER — Encounter (HOSPITAL_COMMUNITY): Payer: Self-pay | Admitting: Neurological Surgery

## 2022-03-31 ENCOUNTER — Ambulatory Visit (HOSPITAL_COMMUNITY): Payer: 59

## 2022-03-31 DIAGNOSIS — Z87891 Personal history of nicotine dependence: Secondary | ICD-10-CM | POA: Insufficient documentation

## 2022-03-31 DIAGNOSIS — M960 Pseudarthrosis after fusion or arthrodesis: Secondary | ICD-10-CM | POA: Diagnosis present

## 2022-03-31 DIAGNOSIS — S32009K Unspecified fracture of unspecified lumbar vertebra, subsequent encounter for fracture with nonunion: Secondary | ICD-10-CM | POA: Diagnosis present

## 2022-03-31 HISTORY — PX: LAMINECTOMY WITH POSTERIOR LATERAL ARTHRODESIS LEVEL 1: SHX6335

## 2022-03-31 SURGERY — LAMINECTOMY WITH POSTERIOR LATERAL ARTHRODESIS LEVEL 1
Anesthesia: General | Site: Spine Lumbar

## 2022-03-31 MED ORDER — HYDROMORPHONE HCL 1 MG/ML IJ SOLN
INTRAMUSCULAR | Status: AC
  Filled 2022-03-31: qty 0.5

## 2022-03-31 MED ORDER — MIDAZOLAM HCL 2 MG/2ML IJ SOLN
INTRAMUSCULAR | Status: AC
  Filled 2022-03-31: qty 2

## 2022-03-31 MED ORDER — THROMBIN 5000 UNITS EX SOLR
CUTANEOUS | Status: AC
  Filled 2022-03-31: qty 5000

## 2022-03-31 MED ORDER — CHLORHEXIDINE GLUCONATE 0.12 % MT SOLN: 15.0000 mL | Freq: Once | OROMUCOSAL | Status: AC

## 2022-03-31 MED ORDER — ONDANSETRON HCL 4 MG PO TABS: 4.0000 mg | ORAL_TABLET | Freq: Four times a day (QID) | ORAL | Status: DC | PRN

## 2022-03-31 MED ORDER — SODIUM CHLORIDE 0.9 % IV SOLN
250.0000 mL | INTRAVENOUS | Status: DC
  Administered 2022-03-31: 250 mL via INTRAVENOUS

## 2022-03-31 MED ORDER — HYDROMORPHONE HCL 1 MG/ML IJ SOLN
INTRAMUSCULAR | Status: AC
  Filled 2022-03-31: qty 1

## 2022-03-31 MED ORDER — CEFAZOLIN SODIUM-DEXTROSE 2-4 GM/100ML-% IV SOLN: 2.0000 g | Freq: Three times a day (TID) | INTRAVENOUS | Status: DC

## 2022-03-31 MED ORDER — ORAL CARE MOUTH RINSE: 15.0000 mL | Freq: Once | OROMUCOSAL | Status: AC

## 2022-03-31 MED ORDER — OXYCODONE HCL 5 MG PO TABS
10.0000 mg | ORAL_TABLET | ORAL | Status: DC | PRN
  Administered 2022-03-31 – 2022-04-01 (×5): 10 mg via ORAL
  Filled 2022-03-31 (×5): qty 2

## 2022-03-31 MED ORDER — MENTHOL 3 MG MT LOZG: 1.0000 | LOZENGE | OROMUCOSAL | Status: DC | PRN

## 2022-03-31 MED ORDER — FENTANYL CITRATE (PF) 250 MCG/5ML IJ SOLN
INTRAMUSCULAR | Status: AC
  Filled 2022-03-31: qty 5

## 2022-03-31 MED ORDER — 0.9 % SODIUM CHLORIDE (POUR BTL) OPTIME
TOPICAL | Status: DC | PRN
  Administered 2022-03-31: 1000 mL

## 2022-03-31 MED ORDER — PROPOFOL 500 MG/50ML IV EMUL
INTRAVENOUS | Status: DC | PRN
  Administered 2022-03-31: 150 ug/kg/min via INTRAVENOUS

## 2022-03-31 MED ORDER — DOCUSATE SODIUM 100 MG PO CAPS
100.0000 mg | ORAL_CAPSULE | Freq: Two times a day (BID) | ORAL | Status: DC
  Administered 2022-03-31 (×2): 100 mg via ORAL
  Filled 2022-03-31 (×2): qty 1

## 2022-03-31 MED ORDER — LIDOCAINE-EPINEPHRINE 1 %-1:100000 IJ SOLN
INTRAMUSCULAR | Status: DC | PRN
  Administered 2022-03-31: 10 mL

## 2022-03-31 MED ORDER — DEXAMETHASONE SODIUM PHOSPHATE 10 MG/ML IJ SOLN
INTRAMUSCULAR | Status: DC | PRN
  Administered 2022-03-31: 10 mg via INTRAVENOUS

## 2022-03-31 MED ORDER — SUGAMMADEX SODIUM 200 MG/2ML IV SOLN
INTRAVENOUS | Status: DC | PRN
  Administered 2022-03-31: 150 mg via INTRAVENOUS

## 2022-03-31 MED ORDER — KETAMINE HCL 50 MG/5ML IJ SOSY
PREFILLED_SYRINGE | INTRAMUSCULAR | Status: AC
  Filled 2022-03-31: qty 5

## 2022-03-31 MED ORDER — LACTATED RINGERS IV SOLN: INTRAVENOUS | Status: DC

## 2022-03-31 MED ORDER — CHLORHEXIDINE GLUCONATE CLOTH 2 % EX PADS: 6.0000 | MEDICATED_PAD | Freq: Once | CUTANEOUS | Status: DC

## 2022-03-31 MED ORDER — ROCURONIUM BROMIDE 100 MG/10ML IV SOLN
INTRAVENOUS | Status: DC | PRN
  Administered 2022-03-31: 10 mg via INTRAVENOUS
  Administered 2022-03-31: 20 mg via INTRAVENOUS
  Administered 2022-03-31: 60 mg via INTRAVENOUS
  Administered 2022-03-31: 10 mg via INTRAVENOUS

## 2022-03-31 MED ORDER — DIPHENHYDRAMINE HCL 50 MG/ML IJ SOLN
INTRAMUSCULAR | Status: DC | PRN
  Administered 2022-03-31: 6.25 mg via INTRAVENOUS

## 2022-03-31 MED ORDER — OXYCODONE HCL 5 MG PO TABS: 5.0000 mg | ORAL_TABLET | ORAL | Status: DC | PRN

## 2022-03-31 MED ORDER — KETAMINE HCL 10 MG/ML IJ SOLN
INTRAMUSCULAR | Status: DC | PRN
  Administered 2022-03-31 (×3): 10 mg via INTRAVENOUS

## 2022-03-31 MED ORDER — THROMBIN 5000 UNITS EX SOLR: OROMUCOSAL | Status: DC | PRN

## 2022-03-31 MED ORDER — PHENOL 1.4 % MT LIQD: 1.0000 | OROMUCOSAL | Status: DC | PRN

## 2022-03-31 MED ORDER — PROPOFOL 10 MG/ML IV BOLUS
INTRAVENOUS | Status: AC
  Filled 2022-03-31: qty 20

## 2022-03-31 MED ORDER — ONDANSETRON HCL 4 MG/2ML IJ SOLN
INTRAMUSCULAR | Status: AC
  Filled 2022-03-31: qty 2

## 2022-03-31 MED ORDER — ACETAMINOPHEN 650 MG RE SUPP: 650.0000 mg | RECTAL | Status: DC | PRN

## 2022-03-31 MED ORDER — VANCOMYCIN HCL IN DEXTROSE 1-5 GM/200ML-% IV SOLN
1000.0000 mg | Freq: Two times a day (BID) | INTRAVENOUS | Status: AC
  Administered 2022-03-31: 1000 mg via INTRAVENOUS
  Filled 2022-03-31: qty 200

## 2022-03-31 MED ORDER — CHLORHEXIDINE GLUCONATE 0.12 % MT SOLN
OROMUCOSAL | Status: AC
  Administered 2022-03-31: 15 mL via OROMUCOSAL
  Filled 2022-03-31: qty 15

## 2022-03-31 MED ORDER — FENTANYL CITRATE (PF) 100 MCG/2ML IJ SOLN
INTRAMUSCULAR | Status: DC | PRN
Start: 2022-03-31 — End: 2022-03-31
  Administered 2022-03-31 (×3): 50 ug via INTRAVENOUS
  Administered 2022-03-31: 100 ug via INTRAVENOUS

## 2022-03-31 MED ORDER — HYDROMORPHONE HCL 1 MG/ML IJ SOLN
1.0000 mg | INTRAMUSCULAR | Status: DC | PRN
  Administered 2022-04-01: 1 mg via INTRAVENOUS
  Filled 2022-03-31: qty 1

## 2022-03-31 MED ORDER — VANCOMYCIN HCL IN DEXTROSE 1-5 GM/200ML-% IV SOLN
INTRAVENOUS | Status: AC
  Administered 2022-03-31: 1000 mg via INTRAVENOUS
  Filled 2022-03-31: qty 200

## 2022-03-31 MED ORDER — HYDROMORPHONE HCL 1 MG/ML IJ SOLN
0.2500 mg | INTRAMUSCULAR | Status: DC | PRN
  Administered 2022-03-31 (×3): 0.5 mg via INTRAVENOUS

## 2022-03-31 MED ORDER — LIDOCAINE 2% (20 MG/ML) 5 ML SYRINGE
INTRAMUSCULAR | Status: AC
Start: 2022-03-31 — End: ?
  Filled 2022-03-31: qty 5

## 2022-03-31 MED ORDER — METHOCARBAMOL 500 MG PO TABS
500.0000 mg | ORAL_TABLET | Freq: Four times a day (QID) | ORAL | Status: DC | PRN
  Administered 2022-03-31 – 2022-04-01 (×3): 500 mg via ORAL
  Filled 2022-03-31 (×3): qty 1

## 2022-03-31 MED ORDER — ONDANSETRON HCL 4 MG/2ML IJ SOLN
4.0000 mg | Freq: Four times a day (QID) | INTRAMUSCULAR | Status: DC | PRN
Start: 2022-03-31 — End: 2022-04-01
  Administered 2022-03-31: 4 mg via INTRAVENOUS
  Filled 2022-03-31: qty 2

## 2022-03-31 MED ORDER — LIDOCAINE-EPINEPHRINE 1 %-1:100000 IJ SOLN
INTRAMUSCULAR | Status: AC
Start: 2022-03-31 — End: ?
  Filled 2022-03-31: qty 1

## 2022-03-31 MED ORDER — ONDANSETRON HCL 4 MG/2ML IJ SOLN
INTRAMUSCULAR | Status: DC | PRN
  Administered 2022-03-31 (×2): 4 mg via INTRAVENOUS

## 2022-03-31 MED ORDER — MIDAZOLAM HCL 5 MG/5ML IJ SOLN
INTRAMUSCULAR | Status: DC | PRN
  Administered 2022-03-31: 2 mg via INTRAVENOUS

## 2022-03-31 MED ORDER — PROPOFOL 10 MG/ML IV BOLUS
INTRAVENOUS | Status: DC | PRN
  Administered 2022-03-31: 200 mg via INTRAVENOUS
  Administered 2022-03-31: 30 mg via INTRAVENOUS

## 2022-03-31 MED ORDER — HYDROMORPHONE HCL 1 MG/ML IJ SOLN
INTRAMUSCULAR | Status: DC | PRN
  Administered 2022-03-31 (×2): .5 mg via INTRAVENOUS

## 2022-03-31 MED ORDER — GABAPENTIN 300 MG PO CAPS: 300.0000 mg | ORAL_CAPSULE | Freq: Two times a day (BID) | ORAL | Status: DC | PRN

## 2022-03-31 MED ORDER — BISMUTH SUBSALICYLATE 262 MG PO CHEW: 524.0000 mg | CHEWABLE_TABLET | ORAL | Status: DC | PRN

## 2022-03-31 MED ORDER — ACETAMINOPHEN 325 MG PO TABS
650.0000 mg | ORAL_TABLET | ORAL | Status: DC | PRN
  Administered 2022-03-31 – 2022-04-01 (×2): 650 mg via ORAL
  Filled 2022-03-31 (×2): qty 2

## 2022-03-31 MED ORDER — ONDANSETRON HCL 4 MG PO TABS: 4.0000 mg | ORAL_TABLET | Freq: Every day | ORAL | Status: DC | PRN

## 2022-03-31 MED ORDER — SODIUM CHLORIDE 0.9% FLUSH: 3.0000 mL | Freq: Two times a day (BID) | INTRAVENOUS | Status: DC

## 2022-03-31 MED ORDER — VANCOMYCIN HCL IN DEXTROSE 1-5 GM/200ML-% IV SOLN: 1000.0000 mg | INTRAVENOUS | Status: AC

## 2022-03-31 MED ORDER — POLYETHYLENE GLYCOL 3350 17 G PO PACK: 17.0000 g | PACK | Freq: Every day | ORAL | Status: DC | PRN

## 2022-03-31 MED ORDER — SCOPOLAMINE 1 MG/3DAYS TD PT72
MEDICATED_PATCH | TRANSDERMAL | Status: DC | PRN
  Administered 2022-03-31: 1 via TRANSDERMAL

## 2022-03-31 MED ORDER — SODIUM CHLORIDE 0.9% FLUSH: 3.0000 mL | INTRAVENOUS | Status: DC | PRN

## 2022-03-31 MED ORDER — ACETAMINOPHEN 500 MG PO TABS
1000.0000 mg | ORAL_TABLET | Freq: Once | ORAL | Status: AC
  Administered 2022-03-31: 1000 mg via ORAL
  Filled 2022-03-31: qty 2

## 2022-03-31 MED ORDER — ROCURONIUM BROMIDE 10 MG/ML (PF) SYRINGE
PREFILLED_SYRINGE | INTRAVENOUS | Status: AC
  Filled 2022-03-31: qty 10

## 2022-03-31 SURGICAL SUPPLY — 66 items
BAG COUNTER SPONGE SURGICOUNT (BAG) ×2 IMPLANT
BASKET BONE COLLECTION (BASKET) ×2 IMPLANT
BENZOIN TINCTURE PRP APPL 2/3 (GAUZE/BANDAGES/DRESSINGS) IMPLANT
BLADE CLIPPER SURG (BLADE) IMPLANT
BLADE SURG 11 STRL SS (BLADE) ×2 IMPLANT
BUR MATCHSTICK NEURO 3.0 LAGG (BURR) ×2 IMPLANT
BUR PRECISION FLUTE 5.0 (BURR) ×2 IMPLANT
CANISTER SUCT 3000ML PPV (MISCELLANEOUS) ×2 IMPLANT
CNTNR URN SCR LID CUP LEK RST (MISCELLANEOUS) ×1 IMPLANT
CONT SPEC 4OZ STRL OR WHT (MISCELLANEOUS) ×2
COVER BACK TABLE 60X90IN (DRAPES) ×2 IMPLANT
DERMABOND ADVANCED (GAUZE/BANDAGES/DRESSINGS) ×1
DERMABOND ADVANCED .7 DNX12 (GAUZE/BANDAGES/DRESSINGS) ×1 IMPLANT
DRAPE C-ARM 42X72 X-RAY (DRAPES) IMPLANT
DRAPE C-ARMOR (DRAPES) IMPLANT
DRAPE LAPAROTOMY 100X72X124 (DRAPES) ×2 IMPLANT
DRAPE SURG 17X23 STRL (DRAPES) ×2 IMPLANT
DURAPREP 26ML APPLICATOR (WOUND CARE) ×2 IMPLANT
ELECT REM PT RETURN 9FT ADLT (ELECTROSURGICAL) ×2
ELECTRODE REM PT RTRN 9FT ADLT (ELECTROSURGICAL) ×1 IMPLANT
GAUZE 4X4 16PLY ~~LOC~~+RFID DBL (SPONGE) IMPLANT
GAUZE SPONGE 4X4 12PLY STRL (GAUZE/BANDAGES/DRESSINGS) IMPLANT
GLOVE BIOGEL PI IND STRL 7.5 (GLOVE) ×2 IMPLANT
GLOVE BIOGEL PI INDICATOR 7.5 (GLOVE) ×2
GLOVE ECLIPSE 7.5 STRL STRAW (GLOVE) ×4 IMPLANT
GLOVE EXAM NITRILE LRG STRL (GLOVE) IMPLANT
GLOVE EXAM NITRILE XL STR (GLOVE) IMPLANT
GLOVE EXAM NITRILE XS STR PU (GLOVE) IMPLANT
GOWN STRL REUS W/ TWL LRG LVL3 (GOWN DISPOSABLE) ×4 IMPLANT
GOWN STRL REUS W/ TWL XL LVL3 (GOWN DISPOSABLE) IMPLANT
GOWN STRL REUS W/TWL 2XL LVL3 (GOWN DISPOSABLE) IMPLANT
GOWN STRL REUS W/TWL LRG LVL3 (GOWN DISPOSABLE) ×8
GOWN STRL REUS W/TWL XL LVL3 (GOWN DISPOSABLE)
HEMOSTAT POWDER KIT SURGIFOAM (HEMOSTASIS) ×2 IMPLANT
KIT BASIN OR (CUSTOM PROCEDURE TRAY) ×2 IMPLANT
KIT INFUSE SMALL (Orthopedic Implant) ×1 IMPLANT
KIT POSITION SURG JACKSON T1 (MISCELLANEOUS) ×2 IMPLANT
KIT TURNOVER KIT B (KITS) ×2 IMPLANT
MILL BONE PREP (MISCELLANEOUS) ×2 IMPLANT
NDL HYPO 18GX1.5 BLUNT FILL (NEEDLE) IMPLANT
NDL SPNL 18GX3.5 QUINCKE PK (NEEDLE) IMPLANT
NEEDLE HYPO 18GX1.5 BLUNT FILL (NEEDLE) IMPLANT
NEEDLE HYPO 22GX1.5 SAFETY (NEEDLE) ×2 IMPLANT
NEEDLE SPNL 18GX3.5 QUINCKE PK (NEEDLE) IMPLANT
NS IRRIG 1000ML POUR BTL (IV SOLUTION) ×2 IMPLANT
PACK LAMINECTOMY NEURO (CUSTOM PROCEDURE TRAY) ×2 IMPLANT
PAD ARMBOARD 7.5X6 YLW CONV (MISCELLANEOUS) ×6 IMPLANT
PUTTY DBF 6CC CORTICAL FIBERS (Putty) ×1 IMPLANT
ROD 40MM SOLERA PREBENT (Rod) ×2 IMPLANT
SCREW POST SOLERA 7.5X30 F/5.5 (Screw) ×2 IMPLANT
SCREW POST SOLERA 7.5X35 F/5.5 (Screw) ×2 IMPLANT
SCREW SET SOLERA (Screw) ×8 IMPLANT
SCREW SET SOLERA TI5.5 (Screw) IMPLANT
SPIKE FLUID TRANSFER (MISCELLANEOUS) ×2 IMPLANT
SPONGE SURGIFOAM ABS GEL 100 (HEMOSTASIS) IMPLANT
SPONGE T-LAP 4X18 ~~LOC~~+RFID (SPONGE) IMPLANT
STRIP CLOSURE SKIN 1/2X4 (GAUZE/BANDAGES/DRESSINGS) IMPLANT
SUT MNCRL AB 3-0 PS2 18 (SUTURE) ×2 IMPLANT
SUT VIC AB 0 CT1 18XCR BRD8 (SUTURE) ×1 IMPLANT
SUT VIC AB 0 CT1 8-18 (SUTURE) ×2
SUT VIC AB 2-0 CP2 18 (SUTURE) ×2 IMPLANT
SYR 3ML LL SCALE MARK (SYRINGE) IMPLANT
TOWEL GREEN STERILE (TOWEL DISPOSABLE) ×2 IMPLANT
TOWEL GREEN STERILE FF (TOWEL DISPOSABLE) ×2 IMPLANT
TRAY FOLEY MTR SLVR 16FR STAT (SET/KITS/TRAYS/PACK) ×2 IMPLANT
WATER STERILE IRR 1000ML POUR (IV SOLUTION) ×2 IMPLANT

## 2022-03-31 NOTE — Transfer of Care (Signed)
Immediate Anesthesia Transfer of Care Note  Patient: [name] [name]  Procedure(s) Performed: REMOVAL AND REPLACEMENT OF LUMBAR FIVE-SACRAL ONE LUMBAR FUSION HARDWARE (Spine Lumbar)  Patient Location: PACU  Anesthesia Type:General  Level of Consciousness: awake, alert  and oriented  Airway & Oxygen Therapy: Patient Spontanous Breathing  Post-op Assessment: Report given to RN, Post -op Vital signs reviewed and stable and Patient moving all extremities X 4  Post vital signs: Reviewed and stable  Last Vitals:  Vitals Value Taken Time  BP 116/82 03/31/22 1434  Temp    Pulse 67 03/31/22 1435  Resp 11 03/31/22 1435  SpO2 97 % 03/31/22 1435  Vitals shown include unvalidated device data.  Last Pain:  Vitals:   03/31/22 0929  TempSrc:   PainSc: 7          Complications: No notable events documented.

## 2022-03-31 NOTE — Anesthesia Procedure Notes (Signed)
Procedure Name: Intubation Date/Time: 03/31/2022 12:29 PM  Performed by: Gwyndolyn Saxon, CRNAPre-anesthesia Checklist: Patient identified, Emergency Drugs available, Suction available and Patient being monitored Patient Re-evaluated:Patient Re-evaluated prior to induction Oxygen Delivery Method: Circle system utilized Preoxygenation: Pre-oxygenation with 100% oxygen Induction Type: IV induction Ventilation: Mask ventilation without difficulty Laryngoscope Size: Miller and 2 Grade View: Grade I Tube type: Oral Tube size: 7.0 mm Number of attempts: 1 Airway Equipment and Method: Stylet Placement Confirmation: ETT inserted through vocal cords under direct vision, positive ETCO2 and breath sounds checked- equal and bilateral Secured at: 21 cm Tube secured with: Tape Dental Injury: Teeth and Oropharynx as per pre-operative assessment

## 2022-03-31 NOTE — H&P (Signed)
Surgical H&P Update  HPI: 41 y.o. with a history of prior lumbar HNP w/ radicu s/p MIS discectomy x2 with recurrence, then MIS TLIF with resolution of symptoms but eventually developed LBP, CT unfortunately confirmed pseudoarthrosis. No changes in health since they were last seen. Still having the above and wishes to proceed with surgery.  PMHx:  Past Medical History:  Diagnosis Date   Boil 11/20/2014   Breast mass, right 07/16/2014   Colitis    no current problems   Fibromyalgia    GERD (gastroesophageal reflux disease)    Sebaceous cyst 11/20/2014   FamHx:  Family History  Problem Relation Age of Onset   COPD Father    Diabetes Father    Heart disease Father    Diverticulosis Father    Diabetes Sister    Leukemia Sister    Diabetes Other    Cancer Other    Thyroid disease Other    Other Mother        shingles   SocHx:  reports that she quit smoking about 14 years ago. Her smoking use included cigarettes. She has never used smokeless tobacco. She reports current alcohol use. She reports that she does not use drugs.  Physical Exam: Strength 5/5 x4 and SILTx4   Assesment/Plan: 41 y.o. woman with L5-S1 pseudoarthrosis, here for revision and posterolateral instrumented fusion. Risks, benefits, and alternatives discussed and the patient would like to continue with surgery.  -OR today -3C post-op  Judith Part, MD 03/31/22 11:41 AM

## 2022-03-31 NOTE — Op Note (Signed)
PATIENT: [name] [name]  DAY OF SURGERY: 03/31/22   PRE-OPERATIVE DIAGNOSIS:  L5-S1 pseudoarthrosis   POST-OPERATIVE DIAGNOSIS:  Same   PROCEDURE:  Revision of L5-S1 pseudoarthrosis with L5-S1 posterolateral instrumented fusion   SURGEON:  Surgeon(s) and Role:    Judith Part, MD - Primary   ANESTHESIA: ETGA   BRIEF HISTORY: This is a 41 year old woman who originally presented with RLE radicular pain due to a disc herniation. She had MIS discectomies x2 with recurrence each time, therefore proceeded with a complete discectomy and TLIF. She then unfortunately developed significant low back pain and CT showed a lack of arthrodesis in the disc space with loosening of the sacral screws. I therefore recommended open revision of the construct with replacement of loose hardware with bilateral posterolateral fusion. This was discussed with the patient as well as risks, benefits, and alternatives and wished to proceed with surgery.   OPERATIVE DETAIL: The patient was taken to the operating room, anesthesia was induced by the anesthesia team, and the patient was placed on the OR table in the prone position. A formal time out was performed with two patient identifiers and confirmed the operative site. The operative site was marked, hair was clipped with surgical clippers, the area was then prepped and draped in a sterile fashion.   The patient's bilateral MIS incisions were opened over L5-S1, starting on the right. Soft tissues were dissected and retractor placed. The rod and screws were grossly loose on the right, S1 worse than L5. The caps were removed followed by the rod and screws. The transverse processes were identified and the TPs, facets, pars, and lamina were then decorticated using a high speed drill followed by placement of rBMP and cortical allograft to complete the posterolateral fusion on the right. Both screws were then replaced with 7.44m diameter screws, upsized by 144m with very  good purchase. At S1, I was able to redirect the trajectory more superiorly with a pedicle awl and then 6.42m32map to get purchase into new bone with good purchase. A rod was then placed and secured with cap screws, fluoroscopic images showed good positioning, and the caps were final tightened and torqued to manufacturer specifications.   This technique was then repeated on the left in the same fashion. On the left, the hardware felt better but still easy to remove and therefore was replaced with 7.42mm542mrews in the same fashion, followed by decortication for a posterolateral fusion with rBMP along the surfaces along with allograft, rod placement, cap placement, fluoro shot to show good placement, and final tightening.   Both incisions were copiously irrigated, all instrument and sponge counts were correct, and the incision was then closed in layers. The patient was then returned to anesthesia for emergence. No apparent complications at the completion of the procedure.   EBL:  100mL29mRAINS: none   SPECIMENS: none   ThomaJudith Part07/17/23 12:06 PM

## 2022-03-31 NOTE — Anesthesia Postprocedure Evaluation (Signed)
Anesthesia Post Note  Patient: [name] [name]  Procedure(s) Performed: REMOVAL AND REPLACEMENT OF LUMBAR FIVE-SACRAL ONE LUMBAR FUSION HARDWARE (Spine Lumbar)     Patient location during evaluation: PACU Anesthesia Type: General Level of consciousness: awake and alert Pain management: pain level controlled Vital Signs Assessment: post-procedure vital signs reviewed and stable Respiratory status: spontaneous breathing, nonlabored ventilation and respiratory function stable Cardiovascular status: blood pressure returned to baseline and stable Postop Assessment: no apparent nausea or vomiting Anesthetic complications: no   No notable events documented.  Last Vitals:  Vitals:   03/31/22 1505 03/31/22 1520  BP: 114/90 105/71  Pulse: (!) 55 (!) 58  Resp: 15 15  Temp:  36.7 C  SpO2: 98% 99%    Last Pain:  Vitals:   03/31/22 1435  TempSrc:   PainSc: 0-No pain                 Santos Hardwick,W. EDMOND

## 2022-04-01 ENCOUNTER — Encounter (HOSPITAL_COMMUNITY): Payer: Self-pay | Admitting: Neurological Surgery

## 2022-04-01 DIAGNOSIS — M960 Pseudarthrosis after fusion or arthrodesis: Secondary | ICD-10-CM | POA: Diagnosis not present

## 2022-04-01 MED ORDER — OXYCODONE-ACETAMINOPHEN 5-325 MG PO TABS: 0.5000 | ORAL_TABLET | ORAL | 0 refills | Status: DC | PRN

## 2022-04-01 MED ORDER — OXYCODONE-ACETAMINOPHEN 5-325 MG PO TABS: 1.0000 | ORAL_TABLET | ORAL | 0 refills | Status: AC | PRN

## 2022-04-01 MED ORDER — METHOCARBAMOL 500 MG PO TABS: 500.0000 mg | ORAL_TABLET | Freq: Four times a day (QID) | ORAL | 2 refills | Status: AC | PRN

## 2022-04-01 NOTE — Plan of Care (Signed)

## 2022-04-01 NOTE — Discharge Summary (Signed)
Discharge Summary  Date of Admission: 03/31/2022  Date of Discharge: 04/01/22  Attending Physician: Emelda Brothers, MD  Hospital Course: Patient was admitted following an uncomplicated revision of an L5-S1 pseudoarthrosis. They were recovered in PACU and transferred to [facility]. Their preop symptoms were improved, their hospital course was uncomplicated and the patient was discharged home on 04/01/22. They will follow up in clinic with me in clinic in 2 weeks.  Neurologic exam at discharge:  Strength 5/5 x4 and SILTx4   Discharge diagnosis: Lumbar pseudoarthrosis  Judith Part, MD 04/01/22 7:48 AM

## 2022-04-01 NOTE — Evaluation (Signed)
Occupational Therapy Evaluation Patient Details Name: [name] [name] MRN: [id number] DOB: [birth year] Today's Date: 04/01/2022   History of Present Illness Pt is a 41 y/o female presenting on 7/17 for elective revision of L5-S1 pseudoarthrosis.  PMH includes: fibromyalgia, R breast mass, prior back surgery x 2.   Clinical Impression   PTA patient independent and working. Admitted for above and presents with problem list below, including back pain, precautions and decreased activity tolerance. Patient educated on back precautions, ADL compensatory techniques, DME/AE, recommendations and activity progression. Patient completes transfers with modified independence, ADLs with setup to min assist (increased A for LB due to inability to complete figure 4 technique), functional mobility in room with modified independence. She will have support of spouse at home as needed.  Based on performance today, no further OT needs identified and OT will sign off.      Recommendations for follow up therapy are one component of a multi-disciplinary discharge planning process, led by the attending physician.  Recommendations may be updated based on patient status, additional functional criteria and insurance authorization.   Follow Up Recommendations  No OT follow up    Assistance Recommended at Discharge PRN  Patient can return home with the following A little help with bathing/dressing/bathroom;Assistance with cooking/housework;Assist for transportation    Functional Status Assessment  Patient has had a recent decline in their functional status and demonstrates the ability to make significant improvements in function in a reasonable and predictable amount of time.  Equipment Recommendations  Tub/shower seat (plans to self purchase)    Recommendations for Other Services       Precautions / Restrictions Precautions Precautions: Back Precaution Booklet Issued: Yes (comment) Precaution Comments: reviewed  with pt Restrictions Weight Bearing Restrictions: No      Mobility Bed Mobility               General bed mobility comments: OOB upon entry, able to verbalize log roll technique    Transfers Overall transfer level: Modified independent                        Balance Overall balance assessment: Mild deficits observed, not formally tested                                         ADL either performed or assessed with clinical judgement   ADL Overall ADL's : Needs assistance/impaired     Grooming: Modified independent;Standing       Lower Body Bathing: Supervison/ safety;Sit to/from stand;With adaptive equipment;Cueing for compensatory techniques   Upper Body Dressing : Set up;Sitting   Lower Body Dressing: Minimal assistance;Cueing for compensatory techniques;Sitting/lateral leans;Sit to/from stand;Cueing for back precautions Lower Body Dressing Details (indicate cue type and reason): unable to complete figure 4 technique, educated on compensatory techniques and AE as needed.  spouse plans to assist. Toilet Transfer: Modified Independent;Ambulation   Toileting- Clothing Manipulation and Hygiene: Modified independent;Sit to/from stand Toileting - Clothing Manipulation Details (indicate cue type and reason): reviewed compensatory techniques Tub/ Shower Transfer: Walk-in shower;Supervision/safety;Ambulation   Functional mobility during ADLs: Modified independent General ADL Comments: educated on safety, precautions and compensatory techniques.     Vision         Perception     Praxis      Pertinent Vitals/Pain Pain Assessment Pain Assessment: Faces Faces Pain Scale: Hurts little more Pain Location:  incisional back Pain Descriptors / Indicators: Discomfort, Grimacing, Operative site guarding Pain Intervention(s): Limited activity within patient's tolerance, Monitored during session, Repositioned     Hand Dominance      Extremity/Trunk Assessment Upper Extremity Assessment Upper Extremity Assessment: Overall WFL for tasks assessed   Lower Extremity Assessment Lower Extremity Assessment: Overall WFL for tasks assessed   Cervical / Trunk Assessment Cervical / Trunk Assessment: Back Surgery   Communication Communication Communication: No difficulties   Cognition Arousal/Alertness: Awake/alert Behavior During Therapy: WFL for tasks assessed/performed Overall Cognitive Status: Within Functional Limits for tasks assessed                                       General Comments  spouse present and supportive, educated on shower chair recommendations    Exercises     Shoulder Instructions      Home Living Family/patient expects to be discharged to:: Private residence Living Arrangements: Spouse/significant other;Children Available Help at Discharge: Family;Available 24 hours/day Type of Home: House Home Access: Ramped entrance     Home Layout: One level     Bathroom Shower/Tub: Occupational psychologist: Standard     Home Equipment: None          Prior Functioning/Environment Prior Level of Function : Independent/Modified Independent;Working/employed;Driving                        OT Problem List: Decreased activity tolerance;Decreased knowledge of use of DME or AE;Decreased knowledge of precautions;Pain      OT Treatment/Interventions:      OT Goals(Current goals can be found in the care plan section) Acute Rehab OT Goals Patient Stated Goal: home OT Goal Formulation: With patient  OT Frequency:      Co-evaluation              AM-PAC OT "6 Clicks" Daily Activity     Outcome Measure Help from another person eating meals?: None Help from another person taking care of personal grooming?: None Help from another person toileting, which includes using toliet, bedpan, or urinal?: None Help from another person bathing (including washing,  rinsing, drying)?: A Little Help from another person to put on and taking off regular upper body clothing?: None Help from another person to put on and taking off regular lower body clothing?: A Little 6 Click Score: 22   End of Session Nurse Communication: Mobility status;Precautions  Activity Tolerance: Patient tolerated treatment well Patient left: in chair;with call bell/phone within reach;with family/visitor present  OT Visit Diagnosis: Other abnormalities of gait and mobility (R26.89);Pain Pain - part of body:  (back)                Time: 3419-3790 OT Time Calculation (min): 18 min Charges:  OT General Charges $OT Visit: 1 Visit OT Evaluation $OT Eval Low Complexity: [place], OT Acute Rehabilitation Services Office [phone]   Delight Stare 04/01/2022, 9:46 AM

## 2022-04-01 NOTE — Progress Notes (Signed)
PT Cancellation Note  Patient Details Name: [name] [name] MRN: [id number] DOB: 07/29/1981   Cancelled Treatment:    Reason Eval/Treat Not Completed: PT screened, no needs identified, will sign off  Patient seen by OT with no PT needs identified.    Arby Barrette, PT Acute Rehabilitation Services  Office [phone]  Rexanne Mano 04/01/2022, 9:20 AM

## 2022-04-01 NOTE — Progress Notes (Signed)
Neurosurgery Service Progress Note  Subjective: No acute events overnight, no radicular pain, internal back pain improved from preop, +incisional pain but better than last time   Objective: Vitals:   03/31/22 1552 03/31/22 2031 03/31/22 2336 04/01/22 0347  BP: 134/79 117/75 125/76 108/75  Pulse: 60 (!) 58 78 73  Resp: '18 18 18 18  '$ Temp: 97.9 F (36.6 C) 98.5 F (36.9 C) 98.2 F (36.8 C) 97.9 F (36.6 C)  TempSrc: Oral Oral Oral Oral  SpO2: 100% 100% 100% 99%  Weight:      Height:        Physical Exam: Strength 5/5 x4 and SILTx4   Assessment & Plan: 41 y.o. woman s/p revision of L5-S1 pseudoarthrosis, recovering well.  -discharge home today  Judith Part  04/01/22 7:44 AM

## 2022-04-01 NOTE — Progress Notes (Signed)
Patient alert and oriented, voiding adequately, skin clean, dry and intact without evidence of skin break down, or symptoms of complications - no redness or edema noted, only slight tenderness at site.  Patient states pain is manageable at time of discharge. Patient has an appointment with MD in 2 weeks 

## 2022-04-03 ENCOUNTER — Encounter (HOSPITAL_COMMUNITY): Payer: Self-pay | Admitting: Neurological Surgery

## 2022-11-12 ENCOUNTER — Other Ambulatory Visit: Payer: Self-pay

## 2022-11-12 DIAGNOSIS — R202 Paresthesia of skin: Secondary | ICD-10-CM

## 2022-11-13 ENCOUNTER — Ambulatory Visit (INDEPENDENT_AMBULATORY_CARE_PROVIDER_SITE_OTHER): Payer: 59 | Admitting: Neurology

## 2022-11-13 DIAGNOSIS — R202 Paresthesia of skin: Secondary | ICD-10-CM | POA: Diagnosis not present

## 2022-11-13 NOTE — Procedures (Signed)
  [facility]  [address]  [place], [place] [address] Tel: [phone] Fax: [phone] Test Date:  11/13/2022  Patient: [name] [name] DOB: May 20, 1981 Physician: Narda Amber, DO  Sex: Female Height: 5' 2"$  Ref Phys: Emelda Brothers, MD  ID#: [id number]   Technician:    History: This is a 42 year old female with history of L5-S1 fusion referred for evaluation of right leg pain.  NCV & EMG Findings: Extensive electrodiagnostic testing of the right lower extremity shows:  Right sural and superficial peroneal sensory responses are within normal limits. Right peroneal and tibial motor responses are within normal limits. Right tibial H reflex study is within normal limits. There is no evidence of active or chronic motor axonal loss changes affecting any of the tested muscles.  Motor unit configuration and recruitment pattern is within normal limits.  Impression: This is a normal study of the right lower extremity.  In particular, there is no evidence of a large fiber sensorimotor polyneuropathy or lumbosacral radiculopathy.    ___________________________ Narda Amber, DO    Nerve Conduction Studies   Stim Site NR Peak (ms) Norm Peak (ms) O-P Amp (V) Norm O-P Amp  Right Sup Peroneal Anti Sensory (Ant Lat Mall)  32 C  12 cm    1.8 <4.5 27.1 >5  Right Sural Anti Sensory (Lat Mall)  32 C  Calf    2.6 <4.5 25.7 >5     Stim Site NR Onset (ms) Norm Onset (ms) O-P Amp (mV) Norm O-P Amp Site1 Site2 Delta-0 (ms) Dist (cm) Vel (m/s) Norm Vel (m/s)  Right Peroneal Motor (Ext Dig Brev)  32 C  Ankle    2.3 <5.5 7.1 >3 B Fib Ankle 6.4 35.0 55 >40  B Fib    8.7  6.8  Poplt B Fib 1.5 8.0 53 >40  Poplt    10.2  6.6         Right Tibial Motor (Abd Hall Brev)  32 C  Ankle    2.7 <6.0 16.9 >8 Knee Ankle 7.4 39.0 53 >40  Knee    10.1  12.3          Electromyography   Side Muscle Ins.Act Fibs Fasc Recrt Amp Dur Poly Activation Comment  Right AntTibialis Nml  Nml Nml Nml Nml Nml Nml Nml N/A  Right Gastroc Nml Nml Nml Nml Nml Nml Nml Nml N/A  Right Flex Dig Long Nml Nml Nml Nml Nml Nml Nml Nml N/A  Right RectFemoris Nml Nml Nml Nml Nml Nml Nml Nml N/A  Right BicepsFemS Nml Nml Nml Nml Nml Nml Nml Nml N/A  Right GluteusMed Nml Nml Nml Nml Nml Nml Nml Nml N/A      Waveforms:
